# Patient Record
Sex: Male | Born: 1945 | Race: White | Hispanic: No | Marital: Single | Smoking: Former smoker
Health system: Southern US, Community
[De-identification: ages and names within clinical notes are randomized; demographics above are authoritative.]

## PROBLEM LIST (undated history)

## (undated) DIAGNOSIS — I10 Essential (primary) hypertension: Secondary | ICD-10-CM

## (undated) DIAGNOSIS — G8929 Other chronic pain: Secondary | ICD-10-CM

## (undated) DIAGNOSIS — N400 Enlarged prostate without lower urinary tract symptoms: Secondary | ICD-10-CM

## (undated) DIAGNOSIS — F1010 Alcohol abuse, uncomplicated: Secondary | ICD-10-CM

## (undated) DIAGNOSIS — D509 Iron deficiency anemia, unspecified: Secondary | ICD-10-CM

## (undated) DIAGNOSIS — Z978 Presence of other specified devices: Secondary | ICD-10-CM

## (undated) DIAGNOSIS — F419 Anxiety disorder, unspecified: Secondary | ICD-10-CM

## (undated) DIAGNOSIS — Z87898 Personal history of other specified conditions: Secondary | ICD-10-CM

## (undated) DIAGNOSIS — K219 Gastro-esophageal reflux disease without esophagitis: Secondary | ICD-10-CM

## (undated) DIAGNOSIS — K4091 Unilateral inguinal hernia, without obstruction or gangrene, recurrent: Secondary | ICD-10-CM

## (undated) DIAGNOSIS — I2510 Atherosclerotic heart disease of native coronary artery without angina pectoris: Secondary | ICD-10-CM

## (undated) DIAGNOSIS — Z960 Presence of urogenital implants: Secondary | ICD-10-CM

## (undated) DIAGNOSIS — G4700 Insomnia, unspecified: Secondary | ICD-10-CM

## (undated) DIAGNOSIS — T4145XA Adverse effect of unspecified anesthetic, initial encounter: Secondary | ICD-10-CM

## (undated) DIAGNOSIS — R339 Retention of urine, unspecified: Secondary | ICD-10-CM

## (undated) DIAGNOSIS — M549 Dorsalgia, unspecified: Secondary | ICD-10-CM

## (undated) DIAGNOSIS — I5042 Chronic combined systolic (congestive) and diastolic (congestive) heart failure: Secondary | ICD-10-CM

## (undated) DIAGNOSIS — I480 Paroxysmal atrial fibrillation: Secondary | ICD-10-CM

## (undated) DIAGNOSIS — I429 Cardiomyopathy, unspecified: Secondary | ICD-10-CM

## (undated) DIAGNOSIS — R911 Solitary pulmonary nodule: Secondary | ICD-10-CM

## (undated) DIAGNOSIS — I4510 Unspecified right bundle-branch block: Secondary | ICD-10-CM

## (undated) HISTORY — PX: CARDIOVASCULAR STRESS TEST: SHX262

## (undated) HISTORY — DX: Iron deficiency anemia, unspecified: D50.9

## (undated) HISTORY — PX: CATARACT EXTRACTION W/ INTRAOCULAR LENS  IMPLANT, BILATERAL: SHX1307

---

## 1983-12-19 HISTORY — PX: OTHER SURGICAL HISTORY: SHX169

## 1998-12-18 HISTORY — PX: CARDIAC CATHETERIZATION: SHX172

## 1999-07-29 ENCOUNTER — Ambulatory Visit (HOSPITAL_COMMUNITY): Admission: RE | Admit: 1999-07-29 | Discharge: 1999-07-29 | Payer: Self-pay | Admitting: Family Medicine

## 1999-07-29 ENCOUNTER — Encounter: Payer: Self-pay | Admitting: Family Medicine

## 1999-09-10 ENCOUNTER — Inpatient Hospital Stay (HOSPITAL_COMMUNITY): Admission: RE | Admit: 1999-09-10 | Discharge: 1999-09-11 | Payer: Self-pay | Admitting: Cardiovascular Disease

## 2003-01-30 ENCOUNTER — Encounter: Payer: Self-pay | Admitting: Family Medicine

## 2003-01-30 ENCOUNTER — Ambulatory Visit (HOSPITAL_COMMUNITY): Admission: RE | Admit: 2003-01-30 | Discharge: 2003-01-30 | Payer: Self-pay | Admitting: Family Medicine

## 2005-12-18 DIAGNOSIS — T8859XA Other complications of anesthesia, initial encounter: Secondary | ICD-10-CM

## 2005-12-18 DIAGNOSIS — Z87898 Personal history of other specified conditions: Secondary | ICD-10-CM

## 2005-12-18 HISTORY — DX: Other complications of anesthesia, initial encounter: T88.59XA

## 2005-12-18 HISTORY — DX: Personal history of other specified conditions: Z87.898

## 2006-07-19 ENCOUNTER — Ambulatory Visit (HOSPITAL_COMMUNITY): Admission: RE | Admit: 2006-07-19 | Discharge: 2006-07-19 | Payer: Self-pay | Admitting: Surgery

## 2006-07-19 HISTORY — PX: INGUINAL HERNIA REPAIR: SUR1180

## 2006-07-20 ENCOUNTER — Emergency Department (HOSPITAL_COMMUNITY): Admission: EM | Admit: 2006-07-20 | Discharge: 2006-07-21 | Payer: Self-pay | Admitting: Emergency Medicine

## 2006-11-16 ENCOUNTER — Encounter: Admission: RE | Admit: 2006-11-16 | Discharge: 2006-11-16 | Payer: Self-pay | Admitting: Surgery

## 2006-11-16 IMAGING — CT CT ABDOMEN W/ CM
2 of 5 series · 17 of 46 positions shown, 19 images · IV contrast (READICAT/WATER & [ID] OMNI 300)
Comparison: None.

CLINICAL DATA: Abdominal pain.  Evaluate for recurrent hernia.  
 ABDOMEN CT WITH CONTRAST:
TECHNIQUE: Multidetector CT imaging of the abdomen was performed following the standard protocol during bolus administration of intravenous contrast.
 Contrast:  100 cc Omnipaque 300
TECHNIQUE: Multidetector CT imaging of the pelvis was performed following the standard protocol during bolus administration of intravenous contrast.

[Series 3: routine abdomen · axial · 0.70mm/px · z∈[-294,+21]mm · 14 of 71 slices shown, 16 images]
[im 4/71  soft-tissue]
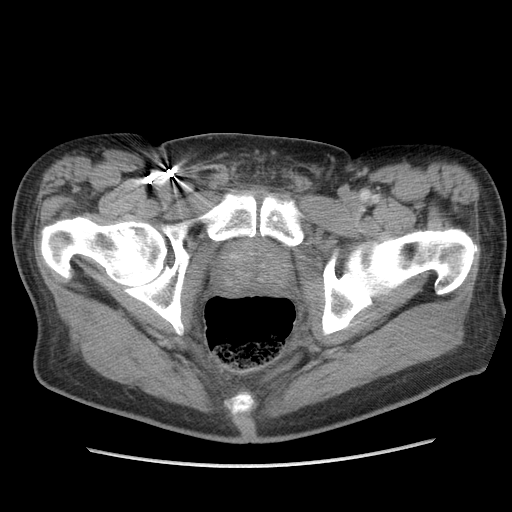
[im 4/71  bone]
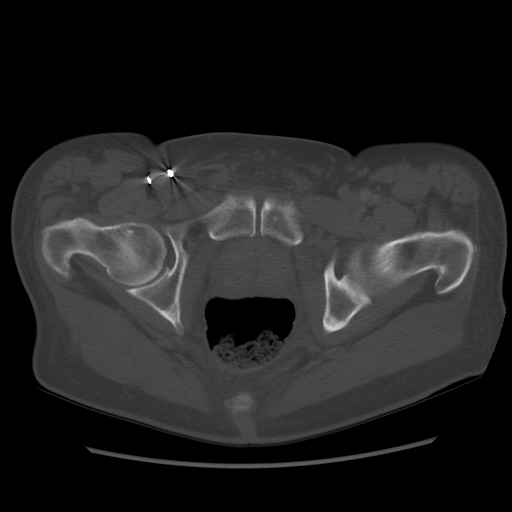
[im 8/71  soft-tissue]
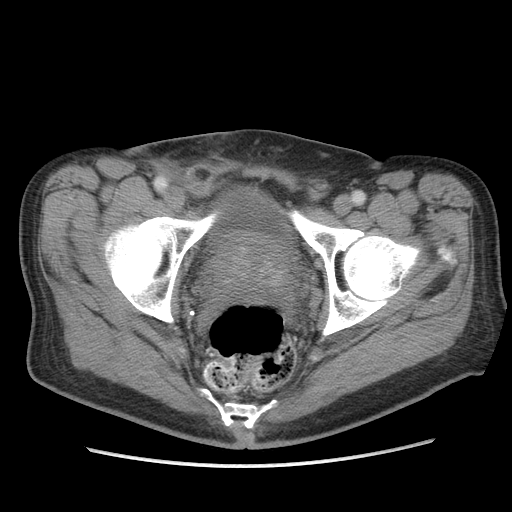
[im 15/71  soft-tissue]
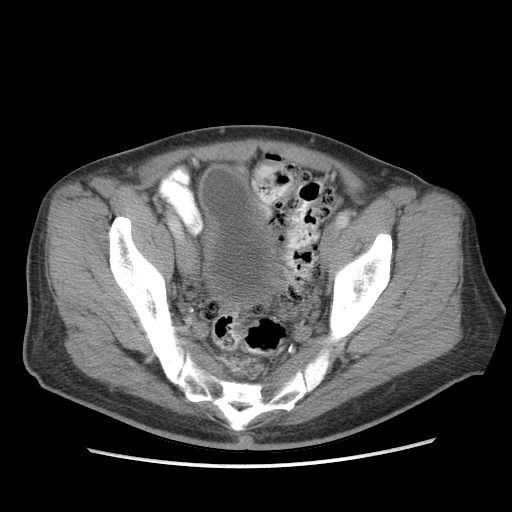
[im 19/71  soft-tissue]
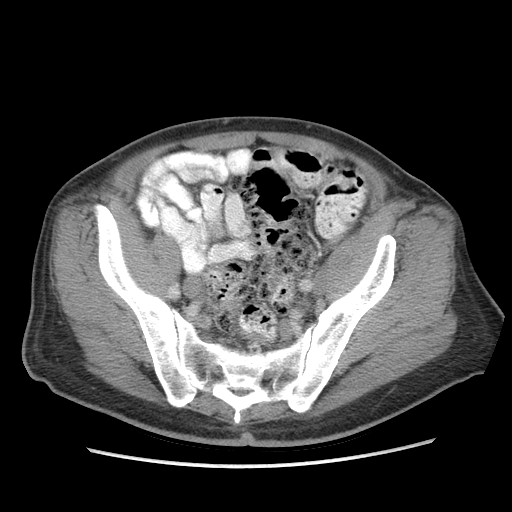
[im 23/71  soft-tissue]
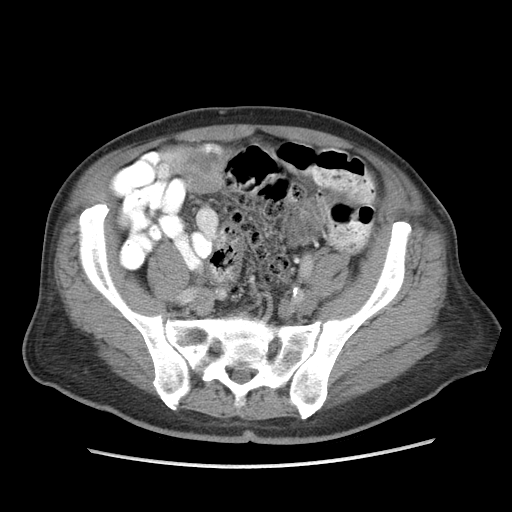
[im 30/71  soft-tissue]
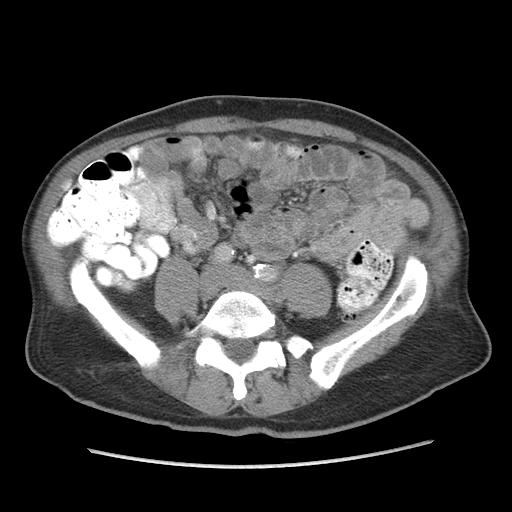
[im 34/71  soft-tissue]
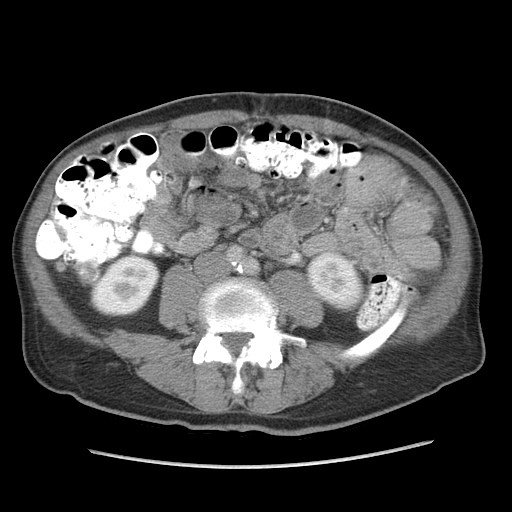
[im 37/71  soft-tissue]
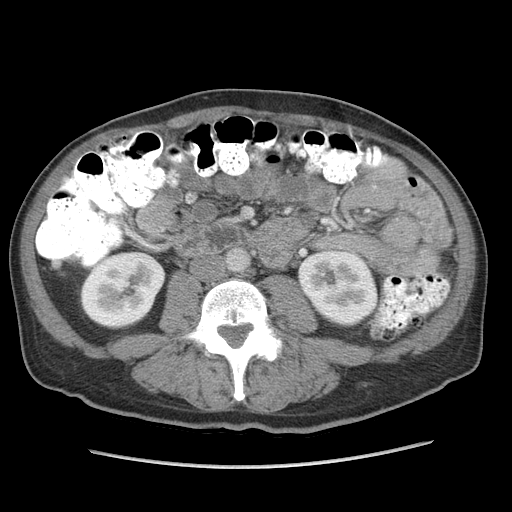
[im 41/71  soft-tissue]
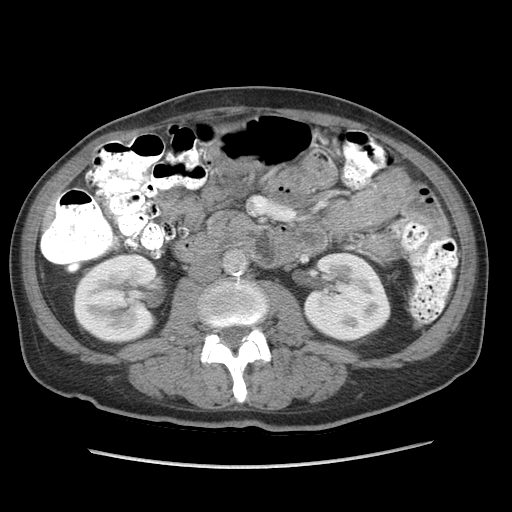
[im 41/71  bone]
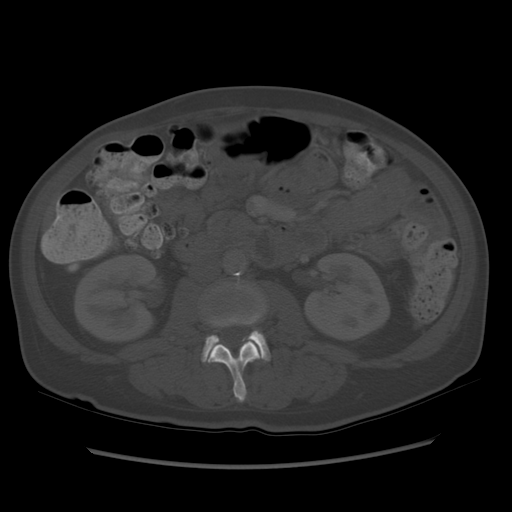
[im 48/71  soft-tissue]
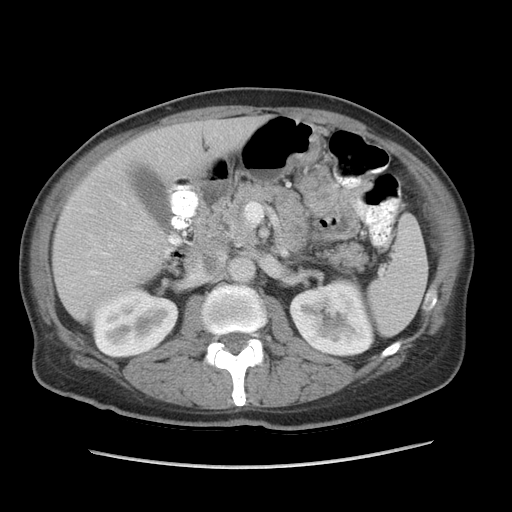
[im 52/71  soft-tissue]
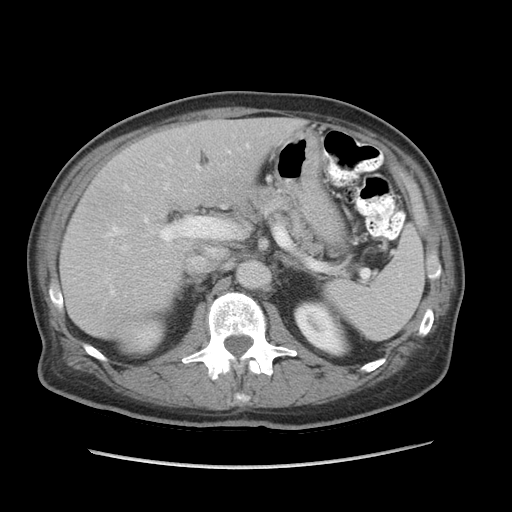
[im 56/71  soft-tissue]
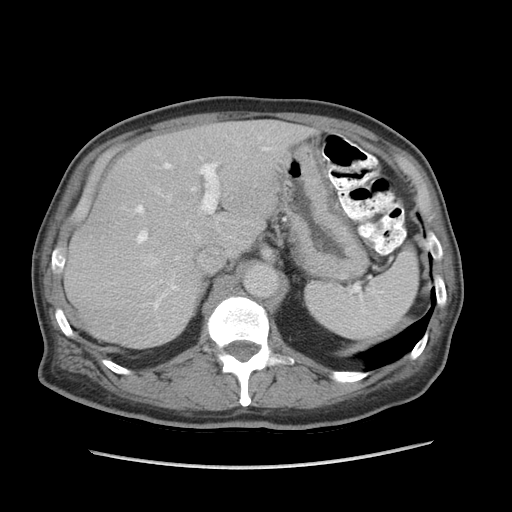
[im 63/71  soft-tissue]
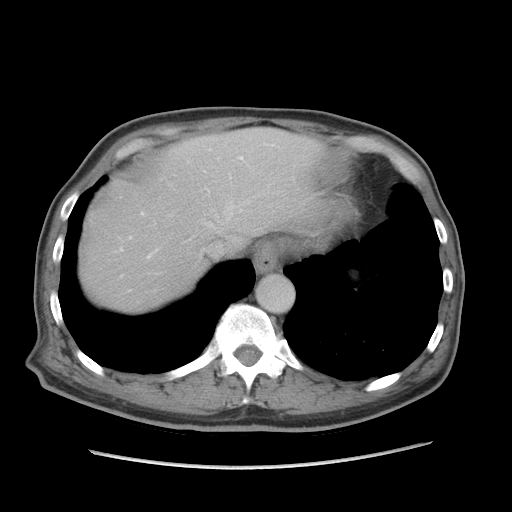
[im 67/71  soft-tissue]
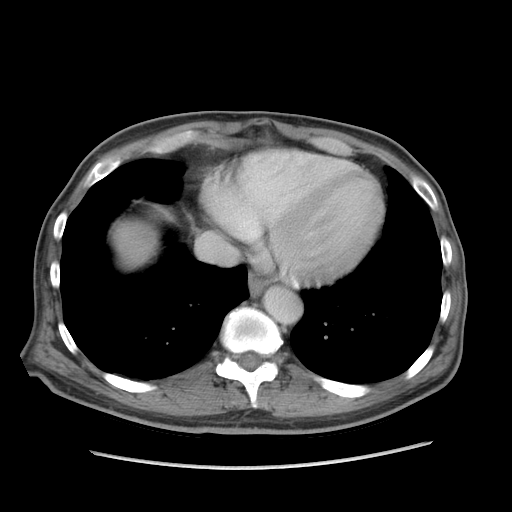

[Series 602: sagittal body · sagittal · 0.76mm/px · 3 of 143 slices shown]
[im 48/143  soft-tissue]
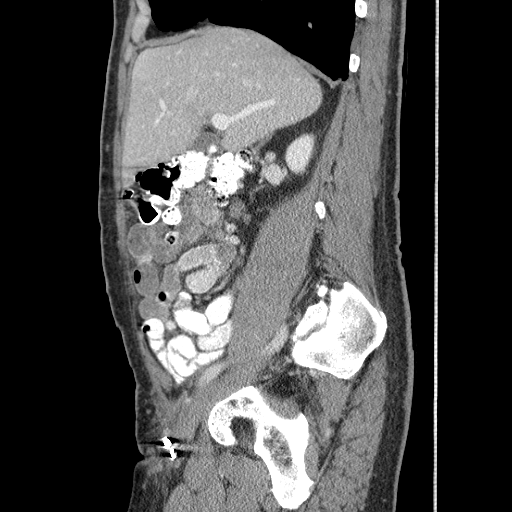
[im 64/143  soft-tissue]
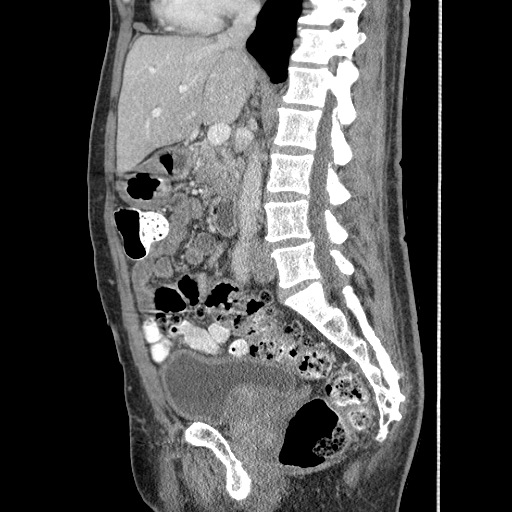
[im 79/143  soft-tissue]
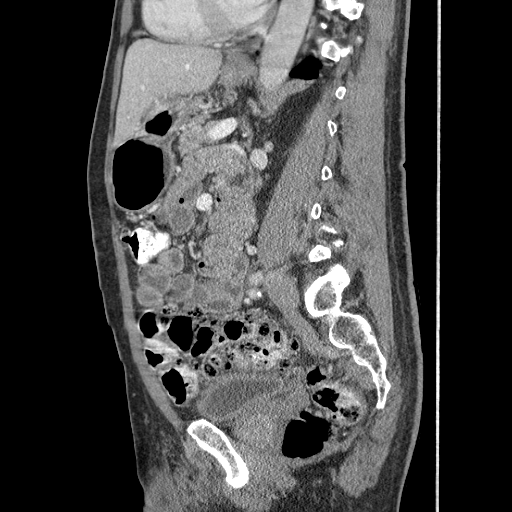

[17 of 46 positions shown; findings below may reference images not displayed]

FINDINGS: Bilateral Bochdalek hernias are noted in the diaphragm posteriorly.  The liver, spleen, pancreas, and kidneys are normal.  The patient is constipated and has diverticulosis.  No acute bowel abnormality is seen.  The appendix is normal.
IMPRESSION: 1.  Bilateral Bochdalek hernias of the diaphragm.  
 2.  Constipation. 
 3.  Diverticulosis of the colon. 
 PELVIS CT WITH CONTRAST:
FINDINGS: The prostate is markedly enlarged measuring 4.4 x 6.6 cm with a large amount of prostate extending into the urinary bladder.  The bladder wall is moderately thickened due to bladder outlet obstruction.  There is no free fluid, mass, or adenopathy.  The bowel is nondilated. 
 Surgical clips are present in the right groin around the common femoral artery.  There is some scar tissue in this area.  Medial to this area there is an inguinal hernia on the right containing abdominal fat.
IMPRESSION: 1.  Right inguinal hernia. 
 2.  Marked prostate enlargement and chronic bladder outlet obstruction.

## 2006-12-03 ENCOUNTER — Ambulatory Visit (HOSPITAL_BASED_OUTPATIENT_CLINIC_OR_DEPARTMENT_OTHER): Admission: RE | Admit: 2006-12-03 | Discharge: 2006-12-03 | Payer: Self-pay | Admitting: Surgery

## 2006-12-03 HISTORY — PX: OTHER SURGICAL HISTORY: SHX169

## 2012-02-06 ENCOUNTER — Other Ambulatory Visit: Payer: Self-pay | Admitting: Orthopedic Surgery

## 2012-03-05 ENCOUNTER — Encounter (HOSPITAL_BASED_OUTPATIENT_CLINIC_OR_DEPARTMENT_OTHER): Payer: Self-pay | Admitting: *Deleted

## 2012-03-06 ENCOUNTER — Encounter (HOSPITAL_BASED_OUTPATIENT_CLINIC_OR_DEPARTMENT_OTHER): Payer: Self-pay | Admitting: *Deleted

## 2012-03-06 NOTE — Progress Notes (Signed)
NPO AFTER MN. ARRIVES AT 1000. NEEDS HG AND EKG. WILL TAKE DIAZEPAM AM OF SURG. W/ SIP OF WATER.

## 2012-03-12 NOTE — H&P (Signed)
  CC- Adrian Ramos is a 66 y.o. male who presents with left knee pain.  HPI- . Knee Pain: Patient presents with knee pain involving the  left knee. Onset of the symptoms was several months ago. Inciting event: injured during a fall while walking. Current symptoms include giving out, pain located medially and stiffness. Pain is aggravated by going up and down stairs, kneeling, rising after sitting and squatting.  Patient has had no prior knee problems. Evaluation to date: MRI: abnormal medial meniscalmtear. Treatment to date: none.  Past Medical History  Diagnosis Date  . Acute medial meniscal injury of knee LEFT  . Anxiety disorder   . Insomnia   . BPH (benign prostatic hypertrophy)   . History of urinary retention 2007-  POST OP  (BPH W/ BLADDER OUTLET OBSTRUCTION)  . SOB (shortness of breath) on exertion   . Complication of anesthesia 2007 POST-OP URINARY RETENTION    Past Surgical History  Procedure Date  . Repair recurrent right inguinal hernia 12-03-2006  . Inguinal hernia repair 07-19-2006    RIGHT  . Benign penile growth removed 1985  . Cardiac catheterization 2000    NORMAL  (FALSE POSITIVE STRESS TEST)      Prior to Admission medications   Medication Sig Start Date End Date Taking? Authorizing Provider  diazepam (VALIUM) 10 MG tablet Take 20 mg by mouth 4 (four) times daily.   Yes Historical Provider, MD  finasteride (PROSCAR) 5 MG tablet Take 5 mg by mouth every evening.   Yes Historical Provider, MD  Tamsulosin HCl (FLOMAX) 0.4 MG CAPS Take 0.4 mg by mouth daily after supper.   Yes Historical Provider, MD  traZODone (DESYREL) 100 MG tablet Take 100 mg by mouth at bedtime.   Yes Historical Provider, MD   KNEE EXAM antalgic gait, soft tissue tenderness over medial joint line, negative drawer sign, collateral ligaments intact, normal ipsilateral hip exam  Physical Examination: General appearance - alert, well appearing, and in no distress Mental status - alert, oriented  to person, place, and time Chest - clear to auscultation, no wheezes, rales or rhonchi, symmetric air entry Heart - normal rate, regular rhythm, normal S1, S2, no murmurs, rubs, clicks or gallops Abdomen - soft, nontender, nondistended, no masses or organomegaly Neurological - alert, oriented, normal speech, no focal findings or movement disorder noted   Asessment/Plan--- Left knee medial meniscal tear- - Plan left knee arthroscopy with meniscal debridement. Procedure risks and potential comps discussed with patient who elects to proceed. Goals are decreased pain and increased function with a high likelihood of achieving both

## 2012-03-13 ENCOUNTER — Encounter (HOSPITAL_BASED_OUTPATIENT_CLINIC_OR_DEPARTMENT_OTHER): Payer: Self-pay | Admitting: Anesthesiology

## 2012-03-13 ENCOUNTER — Ambulatory Visit (HOSPITAL_BASED_OUTPATIENT_CLINIC_OR_DEPARTMENT_OTHER)
Admission: RE | Admit: 2012-03-13 | Discharge: 2012-03-13 | Disposition: A | Discharge status: Home / Self Care | Payer: Medicare Other | Source: Ambulatory Visit | Attending: Orthopedic Surgery | Admitting: Orthopedic Surgery

## 2012-03-13 ENCOUNTER — Other Ambulatory Visit: Payer: Self-pay

## 2012-03-13 ENCOUNTER — Encounter (HOSPITAL_BASED_OUTPATIENT_CLINIC_OR_DEPARTMENT_OTHER)
Admission: RE | Disposition: A | Discharge status: Home / Self Care | Payer: Self-pay | Source: Ambulatory Visit | Attending: Orthopedic Surgery

## 2012-03-13 ENCOUNTER — Encounter (HOSPITAL_BASED_OUTPATIENT_CLINIC_OR_DEPARTMENT_OTHER): Payer: Self-pay | Admitting: *Deleted

## 2012-03-13 ENCOUNTER — Ambulatory Visit (HOSPITAL_BASED_OUTPATIENT_CLINIC_OR_DEPARTMENT_OTHER): Payer: Medicare Other | Admitting: Anesthesiology

## 2012-03-13 DIAGNOSIS — IMO0002 Reserved for concepts with insufficient information to code with codable children: Secondary | ICD-10-CM | POA: Insufficient documentation

## 2012-03-13 DIAGNOSIS — W19XXXA Unspecified fall, initial encounter: Secondary | ICD-10-CM | POA: Insufficient documentation

## 2012-03-13 DIAGNOSIS — Z79899 Other long term (current) drug therapy: Secondary | ICD-10-CM | POA: Insufficient documentation

## 2012-03-13 DIAGNOSIS — Y9301 Activity, walking, marching and hiking: Secondary | ICD-10-CM | POA: Insufficient documentation

## 2012-03-13 DIAGNOSIS — S83249A Other tear of medial meniscus, current injury, unspecified knee, initial encounter: Secondary | ICD-10-CM | POA: Diagnosis present

## 2012-03-13 HISTORY — DX: Insomnia, unspecified: G47.00

## 2012-03-13 HISTORY — DX: Adverse effect of unspecified anesthetic, initial encounter: T41.45XA

## 2012-03-13 HISTORY — DX: Personal history of other specified conditions: Z87.898

## 2012-03-13 HISTORY — PX: KNEE ARTHROSCOPY: SHX127

## 2012-03-13 SURGERY — ARTHROSCOPY, KNEE
Anesthesia: General | Site: Knee | Laterality: Left | Wound class: Clean

## 2012-03-13 MED ORDER — CEFAZOLIN SODIUM-DEXTROSE 2-3 GM-% IV SOLR
2.0000 g | INTRAVENOUS | Status: AC
  Administered 2012-03-13: 2 g via INTRAVENOUS

## 2012-03-13 MED ORDER — DEXAMETHASONE SODIUM PHOSPHATE 10 MG/ML IJ SOLN: 10.0000 mg | Freq: Once | INTRAMUSCULAR | Status: DC

## 2012-03-13 MED ORDER — LIDOCAINE HCL (CARDIAC) 20 MG/ML IV SOLN
INTRAVENOUS | Status: DC | PRN
  Administered 2012-03-13: 50 mg via INTRAVENOUS

## 2012-03-13 MED ORDER — SODIUM CHLORIDE 0.9 % IR SOLN
Status: DC | PRN
  Administered 2012-03-13: 8500 mL

## 2012-03-13 MED ORDER — FENTANYL CITRATE 0.05 MG/ML IJ SOLN
INTRAMUSCULAR | Status: DC | PRN
  Administered 2012-03-13 (×2): 50 ug via INTRAVENOUS

## 2012-03-13 MED ORDER — ONDANSETRON HCL 4 MG/2ML IJ SOLN
INTRAMUSCULAR | Status: DC | PRN
  Administered 2012-03-13: 4 mg via INTRAVENOUS

## 2012-03-13 MED ORDER — KETOROLAC TROMETHAMINE 30 MG/ML IJ SOLN
INTRAMUSCULAR | Status: DC | PRN
  Administered 2012-03-13: 30 mg via INTRAVENOUS

## 2012-03-13 MED ORDER — PROPOFOL 10 MG/ML IV EMUL
INTRAVENOUS | Status: DC | PRN
  Administered 2012-03-13: 200 mg via INTRAVENOUS

## 2012-03-13 MED ORDER — OXYCODONE-ACETAMINOPHEN 5-325 MG PO TABS: 1.0000 | ORAL_TABLET | ORAL | Status: AC | PRN

## 2012-03-13 MED ORDER — MIDAZOLAM HCL 5 MG/5ML IJ SOLN
INTRAMUSCULAR | Status: DC | PRN
  Administered 2012-03-13: 2 mg via INTRAVENOUS

## 2012-03-13 MED ORDER — LACTATED RINGERS IV SOLN
INTRAVENOUS | Status: DC
  Administered 2012-03-13: 11:00:00 via INTRAVENOUS

## 2012-03-13 MED ORDER — FENTANYL CITRATE 0.05 MG/ML IJ SOLN
25.0000 ug | INTRAMUSCULAR | Status: DC | PRN
  Administered 2012-03-13: 25 ug via INTRAVENOUS

## 2012-03-13 MED ORDER — BUPIVACAINE-EPINEPHRINE 0.25% -1:200000 IJ SOLN
INTRAMUSCULAR | Status: DC | PRN
  Administered 2012-03-13: 20 mL

## 2012-03-13 MED ORDER — CHLORHEXIDINE GLUCONATE 4 % EX LIQD: 60.0000 mL | Freq: Once | CUTANEOUS | Status: DC

## 2012-03-13 MED ORDER — ACETAMINOPHEN 10 MG/ML IV SOLN
1000.0000 mg | Freq: Once | INTRAVENOUS | Status: AC
  Administered 2012-03-13: 1000 mg via INTRAVENOUS

## 2012-03-13 MED ORDER — PROMETHAZINE HCL 25 MG/ML IJ SOLN: 6.2500 mg | INTRAMUSCULAR | Status: DC | PRN

## 2012-03-13 MED ORDER — SODIUM CHLORIDE 0.9 % IV SOLN: INTRAVENOUS | Status: DC

## 2012-03-13 MED ORDER — METHOCARBAMOL 500 MG PO TABS: 500.0000 mg | ORAL_TABLET | Freq: Four times a day (QID) | ORAL | Status: AC

## 2012-03-13 MED ORDER — DEXAMETHASONE SODIUM PHOSPHATE 4 MG/ML IJ SOLN
INTRAMUSCULAR | Status: DC | PRN
  Administered 2012-03-13: 10 mg via INTRAVENOUS

## 2012-03-13 SURGICAL SUPPLY — 35 items
BANDAGE ELASTIC 6 VELCRO ST LF (GAUZE/BANDAGES/DRESSINGS) ×2 IMPLANT
BLADE 4.2CUDA (BLADE) ×2 IMPLANT
BLADE CUDA SHAVER 3.5 (BLADE) IMPLANT
BLADE CUTTER GATOR 3.5 (BLADE) IMPLANT
CANISTER SUCT LVC 12 LTR MEDI- (MISCELLANEOUS) ×2 IMPLANT
CANISTER SUCTION 2500CC (MISCELLANEOUS) IMPLANT
CLOTH BEACON ORANGE TIMEOUT ST (SAFETY) ×2 IMPLANT
DRAPE ARTHROSCOPY W/POUCH 114 (DRAPES) ×2 IMPLANT
DRSG EMULSION OIL 3X3 NADH (GAUZE/BANDAGES/DRESSINGS) ×2 IMPLANT
DURAPREP 26ML APPLICATOR (WOUND CARE) ×2 IMPLANT
ELECT MENISCUS 165MM 90D (ELECTRODE) IMPLANT
ELECT REM PT RETURN 9FT ADLT (ELECTROSURGICAL) ×2
ELECTRODE REM PT RTRN 9FT ADLT (ELECTROSURGICAL) IMPLANT
GLOVE BIO SURGEON STRL SZ8 (GLOVE) ×2 IMPLANT
GLOVE BIOGEL PI IND STRL 6.5 (GLOVE) IMPLANT
GLOVE BIOGEL PI INDICATOR 6.5 (GLOVE) ×2
GLOVE ECLIPSE 6.5 STRL STRAW (GLOVE) ×2 IMPLANT
GLOVE INDICATOR 8.0 STRL GRN (GLOVE) ×2 IMPLANT
GOWN PREVENTION PLUS LG XLONG (DISPOSABLE) ×5 IMPLANT
IV NS IRRIG 3000ML ARTHROMATIC (IV SOLUTION) ×2 IMPLANT
KNEE WRAP E Z 3 GEL PACK (MISCELLANEOUS) ×2 IMPLANT
PACK ARTHROSCOPY DSU (CUSTOM PROCEDURE TRAY) ×2 IMPLANT
PACK BASIN DAY SURGERY FS (CUSTOM PROCEDURE TRAY) ×2 IMPLANT
PADDING CAST ABS 4INX4YD NS (CAST SUPPLIES) ×1
PADDING CAST ABS COTTON 4X4 ST (CAST SUPPLIES) ×1 IMPLANT
PADDING CAST COTTON 6X4 STRL (CAST SUPPLIES) ×2 IMPLANT
PENCIL BUTTON HOLSTER BLD 10FT (ELECTRODE) IMPLANT
SET ARTHROSCOPY TUBING (MISCELLANEOUS) ×2
SET ARTHROSCOPY TUBING LN (MISCELLANEOUS) ×1 IMPLANT
SPONGE GAUZE 4X4 12PLY (GAUZE/BANDAGES/DRESSINGS) ×2 IMPLANT
SUT ETHILON 4 0 PS 2 18 (SUTURE) ×2 IMPLANT
TOWEL OR 17X24 6PK STRL BLUE (TOWEL DISPOSABLE) ×2 IMPLANT
WAND 30 DEG SABER W/CORD (SURGICAL WAND) IMPLANT
WAND 90 DEG TURBOVAC W/CORD (SURGICAL WAND) ×1 IMPLANT
WATER STERILE IRR 500ML POUR (IV SOLUTION) ×2 IMPLANT

## 2012-03-13 NOTE — Op Note (Signed)
Preoperative diagnosis-  Left knee medial meniscal tear  Postoperative diagnosis Left- knee medial meniscal tear   Procedure- Left knee arthroscopy with medial  Meniscal debridement    Surgeon- Adrian Rankin. Zanayah Shadowens, MD  Anesthesia-General  EBL-  minimal Complications- None  Condition- PACU - hemodynamically stable.  Brief clinical note- -Adrian Ramos is a 66 y.o.  male with a several month history of left knee pain and mechanical symptoms. Exam and history suggested medial meniscal tear confirmed by MRI. The patient presents now for arthroscopy and debridement   Procedure in detail -       After successful administration of General anesthetic, a tourmiquet is placed high on the Left  thigh and the Left lower extremity is prepped and draped in the usual sterile fashion. Time out is performed by the surgical team. Standard superomedial and inferolateral portal sites are marked and incisions made with an 11 blade. The inflow cannula is passed through the superomedial portal and camera through the inferolateral portal and inflow is initiated. Arthroscopic visualization proceeds.      The undersurface of the patella and trochlea are visualized and they appear normal. The medial and lateral gutters are visualized and there are  no loose bodies. Flexion and valgus force is applied to the knee and the medial compartment is entered. A spinal needle is passed into the joint through the site marked for the inferomedial portal. A small incision is made and the dilator passed into the joint. The findings for the medial compartment are tear of body and posterior horn of the medial meniscus. The chondral surfaces appear normal. The tear is debrided to a stable base with baskets and a shaver and sealed off with the Arthrocare.      The intercondylar notch is visualized and the ACL appears normal. The lateral compartment is entered and the findings are normal.     The joint is again inspected and there are no  other tears, defects or loose bodies identified. The arthroscopic equipment is then removed from the inferior portals which are closed with interrupted 4-0 nylon. 20 ml of .25% Marcaine with epinephrine are injected through the inflow cannula and the cannula is then removed and the portal closed with nylon. The incisions are cleaned and dried and a bulky sterile dressing is applied. The patient is then awakened and transported to recovery in stable condition.   03/13/2012, 12:11 PM

## 2012-03-13 NOTE — Interval H&P Note (Signed)
History and Physical Interval Note:  03/13/2012 11:33 AM  Adrian Ramos  has presented today for surgery, with the diagnosis of LEFT KNEE MEDIAL MENISCAL TEAR  The various methods of treatment have been discussed with the patient and family. After consideration of risks, benefits and other options for treatment, the patient has consented to  Procedure(s) (LRB): ARTHROSCOPY KNEE (Left) as a surgical intervention .  The patients' history has been reviewed, patient examined, no change in status, stable for surgery.  I have reviewed the patients' chart and labs.  Questions were answered to the patient's satisfaction.     Loanne Drilling

## 2012-03-13 NOTE — Anesthesia Postprocedure Evaluation (Signed)
  Anesthesia Post-op Note  Patient: Adrian Ramos  Procedure(s) Performed: Procedure(s) (LRB): ARTHROSCOPY KNEE (Left)  Patient Location: PACU  Anesthesia Type: General  Level of Consciousness: awake and alert   Airway and Oxygen Therapy: Patient Spontanous Breathing  Post-op Pain: mild  Post-op Assessment: Post-op Vital signs reviewed, Patient's Cardiovascular Status Stable, Respiratory Function Stable, Patent Airway and No signs of Nausea or vomiting  Post-op Vital Signs: stable  Complications: No apparent anesthesia complications. Sinus arrhythmia with no symptoms.  Discussed with patient.

## 2012-03-13 NOTE — Discharge Instructions (Signed)
Arthroscopic Procedure, Knee An arthroscopic procedure can find what is wrong with your knee. PROCEDURE Arthroscopy is a surgical technique that allows your orthopedic surgeon to diagnose and treat your knee injury with accuracy. They will look into your knee through a small instrument. This is almost like a small (pencil sized) telescope. Because arthroscopy affects your knee less than open knee surgery, you can anticipate a more rapid recovery. Taking an active role by following your caregiver's instructions will help with rapid and complete recovery. Use crutches, rest, elevation, ice, and knee exercises as instructed. The length of recovery depends on various factors including type of injury, age, physical condition, medical conditions, and your rehabilitation. Your knee is the joint between the large bones (femur and tibia) in your leg. Cartilage covers these bone ends which are smooth and slippery and allow your knee to bend and move smoothly. Two menisci, thick, semi-lunar shaped pads of cartilage which form a rim inside the joint, help absorb shock and stabilize your knee. Ligaments bind the bones together and support your knee joint. Muscles move the joint, help support your knee, and take stress off the joint itself. Because of this all programs and physical therapy to rehabilitate an injured or repaired knee require rebuilding and strengthening your muscles. AFTER THE PROCEDURE  After the procedure, you will be moved to a recovery area until most of the effects of the medication have worn off. Your caregiver will discuss the test results with you.   Only take over-the-counter or prescription medicines for pain, discomfort, or fever as directed by your caregiver.  SEEK MEDICAL CARE IF:   You have increased bleeding from your wounds.   You see redness, swelling, or have increasing pain in your wounds.   You have pus coming from your wound.   You have an oral temperature above 102 F (38.9  C).   You notice a bad smell coming from the wound or dressing.   You have severe pain with any motion of your knee.  SEEK IMMEDIATE MEDICAL CARE IF:   You develop a rash.   You have difficulty breathing.   You have any allergic problems.  Document Released: 12/01/2000 Document Revised: 11/23/2011 Document Reviewed: 06/24/2008 [facility] Patient Information 2012 [place], [place].Arthroscopic Procedure, Knee An arthroscopic procedure can find what is wrong with your knee. PROCEDURE Arthroscopy is a surgical technique that allows your orthopedic surgeon to diagnose and treat your knee injury with accuracy. They will look into your knee through a small instrument. This is almost like a small (pencil sized) telescope. Because arthroscopy affects your knee less than open knee surgery, you can anticipate a more rapid recovery. Taking an active role by following your caregiver's instructions will help with rapid and complete recovery. Use crutches, rest, elevation, ice, and knee exercises as instructed. The length of recovery depends on various factors including type of injury, age, physical condition, medical conditions, and your rehabilitation. Your knee is the joint between the large bones (femur and tibia) in your leg. Cartilage covers these bone ends which are smooth and slippery and allow your knee to bend and move smoothly. Two menisci, thick, semi-lunar shaped pads of cartilage which form a rim inside the joint, help absorb shock and stabilize your knee. Ligaments bind the bones together and support your knee joint. Muscles move the joint, help support your knee, and take stress off the joint itself. Because of this all programs and physical therapy to rehabilitate an injured or repaired knee require rebuilding and  strengthening your muscles. AFTER THE PROCEDURE  After the procedure, you will be moved to a recovery area until most of the effects of the medication have worn off. Your caregiver will  discuss the test results with you.   Only take over-the-counter or prescription medicines for pain, discomfort, or fever as directed by your caregiver.  SEEK MEDICAL CARE IF:   You have increased bleeding from your wounds.   You see redness, swelling, or have increasing pain in your wounds.   You have pus coming from your wound.   You have an oral temperature above 102 F (38.9 C).   You notice a bad smell coming from the wound or dressing.   You have severe pain with any motion of your knee.  SEEK IMMEDIATE MEDICAL CARE IF:   You develop a rash.   You have difficulty breathing.   You have any allergic problems.  Document Released: 12/01/2000 Document Revised: 11/23/2011 Document Reviewed: 06/24/2008 [facility] Patient Information 2012 [place], [place].

## 2012-03-13 NOTE — Transfer of Care (Signed)
Immediate Anesthesia Transfer of Care Note  Patient: Adrian Ramos  Procedure(s) Performed: Procedure(s) (LRB): ARTHROSCOPY KNEE (Left)  Patient Location: PACU  Anesthesia Type: General  Level of Consciousness: awake and alert   Airway & Oxygen Therapy: Patient Spontanous Breathing and Patient connected to nasal cannula oxygen  Post-op Assessment: Report given to PACU RN  Post vital signs: Reviewed and stable  Complications: No apparent anesthesia complications

## 2012-03-13 NOTE — Anesthesia Preprocedure Evaluation (Signed)
Anesthesia Evaluation  Patient identified by MRN, date of birth, ID band Patient awake    Reviewed: Allergy & Precautions, H&P , NPO status , Patient's Chart, lab work & pertinent test results  History of Anesthesia Complications (+) DIFFICULT AIRWAY  Airway Mallampati: II TM Distance: >3 FB Neck ROM: Full    Dental No notable dental hx.    Pulmonary shortness of breath, Current Smoker,  breath sounds clear to auscultation  Pulmonary exam normal       Cardiovascular negative cardio ROS  Rhythm:Regular Rate:Normal     Neuro/Psych PSYCHIATRIC DISORDERS Anxiety negative neurological ROS     GI/Hepatic negative GI ROS, Neg liver ROS,   Endo/Other  negative endocrine ROS  Renal/GU negative Renal ROS  negative genitourinary   Musculoskeletal negative musculoskeletal ROS (+)   Abdominal (+) + obese,   Peds negative pediatric ROS (+)  Hematology negative hematology ROS (+)   Anesthesia Other Findings   Reproductive/Obstetrics negative OB ROS                           Anesthesia Physical Anesthesia Plan  ASA: II  Anesthesia Plan: General   Post-op Pain Management:    Induction: Intravenous  Airway Management Planned: LMA  Additional Equipment:   Intra-op Plan:   Post-operative Plan: Extubation in OR  Informed Consent: I have reviewed the patients History and Physical, chart, labs and discussed the procedure including the risks, benefits and alternatives for the proposed anesthesia with the patient or authorized representative who has indicated his/her understanding and acceptance.   Dental advisory given  Plan Discussed with: CRNA  Anesthesia Plan Comments:         Anesthesia Quick Evaluation

## 2012-03-13 NOTE — Anesthesia Procedure Notes (Signed)
Procedure Name: LMA Insertion Performed by: Kaizley Aja T Pre-anesthesia Checklist: Patient identified, Emergency Drugs available, Suction available and Patient being monitored Patient Re-evaluated:Patient Re-evaluated prior to inductionOxygen Delivery Method: Circle System Utilized Preoxygenation: Pre-oxygenation with 100% oxygen Intubation Type: IV induction Ventilation: Mask ventilation without difficulty LMA: LMA inserted LMA Size: 4.0 Number of attempts: 1 Airway Equipment and Method: bite block Placement Confirmation: positive ETCO2 Dental Injury: Teeth and Oropharynx as per pre-operative assessment      

## 2012-03-14 ENCOUNTER — Encounter (HOSPITAL_BASED_OUTPATIENT_CLINIC_OR_DEPARTMENT_OTHER): Payer: Self-pay | Admitting: Orthopedic Surgery

## 2012-03-19 ENCOUNTER — Encounter (HOSPITAL_BASED_OUTPATIENT_CLINIC_OR_DEPARTMENT_OTHER): Payer: Self-pay

## 2016-08-03 ENCOUNTER — Other Ambulatory Visit: Payer: Self-pay | Admitting: Family Medicine

## 2016-08-03 ENCOUNTER — Other Ambulatory Visit: Payer: Self-pay | Admitting: General Surgery

## 2016-08-03 DIAGNOSIS — R1031 Right lower quadrant pain: Secondary | ICD-10-CM

## 2016-08-11 ENCOUNTER — Ambulatory Visit
Admission: RE | Admit: 2016-08-11 | Discharge: 2016-08-11 | Disposition: A | Discharge status: Home / Self Care | Payer: Medicare Other | Source: Ambulatory Visit | Attending: General Surgery | Admitting: General Surgery

## 2016-08-11 DIAGNOSIS — R1031 Right lower quadrant pain: Secondary | ICD-10-CM

## 2016-08-11 MED ORDER — IOPAMIDOL (ISOVUE-300) INJECTION 61%: 125.0000 mL | Freq: Once | INTRAVENOUS | Status: DC | PRN

## 2016-08-14 ENCOUNTER — Ambulatory Visit: Payer: Self-pay | Admitting: General Surgery

## 2016-08-14 NOTE — H&P (Signed)
Adrian Ramos 08/02/2016 10:26 AM Location: [facility] Patient #: [id number] DOB: 03/08/1946 Single / Language: [place] / Race: White Male   History of Present Illness Adrian Ramos(Adrian Ramos J. Maximillion Gill MD; 08/02/2016 10:53 AM) Patient words: new pt, RIH.  The patient is a 70 year old male.  Note:He was referred by Dr. Retta Dionesahlstedt for consultation regarding right groin pain and possible recurrent inguinal hernia (second recurrence). He underwent right inguinal hernia repair with mesh in 2007 and had a quick recurrent hernia and underwent a second repair with mesh in 2007. He is to develop some chronic right groin discomfort and fullness. He sees Dr. Retta Dionesahlstedt for his BPH which is well controlled medically. He was concerned about a recurrent inguinal hernia.  Other Problems (April Staton, CMA; 08/02/2016 10:26 AM) Alcohol Abuse Anxiety Disorder Enlarged Prostate Inguinal Hernia  Past Surgical History (April Staton, CMA; 08/02/2016 10:26 AM) Cataract Surgery Bilateral. Knee Surgery Left. Open Inguinal Hernia Surgery Right. multiple  Diagnostic Studies History (April Staton, CMA; 08/02/2016 10:26 AM) Colonoscopy >10 years ago  Allergies (April Staton, CMA; 08/02/2016 10:26 AM) No Known Drug Allergies08/16/2017  Medication History (April Staton, CMA; 08/02/2016 10:27 AM) Finasteride (5MG  Tablet, Oral) Active. DiazePAM (10MG  Tablet, Oral) Active. TraZODone HCl (100MG  Tablet, Oral) Active. Medications Reconciled  Social History (April Staton, CMA; 08/02/2016 10:26 AM) Alcohol use Heavy alcohol use. No caffeine use No drug use Tobacco use Former smoker.  Family History (April Staton, [place]; 08/02/2016 10:26 AM) Cancer Mother. Cerebrovascular Accident Mother. Depression Father. Heart Disease Father. Hypertension Father. Respiratory Condition Father.    Review of Systems (April Staton CMA; 08/02/2016 10:26 AM) General Present- Weight Gain. Not Present- Appetite  Loss, Chills, Fatigue, Fever, Night Sweats and Weight Loss. Skin Not Present- Change in Wart/Mole, Dryness, Hives, Jaundice, New Lesions, Non-Healing Wounds, Rash and Ulcer. HEENT Present- Visual Disturbances. Not Present- Earache, Hearing Loss, Hoarseness, Nose Bleed, Oral Ulcers, Ringing in the Ears, Seasonal Allergies, Sinus Pain, Sore Throat, Wears glasses/contact lenses and Yellow Eyes. Respiratory Not Present- Bloody sputum, Chronic Cough, Difficulty Breathing, Snoring and Wheezing. Breast Not Present- Breast Mass, Breast Pain, Nipple Discharge and Skin Changes. Cardiovascular Present- Shortness of Breath. Not Present- Chest Pain, Difficulty Breathing Lying Down, Leg Cramps, Palpitations, Rapid Heart Rate and Swelling of Extremities. Gastrointestinal Not Present- Abdominal Pain, Bloating, Bloody Stool, Change in Bowel Habits, Chronic diarrhea, Constipation, Difficulty Swallowing, Excessive gas, Gets full quickly at meals, Hemorrhoids, Indigestion, Nausea, Rectal Pain and Vomiting. Male Genitourinary Not Present- Blood in Urine, Change in Urinary Stream, Frequency, Impotence, Nocturia, Painful Urination, Urgency and Urine Leakage. Musculoskeletal Not Present- Back Pain, Joint Pain, Joint Stiffness, Muscle Pain, Muscle Weakness and Swelling of Extremities. Neurological Not Present- Decreased Memory, Fainting, Headaches, Numbness, Seizures, Tingling, Tremor, Trouble walking and Weakness. Psychiatric Present- Anxiety. Not Present- Bipolar, Change in Sleep Pattern, Depression, Fearful and Frequent crying. Endocrine Not Present- Cold Intolerance, Excessive Hunger, Hair Changes, Heat Intolerance, Hot flashes and New Diabetes. Hematology Not Present- Blood Thinners, Easy Bruising, Excessive bleeding, Gland problems, HIV and Persistent Infections.  Vitals (April Staton CMA; 08/02/2016 10:28 AM) 08/02/2016 10:27 AM Weight: 205.5 lb Height: 64in Height was reported by patient. Body Surface Area: 1.98  m Body Mass Index: 35.27 kg/m  Temp.: 63F(Oral)  Pulse: 54 (Regular)  P.OX: 97% (Room air) BP: 118/70 (Sitting, Left Arm, Standard)       Physical Exam Adrian Ramos(Adrian Ramos J. Talli Kimmer MD; 08/02/2016 10:55 AM) The physical exam findings are as follows: Note:General: Obese male in NAD. Pleasant and cooperative.  CV: RRR,  no murmur, no edema  CHEST: Breath sounds equal and clear. Respirations nonlabored.  ABDOMEN: Soft, obese, nontender, no umbilical hernia  GU: Right inguinal fullness with scar. No left inguinal fullness or bulge. Exam difficult due to body habitus.  SKIN: No jaundice.  NEUROLOGIC: Alert and oriented, answers questions appropriately, normal gait and station.  PSYCHIATRIC: Normal mood, affect , and behavior.    Assessment & Plan Adrian Pollack MD; 08/02/2016 10:52 AM) RIGHT GROIN PAIN (R10.31) Impression: There was a fullness on exam and CT demonstrated a recurrent right inguinal hernia.  Plan: Laparoscopic repair with mesh. I have explained the procedure, risks, and aftercare of inguinal hernia repair. Risks include but are not limited to bleeding, infection, wound problems, anesthesia, recurrence, bladder or intestine injury, urinary retention, testicular dysfunction, chronic pain, mesh problems. He seems to understand the plan and agrees with the plan.

## 2016-10-03 ENCOUNTER — Encounter (INDEPENDENT_AMBULATORY_CARE_PROVIDER_SITE_OTHER): Payer: Self-pay

## 2016-10-03 ENCOUNTER — Encounter (HOSPITAL_COMMUNITY)
Admission: RE | Admit: 2016-10-03 | Discharge: 2016-10-03 | Disposition: A | Discharge status: Home / Self Care | Payer: Medicare Other | Source: Ambulatory Visit | Attending: General Surgery | Admitting: General Surgery

## 2016-10-03 ENCOUNTER — Encounter (HOSPITAL_COMMUNITY): Payer: Self-pay

## 2016-10-03 DIAGNOSIS — Z01818 Encounter for other preprocedural examination: Secondary | ICD-10-CM | POA: Insufficient documentation

## 2016-10-03 HISTORY — DX: Anxiety disorder, unspecified: F41.9

## 2016-10-03 LAB — CBC
HEMATOCRIT: 45.8 % (ref 39.0–52.0)
HEMOGLOBIN: 15.1 g/dL (ref 13.0–17.0)
MCH: 30.9 pg (ref 26.0–34.0)
MCHC: 33 g/dL (ref 30.0–36.0)
MCV: 93.7 fL (ref 78.0–100.0)
Platelets: 152 10*3/uL (ref 150–400)
RBC: 4.89 MIL/uL (ref 4.22–5.81)
RDW: 13.2 % (ref 11.5–15.5)
WBC: 7 10*3/uL (ref 4.0–10.5)

## 2016-10-03 LAB — COMPREHENSIVE METABOLIC PANEL
ALBUMIN: 4.5 g/dL (ref 3.5–5.0)
ALK PHOS: 53 U/L (ref 38–126)
ALT: 16 U/L — AB (ref 17–63)
ANION GAP: 9 (ref 5–15)
AST: 20 U/L (ref 15–41)
BILIRUBIN TOTAL: 0.6 mg/dL (ref 0.3–1.2)
BUN: 8 mg/dL (ref 6–20)
CALCIUM: 9.2 mg/dL (ref 8.9–10.3)
CO2: 27 mmol/L (ref 22–32)
CREATININE: 0.73 mg/dL (ref 0.61–1.24)
Chloride: 104 mmol/L (ref 101–111)
GFR calc Af Amer: >60 mL/min (ref >60)
GFR calc non Af Amer: >60 mL/min (ref >60)
GLUCOSE: 113 mg/dL — AB (ref 65–99)
Potassium: 4.5 mmol/L (ref 3.5–5.1)
Sodium: 140 mmol/L (ref 135–145)
TOTAL PROTEIN: 7.2 g/dL (ref 6.5–8.1)

## 2016-10-03 NOTE — Patient Instructions (Signed)
Drucilla ChaletMark I Hollings  10/03/2016   Your procedure is scheduled on: Friday October 13, 2016  [facility]  Entrance take [place]  elevators to 3rd floor to  Short Stay Center at 5:30 AM.  Call this number if you have problems the morning of surgery [phone]   Remember: ONLY 1 PERSON MAY GO WITH YOU TO SHORT STAY TO GET  READY MORNING OF YOUR SURGERY.  Do not eat food or drink liquids :After Midnight.     Take these medicines the morning of surgery with A SIP OF WATER: Diazepam (Valium) if needed                                 You may not have any metal on your body including hair pins and              piercings  Do not wear jewelr, lotions, powders or colognes, deodorant                       Men may shave face and neck.   Do not bring valuables to the hospital. [facility] IS NOT             RESPONSIBLE   FOR VALUABLES.  Contacts, dentures or bridgework may not be worn into surgery.      Patients discharged the day of surgery will not be allowed to drive home.  Name and phone number of your driver:Eric Gerarda GuntherSchaefer (Friend)  _____________________________________________________________________             [facility] - Preparing [facility] Before surgery, you can play an important role.  Because skin is not sterile, your skin needs to be as free of germs as possible.  You can reduce the number of germs on your skin by washing with CHG (chlorahexidine gluconate) soap before surgery.  CHG is an antiseptic cleaner which kills germs and bonds with the skin to continue killing germs even after washing. Please DO NOT use if you have an allergy to CHG or antibacterial soaps.  If your skin becomes reddened/irritated stop using the CHG and inform your nurse when you arrive at Short Stay. Do not shave (including legs and underarms) for at least 48 hours prior to the first CHG shower.  You may shave your face/neck. Please follow these instructions  carefully:  1.  Shower with CHG Soap the night before surgery and the  morning of Surgery.  2.  If you choose to wash your hair, wash your hair first as usual with your  normal  shampoo.  3.  After you shampoo, rinse your hair and body thoroughly to remove the  shampoo.                           4.  Use CHG as you would any other liquid soap.  You can apply chg directly  to the skin and wash                       Gently with a scrungie or clean washcloth.  5.  Apply the CHG Soap to your body ONLY FROM THE NECK DOWN.   Do not use on face/ open  Wound or open sores. Avoid contact with eyes, ears mouth and genitals (private parts).                       Wash face,  Genitals (private parts) with your normal soap.             6.  Wash thoroughly, paying special attention to the area where your surgery  will be performed.  7.  Thoroughly rinse your body with warm water from the neck down.  8.  DO NOT shower/wash with your normal soap after using and rinsing off  the CHG Soap.                9.  Pat yourself dry with a clean towel.            10.  Wear clean pajamas.            11.  Place clean sheets on your bed the night of your first shower and do not  sleep with pets. Day of Surgery : Do not apply any lotions/deodorants the morning of surgery.  Please wear clean clothes to the hospital/surgery center.  FAILURE TO FOLLOW THESE INSTRUCTIONS MAY RESULT IN THE CANCELLATION OF YOUR SURGERY PATIENT SIGNATURE_________________________________  NURSE SIGNATURE__________________________________  ________________________________________________________________________

## 2016-10-03 NOTE — Progress Notes (Signed)
Dr Jenell MillinerSinger/anesthesia reviewed pts EKG / epic from today 10/03/2016. Requesting cardiac clearance. This nurse spoke with Toniann FailWendy / [facility] triage in regards to this. To notify Dr Abbey Chattersosenbower.

## 2016-10-04 ENCOUNTER — Encounter: Payer: Self-pay | Admitting: Cardiology

## 2016-10-10 ENCOUNTER — Ambulatory Visit (INDEPENDENT_AMBULATORY_CARE_PROVIDER_SITE_OTHER): Payer: Medicare Other | Admitting: Cardiology

## 2016-10-10 ENCOUNTER — Encounter: Payer: Self-pay | Admitting: Cardiology

## 2016-10-10 ENCOUNTER — Inpatient Hospital Stay (HOSPITAL_COMMUNITY)
Admission: AD | Admit: 2016-10-10 | Discharge: 2016-10-16 | DRG: 308 | Disposition: A | Discharge status: Home / Self Care | Payer: Medicare Other | Source: Ambulatory Visit | Attending: Cardiology | Admitting: Cardiology

## 2016-10-10 ENCOUNTER — Encounter (HOSPITAL_COMMUNITY): Payer: Self-pay | Admitting: General Practice

## 2016-10-10 VITALS — BP 134/88 | HR 57 | Ht 63.0 in | Wt 208.4 lb

## 2016-10-10 DIAGNOSIS — Z91041 Radiographic dye allergy status: Secondary | ICD-10-CM

## 2016-10-10 DIAGNOSIS — I481 Persistent atrial fibrillation: Secondary | ICD-10-CM

## 2016-10-10 DIAGNOSIS — Z818 Family history of other mental and behavioral disorders: Secondary | ICD-10-CM

## 2016-10-10 DIAGNOSIS — R911 Solitary pulmonary nodule: Secondary | ICD-10-CM | POA: Diagnosis present

## 2016-10-10 DIAGNOSIS — Z9849 Cataract extraction status, unspecified eye: Secondary | ICD-10-CM

## 2016-10-10 DIAGNOSIS — K4090 Unilateral inguinal hernia, without obstruction or gangrene, not specified as recurrent: Secondary | ICD-10-CM | POA: Insufficient documentation

## 2016-10-10 DIAGNOSIS — R001 Bradycardia, unspecified: Secondary | ICD-10-CM | POA: Diagnosis not present

## 2016-10-10 DIAGNOSIS — Z6836 Body mass index (BMI) 36.0-36.9, adult: Secondary | ICD-10-CM

## 2016-10-10 DIAGNOSIS — K4091 Unilateral inguinal hernia, without obstruction or gangrene, recurrent: Secondary | ICD-10-CM

## 2016-10-10 DIAGNOSIS — Z79899 Other long term (current) drug therapy: Secondary | ICD-10-CM

## 2016-10-10 DIAGNOSIS — I5041 Acute combined systolic (congestive) and diastolic (congestive) heart failure: Secondary | ICD-10-CM | POA: Diagnosis present

## 2016-10-10 DIAGNOSIS — R0602 Shortness of breath: Secondary | ICD-10-CM | POA: Diagnosis present

## 2016-10-10 DIAGNOSIS — I4891 Unspecified atrial fibrillation: Secondary | ICD-10-CM

## 2016-10-10 DIAGNOSIS — F1010 Alcohol abuse, uncomplicated: Secondary | ICD-10-CM | POA: Diagnosis present

## 2016-10-10 DIAGNOSIS — Z01818 Encounter for other preprocedural examination: Secondary | ICD-10-CM | POA: Insufficient documentation

## 2016-10-10 DIAGNOSIS — I700 Atherosclerosis of aorta: Secondary | ICD-10-CM | POA: Diagnosis present

## 2016-10-10 DIAGNOSIS — M5489 Other dorsalgia: Secondary | ICD-10-CM | POA: Diagnosis present

## 2016-10-10 DIAGNOSIS — I4819 Other persistent atrial fibrillation: Secondary | ICD-10-CM

## 2016-10-10 DIAGNOSIS — Z8249 Family history of ischemic heart disease and other diseases of the circulatory system: Secondary | ICD-10-CM

## 2016-10-10 DIAGNOSIS — F419 Anxiety disorder, unspecified: Secondary | ICD-10-CM | POA: Diagnosis present

## 2016-10-10 DIAGNOSIS — I480 Paroxysmal atrial fibrillation: Secondary | ICD-10-CM | POA: Diagnosis present

## 2016-10-10 DIAGNOSIS — I441 Atrioventricular block, second degree: Secondary | ICD-10-CM | POA: Diagnosis not present

## 2016-10-10 DIAGNOSIS — I2510 Atherosclerotic heart disease of native coronary artery without angina pectoris: Secondary | ICD-10-CM | POA: Diagnosis present

## 2016-10-10 DIAGNOSIS — I420 Dilated cardiomyopathy: Secondary | ICD-10-CM | POA: Diagnosis present

## 2016-10-10 DIAGNOSIS — Z87891 Personal history of nicotine dependence: Secondary | ICD-10-CM

## 2016-10-10 DIAGNOSIS — I110 Hypertensive heart disease with heart failure: Secondary | ICD-10-CM | POA: Diagnosis present

## 2016-10-10 DIAGNOSIS — Z7901 Long term (current) use of anticoagulants: Secondary | ICD-10-CM

## 2016-10-10 DIAGNOSIS — R9439 Abnormal result of other cardiovascular function study: Secondary | ICD-10-CM

## 2016-10-10 HISTORY — DX: Solitary pulmonary nodule: R91.1

## 2016-10-10 HISTORY — DX: Essential (primary) hypertension: I10

## 2016-10-10 LAB — COMPREHENSIVE METABOLIC PANEL
ALK PHOS: 52 U/L (ref 38–126)
ALT: 26 U/L (ref 17–63)
ANION GAP: 10 (ref 5–15)
AST: 25 U/L (ref 15–41)
Albumin: 3.6 g/dL (ref 3.5–5.0)
BILIRUBIN TOTAL: 0.7 mg/dL (ref 0.3–1.2)
BUN: 5 mg/dL — ABNORMAL LOW (ref 6–20)
CALCIUM: 8.8 mg/dL — AB (ref 8.9–10.3)
CO2: 24 mmol/L (ref 22–32)
Chloride: 105 mmol/L (ref 101–111)
Creatinine, Ser: 0.63 mg/dL (ref 0.61–1.24)
Glucose, Bld: 123 mg/dL — ABNORMAL HIGH (ref 65–99)
Potassium: 4 mmol/L (ref 3.5–5.1)
SODIUM: 139 mmol/L (ref 135–145)
TOTAL PROTEIN: 6.3 g/dL — AB (ref 6.5–8.1)

## 2016-10-10 LAB — CBC
HCT: 42.3 % (ref 39.0–52.0)
HEMOGLOBIN: 14.5 g/dL (ref 13.0–17.0)
MCH: 31.1 pg (ref 26.0–34.0)
MCHC: 34.3 g/dL (ref 30.0–36.0)
MCV: 90.8 fL (ref 78.0–100.0)
PLATELETS: 156 10*3/uL (ref 150–400)
RBC: 4.66 MIL/uL (ref 4.22–5.81)
RDW: 13.1 % (ref 11.5–15.5)
WBC: 9.7 10*3/uL (ref 4.0–10.5)

## 2016-10-10 LAB — TSH: TSH: 1.17 u[IU]/mL (ref 0.350–4.500)

## 2016-10-10 MED ORDER — HEPARIN (PORCINE) IN NACL 100-0.45 UNIT/ML-% IJ SOLN
1100.0000 [IU]/h | INTRAMUSCULAR | Status: DC
  Administered 2016-10-10 – 2016-10-11 (×2): 1100 [IU]/h via INTRAVENOUS
  Filled 2016-10-10 (×2): qty 250

## 2016-10-10 MED ORDER — DILTIAZEM LOAD VIA INFUSION
15.0000 mg | Freq: Once | INTRAVENOUS | Status: AC
  Administered 2016-10-10: 15 mg via INTRAVENOUS
  Filled 2016-10-10: qty 15

## 2016-10-10 MED ORDER — FINASTERIDE 5 MG PO TABS
5.0000 mg | ORAL_TABLET | Freq: Every evening | ORAL | Status: DC
  Administered 2016-10-10 – 2016-10-16 (×7): 5 mg via ORAL
  Filled 2016-10-10 (×7): qty 1

## 2016-10-10 MED ORDER — RIVAROXABAN 20 MG PO TABS: 20.0000 mg | ORAL_TABLET | Freq: Every day | ORAL | 11 refills | Status: DC

## 2016-10-10 MED ORDER — VITAMIN B-12 100 MCG PO TABS
100.0000 ug | ORAL_TABLET | Freq: Every day | ORAL | Status: DC
  Administered 2016-10-10 – 2016-10-16 (×5): 100 ug via ORAL
  Filled 2016-10-10 (×8): qty 1

## 2016-10-10 MED ORDER — DILTIAZEM HCL-DEXTROSE 100-5 MG/100ML-% IV SOLN (PREMIX)
5.0000 mg/h | INTRAVENOUS | Status: DC
  Administered 2016-10-10 – 2016-10-11 (×2): 5 mg/h via INTRAVENOUS
  Filled 2016-10-10: qty 500
  Filled 2016-10-10 (×2): qty 100

## 2016-10-10 MED ORDER — OMEGA 3 1000 MG PO CAPS: 1000.0000 mg | ORAL_CAPSULE | Freq: Every day | ORAL | Status: DC

## 2016-10-10 MED ORDER — HEPARIN BOLUS VIA INFUSION
3500.0000 [IU] | Freq: Once | INTRAVENOUS | Status: AC
  Administered 2016-10-10: 3500 [IU] via INTRAVENOUS
  Filled 2016-10-10: qty 3500

## 2016-10-10 MED ORDER — VITAMIN D3 25 MCG (1000 UNIT) PO TABS
1000.0000 [IU] | ORAL_TABLET | Freq: Every day | ORAL | Status: DC
  Administered 2016-10-10 – 2016-10-16 (×7): 1000 [IU] via ORAL
  Filled 2016-10-10 (×14): qty 1

## 2016-10-10 MED ORDER — ONDANSETRON HCL 4 MG/2ML IJ SOLN: 4.0000 mg | Freq: Four times a day (QID) | INTRAMUSCULAR | Status: DC | PRN

## 2016-10-10 MED ORDER — TRAZODONE HCL 100 MG PO TABS
200.0000 mg | ORAL_TABLET | Freq: Every day | ORAL | Status: DC
  Administered 2016-10-10 – 2016-10-15 (×6): 200 mg via ORAL
  Filled 2016-10-10 (×6): qty 2

## 2016-10-10 MED ORDER — OMEGA-3-ACID ETHYL ESTERS 1 G PO CAPS
1.0000 g | ORAL_CAPSULE | Freq: Every day | ORAL | Status: DC
  Administered 2016-10-10 – 2016-10-16 (×7): 1 g via ORAL
  Filled 2016-10-10 (×7): qty 1

## 2016-10-10 MED ORDER — ACETAMINOPHEN 325 MG PO TABS: 650.0000 mg | ORAL_TABLET | ORAL | Status: DC | PRN

## 2016-10-10 NOTE — Patient Instructions (Addendum)
Medication Instructions:  1) START XARELTO 20 mg daily  Labwork: None  Testing/Procedures: Your physician has requested that you have a lexiscan myoview. For further information please visit [email or web address]. [email or web address] follow instruction sheet, as given.  Follow-Up: Your physician recommends that you schedule a follow-up appointment in 4 weeks with Dr. Norris Crossurner's assistant.  Your physician wants you to follow-up in: 6 months with Dr. Mayford Knifeurner. You will receive a reminder letter in the mail two months in advance. If you don't receive a letter, please call our office to schedule the follow-up appointment.   Any Other Special Instructions Will Be Listed Below (If Applicable). YOU ARE BEING ADMITTED TO [facility]. PLEASE REPORT TO THE [facility], MAIN ENTRANCE A AND THEY WILL SHOW YOU TO YOUR ROOM.    If you need a refill on your cardiac medications before your next appointment, please call your pharmacy.

## 2016-10-10 NOTE — Progress Notes (Signed)
ANTICOAGULATION CONSULT NOTE - Initial Consult  Pharmacy Consult:  Heparin Indication: atrial fibrillation  Allergies  Allergen Reactions  . Iodinated Diagnostic Agents Hives    Developed hives 1 hour after scanning needs 13 hour pre med prior to scan    Patient Measurements: Height = 63 inches Weight = 94.5 kg Heparin Dosing Weight: 78 kg  Vital Signs: Temp: 98.2 F (36.8 C) (10/24 1558) Temp Source: Oral (10/24 1558) BP: 128/91 (10/24 1558) Pulse Rate: 96 (10/24 1558)  Labs: No results for input(s): HGB, HCT, PLT, APTT, LABPROT, INR, HEPARINUNFRC, HEPRLOWMOCWT, CREATININE, CKTOTAL, CKMB, TROPONINI in the last 72 hours.  Estimated Creatinine Clearance: 86.1 mL/min (by C-G formula based on SCr of 0.73 mg/dL).   Medical History: Past Medical History:  Diagnosis Date  . Acute medial meniscal injury of knee LEFT  . Anxiety   . Anxiety disorder   . Back pain    right side; occurs more with movement  . BPH (benign prostatic hypertrophy)   . Chronic midline back pain    "right side; last 6 wks" (10/10/2016)  . Complication of anesthesia 2007   POST-OP URINARY RETENTION  . Dyspnea    on exertion;   . History of urinary retention 2007   POST OP  (BPH W/ BLADDER OUTLET OBSTRUCTION)  . Hypertension   . Insomnia   . Iritis   . SOB (shortness of breath) on exertion       Assessment: [age] YOM admitted for cardiac clearance prior to right inguinal hernia repair.  Patient was noted to be in Afib with RVR on 01/04/16.  Pharmacy consulted to initiate IV heparin.  Today's labs are pending; labs from 10/03/16 reviewed.  Patient reports not being on any blood thinners PTA.   Goal of Therapy:  Heparin level 0.3-0.7 units/ml Monitor platelets by anticoagulation protocol: Yes    Plan:  - Heparin 3500 units IV bolus x 1, then - Heparin gtt at 1100 units/hr - Check 8 hr heparin level - Daily heparin level and CBC   Sheritha Louis D. Laney Potashang, PharmD, BCPS Pager:  [phone] 10/10/2016,  5:46 PM

## 2016-10-10 NOTE — H&P (Signed)
Cardiology Office Note    Date:  10/10/2016   ID:  Adrian Ramos, DOB [birth year], MRN [id number]  PCP:  No PCP Per Patient      Cardiologist:  Armanda Magic, MD      Chief Complaint  Patient presents with  . Shortness of Breath    History of Present Illness:  Adrian Ramos is a 69 y.o. male with a history of SOB who is referred for cardiac surgical clearance prior to undergoing right inguinal hernia repair.  He has no history of cardiac disease but has a family history.  He has a remote history of tobacco use and alcohol use.  He denies any chest pain, dizziness, LE edema, claudication, dizziness or syncope. He had surgery on his knee about 5 years ago and since then has not been able to exercise and has gotten deconditioned and has recently noticed DOE. He thinks that once in a while he has some mild LE edema when sitting for long periods of time. He was noted by his PCP to be in atrial fibrillation with RVR on 10/17 and is now here for evaulation.          Past Medical History:  Diagnosis Date  . Acute medial meniscal injury of knee LEFT  . Anxiety   . Anxiety disorder   . Back pain    right side; occurs more with movement  . BPH (benign prostatic hypertrophy)   . Complication of anesthesia 2007 POST-OP URINARY RETENTION  . Dyspnea    on exertion;   . History of urinary retention 2007-  POST OP  (BPH W/ BLADDER OUTLET OBSTRUCTION)  . Insomnia   . Iritis   . SOB (shortness of breath) on exertion          Past Surgical History:  Procedure Laterality Date  . BENIGN PENILE GROWTH REMOVED  1985  . CARDIAC CATHETERIZATION  2000   NORMAL  (FALSE POSITIVE STRESS TEST)    . EYE SURGERY     cataract surgery bilateral  . INGUINAL HERNIA REPAIR  07-19-2006   RIGHT  . KNEE ARTHROSCOPY  03/13/2012   Procedure: ARTHROSCOPY KNEE;  Surgeon: Loanne Drilling, MD;  Location: [facility];  Service: Orthopedics;  Laterality: Left;  WITH  DEBRIDEMENT medial meniscus  . REPAIR RECURRENT RIGHT INGUINAL HERNIA  12-03-2006    Current Medications:       Outpatient Medications Prior to Visit  Medication Sig Dispense Refill  . Ascorbic Acid (VITAMIN C) 1000 MG tablet Take 1,000 mg by mouth daily.    . chlorhexidine (PERIDEX) 0.12 % solution 10 mLs by Mouth Rinse route 2 (two) times daily as needed (mouth pain).   3  . Cholecalciferol (VITAMIN D3 PO) Take 1 tablet by mouth daily with supper.    . Coenzyme Q10 (COQ-10 PO) Take 1 capsule by mouth daily with supper.    . diazepam (VALIUM) 10 MG tablet Take 10-20 mg by mouth 4 (four) times daily as needed.     . finasteride (PROSCAR) 5 MG tablet Take 5 mg by mouth every evening.    Marland Kitchen MILK THISTLE PO Take 1 tablet by mouth daily with supper.    . Omega-3 Fatty Acids (OMEGA 3 PO) Take 1 capsule by mouth daily with supper.    . terbinafine (LAMISIL) 1 % cream Apply 1 application topically 2 (two) times daily as needed.    . traZODone (DESYREL) 100 MG tablet Take 200 mg by mouth at  bedtime.     . Vitamin A-Beta Carotene 82956-213010000-1000 units TABS Take 1 tablet by mouth daily.    . vitamin B-12 (CYANOCOBALAMIN) 100 MCG tablet Take 100 mcg by mouth daily.    . vitamin E 400 UNIT capsule Take 400 Units by mouth 2 (two) times daily.    . Probiotic Product (PROBIOTIC DAILY PO) Take 1 capsule by mouth daily with supper.     No facility-administered medications prior to visit.      Allergies:   Iodinated diagnostic agents   Social History        Social History  . Marital status: Single    Spouse name: N/A  . Number of children: N/A  . Years of education: N/A         Social History Main Topics  . Smoking status: Former Smoker    Years: 20.00    Types: Cigars  . Smokeless tobacco: Former NeurosurgeonUser    Types: Chew    Quit date: 12/18/1996     Comment: 2 CIGARS DAILY  . Alcohol use Yes     Comment: pt states heavy drinker till aprox 3 wks ago  has totally quit currently   . Drug use: No  . Sexual activity: Not Asked       Other Topics Concern  . None      Social History Narrative  . None     Family History:  The patient's family history includes Cancer in his mother; Cerebrovascular Accident in his mother; Depression in his father; Heart disease in his father; Hypertension in his father.   ROS:   Please see the history of present illness.    ROS All other systems reviewed and are negative.  No flowsheet data found.     PHYSICAL EXAM:   VS:  BP 134/88   Pulse (!) 57   Ht 5\' 3"  (1.6 m)   Wt 208 lb 6.4 oz (94.5 kg)   SpO2 97%   BMI 36.92 kg/m    GEN: Well nourished, well developed, in no acute distress  HEENT: normal  Neck: no JVD, carotid bruits, or masses Cardiac: RRR; no murmurs, rubs, or gallops,no edema.  Intact distal pulses bilaterally.  Respiratory:  clear to auscultation bilaterally, normal work of breathing GI: soft, nontender, nondistended, + BS MS: no deformity or atrophy  Skin: warm and dry, no rash Neuro:  Alert and Oriented x 3, Strength and sensation are intact Psych: euthymic mood, full affect     Wt Readings from Last 3 Encounters:  10/10/16 208 lb 6.4 oz (94.5 kg)  10/03/16 203 lb 2 oz (92.1 kg)  03/06/12 215 lb (97.5 kg)      Studies/Labs Reviewed:   EKG:  EKG is ordered today.  The ekg ordered today demonstrates atrial fibrillation with RVR at 146bpm  Recent Labs: 10/03/2016: ALT 16; BUN 8; Creatinine, Ser 0.73; Hemoglobin 15.1; Platelets 152; Potassium 4.5; Sodium 140   Lipid Panel Labs (Brief)  No results found for: CHOL, TRIG, HDL, CHOLHDL, VLDL, LDLCALC, LDLDIRECT    Additional studies/ records that were reviewed today include:  Surgery office notes    ASSESSMENT:    1. Persistent atrial fibrillation (HCC)   2. SOB (shortness of breath)   3. Preoperative clearance   4. Unilateral recurrent inguinal hernia without obstruction or gangrene       PLAN:  In order of problems listed above:  1. Persistent atrial fibrillation - HR still rapid.  His CHADS2VASC score is 1 but  due to persistence of atrial fibrillation will start Xarelto 20mg  daily.  Given how fast his afib is and that he is symptomatic, will admit to tele bed and start IV Cardizem gtt.  Will get 2D echo in hospital.  Check TSH.   2. SOB - ? Etiology. Most likely related to afib with RVR.   He does have a remote history of smoking and family history of CAD so need to consider underlying CAD.  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  Will check a 2D echo to assess LVF and get a stress myoview to rule out ischemia. 3. Preoperative cardiac clearance 4. Inguinal hernia needing repair. Given new onset afib, he will need to wait at least 4 weeks after DCCV to come off of NOAC.    Medication Adjustments/Labs and Tests Ordered: Current medicines are reviewed at length with the patient today.  Concerns regarding medicines are outlined above.  Medication changes, Labs and Tests ordered today are listed in the Patient Instructions below.  There are no Patient Instructions on file for this visit.   Signed, Armanda Magic, MD  10/10/2016 2:18 PM    [facility] [address] [place], [place], [place]  [address] Phone: [phone]; Fax: [phone]      Electronically signed by Quintella Reichert, MD at 10/10/2016 3:01 PM

## 2016-10-10 NOTE — Progress Notes (Signed)
Will start heparin per pharmacy until echo and Myoview done. Then switch to Xarelto 20mg  qd.

## 2016-10-10 NOTE — Progress Notes (Signed)
Cardiology Office Note    Date:  10/10/2016   ID:  Adrian Ramos, DOB Jul 28, 1946, MRN [id number]  PCP:  No PCP Per Patient  Cardiologist:  Armanda Magic, MD   Chief Complaint  Patient presents with  . Shortness of Breath    History of Present Illness:  Adrian Ramos is a 70 y.o. male with a history of SOB who is referred for cardiac surgical clearance prior to undergoing right inguinal hernia repair.  He has no history of cardiac disease but has a family history.  He has a remote history of tobacco use and alcohol use.  He denies any chest pain, dizziness, LE edema, claudication, dizziness or syncope. He had surgery on his knee about 5 years ago and since then has not been able to exercise and has gotten deconditioned and has recently noticed DOE. He thinks that once in a while he has some mild LE edema when sitting for long periods of time. He was noted by his PCP to be in atrial fibrillation with RVR on 10/17 and is now here for evaulation.      Past Medical History:  Diagnosis Date  . Acute medial meniscal injury of knee LEFT  . Anxiety   . Anxiety disorder   . Back pain    right side; occurs more with movement  . BPH (benign prostatic hypertrophy)   . Complication of anesthesia 2007 POST-OP URINARY RETENTION  . Dyspnea    on exertion;   . History of urinary retention 2007-  POST OP  (BPH W/ BLADDER OUTLET OBSTRUCTION)  . Insomnia   . Iritis   . SOB (shortness of breath) on exertion     Past Surgical History:  Procedure Laterality Date  . BENIGN PENILE GROWTH REMOVED  1985  . CARDIAC CATHETERIZATION  2000   NORMAL  (FALSE POSITIVE STRESS TEST)    . EYE SURGERY     cataract surgery bilateral  . INGUINAL HERNIA REPAIR  07-19-2006   RIGHT  . KNEE ARTHROSCOPY  03/13/2012   Procedure: ARTHROSCOPY KNEE;  Surgeon: Loanne Drilling, MD;  Location: [facility];  Service: Orthopedics;  Laterality: Left;  WITH DEBRIDEMENT medial meniscus  . REPAIR RECURRENT  RIGHT INGUINAL HERNIA  12-03-2006    Current Medications: Outpatient Medications Prior to Visit  Medication Sig Dispense Refill  . Ascorbic Acid (VITAMIN C) 1000 MG tablet Take 1,000 mg by mouth daily.    . chlorhexidine (PERIDEX) 0.12 % solution 10 mLs by Mouth Rinse route 2 (two) times daily as needed (mouth pain).   3  . Cholecalciferol (VITAMIN D3 PO) Take 1 tablet by mouth daily with supper.    . Coenzyme Q10 (COQ-10 PO) Take 1 capsule by mouth daily with supper.    . diazepam (VALIUM) 10 MG tablet Take 10-20 mg by mouth 4 (four) times daily as needed.     . finasteride (PROSCAR) 5 MG tablet Take 5 mg by mouth every evening.    Marland Kitchen MILK THISTLE PO Take 1 tablet by mouth daily with supper.    . Omega-3 Fatty Acids (OMEGA 3 PO) Take 1 capsule by mouth daily with supper.    . terbinafine (LAMISIL) 1 % cream Apply 1 application topically 2 (two) times daily as needed.    . traZODone (DESYREL) 100 MG tablet Take 200 mg by mouth at bedtime.     . Vitamin A-Beta Carotene [address] units TABS Take 1 tablet by mouth daily.    Marland Kitchen  vitamin B-12 (CYANOCOBALAMIN) 100 MCG tablet Take 100 mcg by mouth daily.    . vitamin E 400 UNIT capsule Take 400 Units by mouth 2 (two) times daily.    . Probiotic Product (PROBIOTIC DAILY PO) Take 1 capsule by mouth daily with supper.     No facility-administered medications prior to visit.      Allergies:   Iodinated diagnostic agents   Social History   Social History  . Marital status: Single    Spouse name: N/A  . Number of children: N/A  . Years of education: N/A   Social History Main Topics  . Smoking status: Former Smoker    Years: 20.00    Types: Cigars  . Smokeless tobacco: Former NeurosurgeonUser    Types: Chew    Quit date: 12/18/1996     Comment: 2 CIGARS DAILY  . Alcohol use Yes     Comment: pt states heavy drinker till aprox 3 wks ago has totally quit currently   . Drug use: No  . Sexual activity: Not Asked   Other Topics Concern  . None   Social  History Narrative  . None     Family History:  The patient's family history includes Cancer in his mother; Cerebrovascular Accident in his mother; Depression in his father; Heart disease in his father; Hypertension in his father.   ROS:   Please see the history of present illness.    ROS All other systems reviewed and are negative.  No flowsheet data found.     PHYSICAL EXAM:   VS:  BP 134/88   Pulse (!) 57   Ht 5\' 3"  (1.6 m)   Wt 208 lb 6.4 oz (94.5 kg)   SpO2 97%   BMI 36.92 kg/m    GEN: Well nourished, well developed, in no acute distress  HEENT: normal  Neck: no JVD, carotid bruits, or masses Cardiac: RRR; no murmurs, rubs, or gallops,no edema.  Intact distal pulses bilaterally.  Respiratory:  clear to auscultation bilaterally, normal work of breathing GI: soft, nontender, nondistended, + BS MS: no deformity or atrophy  Skin: warm and dry, no rash Neuro:  Alert and Oriented x 3, Strength and sensation are intact Psych: euthymic mood, full affect  Wt Readings from Last 3 Encounters:  10/10/16 208 lb 6.4 oz (94.5 kg)  10/03/16 203 lb 2 oz (92.1 kg)  03/06/12 215 lb (97.5 kg)      Studies/Labs Reviewed:   EKG:  EKG is ordered today.  The ekg ordered today demonstrates atrial fibrillation with RVR at 146bpm  Recent Labs: 10/03/2016: ALT 16; BUN 8; Creatinine, Ser 0.73; Hemoglobin 15.1; Platelets 152; Potassium 4.5; Sodium 140   Lipid Panel No results found for: CHOL, TRIG, HDL, CHOLHDL, VLDL, LDLCALC, LDLDIRECT  Additional studies/ records that were reviewed today include:  Surgery office notes    ASSESSMENT:    1. Persistent atrial fibrillation (HCC)   2. SOB (shortness of breath)   3. Preoperative clearance   4. Unilateral recurrent inguinal hernia without obstruction or gangrene      PLAN:  In order of problems listed above:  1. Persistent atrial fibrillation - HR still rapid.  His CHADS2VASC score is 1 but due to persistence of atrial  fibrillation will start Xarelto 20mg  daily.  Given how fast his afib is and that he is symptomatic, will admit to tele bed and start IV Cardizem gtt.  Will get 2D echo in hospital.  Check TSH.   2. SOB - ?  Etiology. Most likely related to afib with RVR.   He does have a remote history of smoking and family history of CAD so need to consider underlying CAD.  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  Will check a 2D echo to assess LVF and get a stress myoview to rule out ischemia. 3. Preoperative cardiac clearance 4. Inguinal hernia needing repair. Given new onset afib, he will need to wait at least 4 weeks after DCCV to come off of NOAC.    Medication Adjustments/Labs and Tests Ordered: Current medicines are reviewed at length with the patient today.  Concerns regarding medicines are outlined above.  Medication changes, Labs and Tests ordered today are listed in the Patient Instructions below.  There are no Patient Instructions on file for this visit.   Signed, Armanda Magic, MD  10/10/2016 2:18 PM    [facility] [address] [place], [place], [place]  [address] Phone: [phone]; Fax: [phone]

## 2016-10-11 ENCOUNTER — Observation Stay (HOSPITAL_COMMUNITY): Payer: Medicare Other

## 2016-10-11 ENCOUNTER — Telehealth: Payer: Self-pay

## 2016-10-11 ENCOUNTER — Other Ambulatory Visit: Payer: Self-pay

## 2016-10-11 DIAGNOSIS — F419 Anxiety disorder, unspecified: Secondary | ICD-10-CM | POA: Diagnosis present

## 2016-10-11 DIAGNOSIS — I5041 Acute combined systolic (congestive) and diastolic (congestive) heart failure: Secondary | ICD-10-CM | POA: Diagnosis present

## 2016-10-11 DIAGNOSIS — R9439 Abnormal result of other cardiovascular function study: Secondary | ICD-10-CM | POA: Diagnosis not present

## 2016-10-11 DIAGNOSIS — Z79899 Other long term (current) drug therapy: Secondary | ICD-10-CM | POA: Diagnosis not present

## 2016-10-11 DIAGNOSIS — I110 Hypertensive heart disease with heart failure: Secondary | ICD-10-CM | POA: Diagnosis present

## 2016-10-11 DIAGNOSIS — F1010 Alcohol abuse, uncomplicated: Secondary | ICD-10-CM | POA: Diagnosis present

## 2016-10-11 DIAGNOSIS — I4891 Unspecified atrial fibrillation: Secondary | ICD-10-CM

## 2016-10-11 DIAGNOSIS — Z6836 Body mass index (BMI) 36.0-36.9, adult: Secondary | ICD-10-CM | POA: Diagnosis not present

## 2016-10-11 DIAGNOSIS — Z818 Family history of other mental and behavioral disorders: Secondary | ICD-10-CM | POA: Diagnosis not present

## 2016-10-11 DIAGNOSIS — I2510 Atherosclerotic heart disease of native coronary artery without angina pectoris: Secondary | ICD-10-CM | POA: Diagnosis present

## 2016-10-11 DIAGNOSIS — Z91041 Radiographic dye allergy status: Secondary | ICD-10-CM | POA: Diagnosis not present

## 2016-10-11 DIAGNOSIS — I441 Atrioventricular block, second degree: Secondary | ICD-10-CM | POA: Diagnosis not present

## 2016-10-11 DIAGNOSIS — R0602 Shortness of breath: Secondary | ICD-10-CM

## 2016-10-11 DIAGNOSIS — Z87891 Personal history of nicotine dependence: Secondary | ICD-10-CM | POA: Diagnosis not present

## 2016-10-11 DIAGNOSIS — Z8249 Family history of ischemic heart disease and other diseases of the circulatory system: Secondary | ICD-10-CM | POA: Diagnosis not present

## 2016-10-11 DIAGNOSIS — I420 Dilated cardiomyopathy: Secondary | ICD-10-CM | POA: Diagnosis present

## 2016-10-11 DIAGNOSIS — I481 Persistent atrial fibrillation: Secondary | ICD-10-CM | POA: Diagnosis present

## 2016-10-11 DIAGNOSIS — R911 Solitary pulmonary nodule: Secondary | ICD-10-CM | POA: Diagnosis present

## 2016-10-11 DIAGNOSIS — K4090 Unilateral inguinal hernia, without obstruction or gangrene, not specified as recurrent: Secondary | ICD-10-CM | POA: Diagnosis present

## 2016-10-11 DIAGNOSIS — M5489 Other dorsalgia: Secondary | ICD-10-CM | POA: Diagnosis present

## 2016-10-11 DIAGNOSIS — R001 Bradycardia, unspecified: Secondary | ICD-10-CM | POA: Diagnosis not present

## 2016-10-11 DIAGNOSIS — I700 Atherosclerosis of aorta: Secondary | ICD-10-CM | POA: Diagnosis present

## 2016-10-11 DIAGNOSIS — Z01818 Encounter for other preprocedural examination: Secondary | ICD-10-CM | POA: Diagnosis not present

## 2016-10-11 DIAGNOSIS — Z9849 Cataract extraction status, unspecified eye: Secondary | ICD-10-CM | POA: Diagnosis not present

## 2016-10-11 DIAGNOSIS — Z7901 Long term (current) use of anticoagulants: Secondary | ICD-10-CM | POA: Diagnosis not present

## 2016-10-11 LAB — BRAIN NATRIURETIC PEPTIDE: B Natriuretic Peptide: 373.8 pg/mL — ABNORMAL HIGH (ref 0.0–100.0)

## 2016-10-11 LAB — HEPARIN LEVEL (UNFRACTIONATED)
HEPARIN UNFRACTIONATED: 0.35 [IU]/mL (ref 0.30–0.70)
Heparin Unfractionated: 0.4 IU/mL (ref 0.30–0.70)

## 2016-10-11 LAB — ECHOCARDIOGRAM COMPLETE
HEIGHTINCHES: 64 in
Weight: 3251.2 oz

## 2016-10-11 LAB — CBC
HCT: 40.5 % (ref 39.0–52.0)
Hemoglobin: 13.7 g/dL (ref 13.0–17.0)
MCH: 30.8 pg (ref 26.0–34.0)
MCHC: 33.8 g/dL (ref 30.0–36.0)
MCV: 91 fL (ref 78.0–100.0)
PLATELETS: 141 10*3/uL — AB (ref 150–400)
RBC: 4.45 MIL/uL (ref 4.22–5.81)
RDW: 13.2 % (ref 11.5–15.5)
WBC: 7 10*3/uL (ref 4.0–10.5)

## 2016-10-11 LAB — LIPID PANEL
CHOLESTEROL: 163 mg/dL (ref 0–200)
HDL: 49 mg/dL (ref >40)
LDL Cholesterol: 99 mg/dL (ref 0–99)
Total CHOL/HDL Ratio: 3.3 RATIO
Triglycerides: 76 mg/dL (ref <150)
VLDL: 15 mg/dL (ref 0–40)

## 2016-10-11 LAB — TROPONIN I

## 2016-10-11 MED ORDER — REGADENOSON 0.4 MG/5ML IV SOLN
INTRAVENOUS | Status: AC
  Filled 2016-10-11: qty 5

## 2016-10-11 MED ORDER — REGADENOSON 0.4 MG/5ML IV SOLN
0.4000 mg | Freq: Once | INTRAVENOUS | Status: DC
  Filled 2016-10-11: qty 5

## 2016-10-11 MED ORDER — SODIUM CHLORIDE 0.9 % IV SOLN: INTRAVENOUS | Status: DC

## 2016-10-11 MED ORDER — METOPROLOL TARTRATE 25 MG PO TABS
25.0000 mg | ORAL_TABLET | Freq: Two times a day (BID) | ORAL | Status: DC
  Administered 2016-10-11: 25 mg via ORAL
  Filled 2016-10-11: qty 1

## 2016-10-11 MED ORDER — SODIUM CHLORIDE 0.9% FLUSH: 3.0000 mL | Freq: Two times a day (BID) | INTRAVENOUS | Status: DC

## 2016-10-11 MED ORDER — SODIUM CHLORIDE 0.9 % IV SOLN: 250.0000 mL | INTRAVENOUS | Status: DC

## 2016-10-11 MED ORDER — DILTIAZEM HCL 100 MG IV SOLR
5.0000 mg/h | INTRAVENOUS | Status: DC
  Administered 2016-10-11: 5 mg/h via INTRAVENOUS
  Administered 2016-10-11 – 2016-10-12 (×3): 10 mg/h via INTRAVENOUS
  Filled 2016-10-11 (×3): qty 100

## 2016-10-11 MED ORDER — METOPROLOL TARTRATE 25 MG PO TABS
25.0000 mg | ORAL_TABLET | Freq: Three times a day (TID) | ORAL | Status: DC
  Administered 2016-10-11 – 2016-10-13 (×5): 25 mg via ORAL
  Filled 2016-10-11 (×5): qty 1

## 2016-10-11 MED ORDER — SODIUM CHLORIDE 0.9% FLUSH: 3.0000 mL | INTRAVENOUS | Status: DC | PRN

## 2016-10-11 MED ORDER — TECHNETIUM TC 99M TETROFOSMIN IV KIT
10.0000 | PACK | Freq: Once | INTRAVENOUS | Status: AC | PRN
  Administered 2016-10-11: 10 via INTRAVENOUS

## 2016-10-11 NOTE — Addendum Note (Signed)
Addended by: Madalyn RobOX, Mete Purdum A on: 10/11/2016 11:05 AM   Modules accepted: Orders

## 2016-10-11 NOTE — Progress Notes (Addendum)
The patient has been seen in conjunction with Adrian Ramos, PAC. All aspects of care have been considered and discussed. The patient has been personally interviewed, examined, and all clinical data has been reviewed.   Atrial fibrillation of unknown duration. Chest x-ray demonstrates cardiomegaly. Would not be surprised if tachycardia induced LV dysfunction is present.  Despite IV diltiazem, rate is under poor control this morning. I have canceled the stress portion of the patient's nuclear study. Unfortunately, he has already had the rest images. We will have these developed and interpreted, because with his current elevated blood pressures and heart rates greater than 120 bpm, if significant coronary disease is present the scans will be abnormal.  We will continue anticoagulation, slow ventricular response, check LV function on echo which I suspect will be decreased, and consider performing TEE cardioversion prior to discharge.  We will cycle cardiac markers. Also check BNP.    Patient Name: Adrian Ramos Date of Encounter: 10/11/2016  Primary Cardiologist: Dr. Lenise Ramos Problem List     Principal Problem:   Atrial fibrillation with RVR [facility]) Active Problems:   SOB (shortness of breath)   Preoperative clearance    Subjective   Feeling well. No chest pain, sob or palpitations.   Inpatient Medications    Scheduled Meds: . cholecalciferol  1,000 Units Oral Q supper  . finasteride  5 mg Oral QPM  . omega-3 acid ethyl esters  1 g Oral Q supper  . regadenoson  0.4 mg Intravenous Once  . traZODone  200 mg Oral QHS  . vitamin B-12  100 mcg Oral Daily   Continuous Infusions: . diltiazem (CARDIZEM) infusion 5 mg/hr (10/11/16 0945)  . heparin 1,100 Units/hr (10/11/16 0952)   PRN Meds: acetaminophen, ondansetron (ZOFRAN) IV   Vital Signs    Vitals:   10/10/16 1558 10/10/16 2009 10/11/16 0400  BP: (!) 128/91 (!) 125/95 (!) 146/86  Pulse: 96 (!) 114 98  Resp:  18  18  Temp: 98.2 F (36.8 C) 98.7 F (37.1 C) 98.2 F (36.8 C)  TempSrc: Oral Oral Oral  SpO2: 98% 96% 95%  Weight: 203 lb 3.2 oz (92.2 kg)    Height: 5\' 4"  (1.626 m)      Intake/Output Summary (Last 24 hours) at 10/11/16 1014 Last data filed at 10/11/16 0900  Gross per 24 hour  Intake            253.7 ml  Output              400 ml  Net           -146.3 ml   Filed Weights   10/10/16 1558  Weight: 203 lb 3.2 oz (92.2 kg)    Physical Exam   GEN: Well nourished, well developed, in no acute distress.  HEENT: Grossly normal.  Neck: Supple, no JVD, carotid bruits, or masses. Cardiac: Ir Ir, no murmurs, rubs, or gallops. No clubbing, cyanosis, edema.  Radials/DP/PT 2+ and equal bilaterally.  Respiratory:  Respirations regular and unlabored, clear to auscultation bilaterally. GI: Soft, nontender, nondistended, BS + x 4. MS: no deformity or atrophy. Skin: warm and dry, no rash. Neuro:  Strength and sensation are intact. Psych: AAOx3.  Normal affect.  Labs    CBC  Recent Labs  10/10/16 1818 10/11/16 0228  WBC 9.7 7.0  HGB 14.5 13.7  HCT 42.3 40.5  MCV 90.8 91.0  PLT 156 141*   Basic Metabolic Panel  Recent Labs  10/10/16  1818  NA 139  K 4.0  CL 105  CO2 24  GLUCOSE 123*  BUN 5*  CREATININE 0.63  CALCIUM 8.8*   Liver Function Tests  Recent Labs  10/10/16 1818  AST 25  ALT 26  ALKPHOS 52  BILITOT 0.7  PROT 6.3*  ALBUMIN 3.6   No results for input(s): LIPASE, AMYLASE in the last 72 hours. Cardiac Enzymes No results for input(s): CKTOTAL, CKMB, CKMBINDEX, TROPONINI in the last 72 hours. BNP Invalid input(s): POCBNP D-Dimer No results for input(s): DDIMER in the last 72 hours. Hemoglobin A1C No results for input(s): HGBA1C in the last 72 hours. Fasting Lipid Panel  Recent Labs  10/11/16 0228  CHOL 163  HDL 49  LDLCALC 99  TRIG 76  CHOLHDL 3.3   Thyroid Function Tests  Recent Labs  10/10/16 1818  TSH 1.170    Telemetry    afib  at rate of 90-100s.  - Personally Reviewed  ECG 10/11/16    Afib at rate of 101 bpm with RBBB - Personally Reviewed  Radiology    No results found.  Cardiac Studies   Pending echo and stress test  Patient Profile     Adrian ChaletMark I Ramos is a 70 y.o. male with a history of SOB and atrial fibrillation (First noted by PCP 10/03/16)who is referred for cardiac surgical clearance prior to undergoing right inguinal hernia repair. Admitted directly from clinic with afib RVR.   Assessment & Plan    1. Persistent atrial fibrillation - admitted with elevated rate of 130. Rate improved to 90-100s on IV cardizem. However it went to 150s this morning with ambulation.  CHADS2VASC score is 1 but due to persistence of atrial fibrillation plan to start Xarelto 20mg  daily. Currently on IV heparin. TSH normal. LDL 99.  Pending echo and Myoview. Unknown etiology. Continue IV Cardizem for now.   2. SOB - likely due to afib RVR. He does have a remote history of smoking and family history of CAD so need to consider underlying CAD.  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  3. Preoperative cardiac clearance for Inguinal hernia repair - Pending disposition  Signed, Ramos,Bhavinkumar, PA  10/11/2016, 10:14 AM

## 2016-10-11 NOTE — Progress Notes (Signed)
Spoke with Wendy/triage with [facility] with Dr Abbey Chattersosenbower in regards to pt is now inpt. Routed cardiology note to Dr Abbey Chattersosenbower.

## 2016-10-11 NOTE — Progress Notes (Signed)
BP of 150/107 with HR in 110s-120s. Will start him on metoprolol 25mg  BID. TEE/DCCV tomorrow at 10am.

## 2016-10-11 NOTE — Telephone Encounter (Signed)
Prior auth for Xarelto 20mg  obtained from [facility]. [id number], and is good through 12/17/2017.

## 2016-10-11 NOTE — Progress Notes (Signed)
   Discussed TEE cardioversion and risk of stroke, mechanical esophageal or airway injury, skin burn, and cardiac rhythm disturbance.  LVEF is down ~ 35%  Would start Amiodarone to help with maintenance of NSR but allergic to Iodinated contrast agents.  Add low dose beta blocker.

## 2016-10-11 NOTE — Progress Notes (Signed)
  Echocardiogram 2D Echocardiogram has been performed.  Lanyia Jewel 10/11/2016, 1:19 PM

## 2016-10-11 NOTE — Progress Notes (Signed)
PA lets pt know that Dr. Katrinka BlazingSmith wants to cancel stress test today, BP increased and HR AFIB with 130's. Erin, PA increased Cardizem drip too 10mg /hr. Will call report to RN. Pt transferred back to floor

## 2016-10-11 NOTE — Progress Notes (Signed)
ANTICOAGULATION CONSULT NOTE  Pharmacy Consult for Heparin  Indication: atrial fibrillation  Allergies  Allergen Reactions  . Iodinated Diagnostic Agents Hives and Other (See Comments)    Unknown if MRI or CT was used for pelvis scan? different contrasts between them Developed hives 1 hour after scanning needs 13 hour pre med prior to scan   Patient Measurements: Height: 5\' 4"  (162.6 cm) Weight: 203 lb 3.2 oz (92.2 kg) IBW/kg (Calculated) : 59.2  Vital Signs: Temp: 98.2 F (36.8 C) (10/25 0400) Temp Source: Oral (10/25 0400) BP: 146/86 (10/25 0400) Pulse Rate: 98 (10/25 0400)  Labs:  Recent Labs  10/10/16 1818 10/11/16 0228  HGB 14.5 13.7  HCT 42.3 40.5  PLT 156 141*  HEPARINUNFRC  --  0.40  CREATININE 0.63  --     Estimated Creatinine Clearance: 86.7 mL/min (by C-G formula based on SCr of 0.63 mg/dL).  Assessment: [age] YOM admitted for cardiac clearance prior to right inguinal hernia repair (scheduled 10/27). Pt continuing on heparin for afib (no anticoagulation PTA). Chads2vasc only 1 but to transition to Xarelto eventually per Cards due to persistent afib. Hg wnl, plt 141, no bleed documented.  Heparin level therapeutic x2 (0.35) on 1100 units/h.  Goal of Therapy:  Heparin level 0.3-0.7 units/ml Monitor platelets by anticoagulation protocol: Yes   Plan:  - Heparin gtt at 1100 units/hr - Daily heparin level and CBC - Surgery scheduled 10/27 - F/u Cards plans to switch to Xarelto at some point   Babs BertinHaley Ulonda Klosowski, PharmD, BCPS Clinical Pharmacist 10/11/2016 10:10 AM

## 2016-10-11 NOTE — Progress Notes (Signed)
ANTICOAGULATION CONSULT NOTE - Follow Up Consult  Pharmacy Consult for Heparin  Indication: atrial fibrillation  Allergies  Allergen Reactions  . Iodinated Diagnostic Agents Hives and Other (See Comments)    Unknown if MRI or CT was used for pelvis scan? different contrasts between them Developed hives 1 hour after scanning needs 13 hour pre med prior to scan   Patient Measurements: Height: 5\' 4"  (162.6 cm) Weight: 203 lb 3.2 oz (92.2 kg) IBW/kg (Calculated) : 59.2  Vital Signs: Temp: 98.7 F (37.1 C) (10/24 2009) Temp Source: Oral (10/24 2009) BP: 125/95 (10/24 2009) Pulse Rate: 114 (10/24 2009)  Labs:  Recent Labs  10/10/16 1818 10/11/16 0228  HGB 14.5 13.7  HCT 42.3 40.5  PLT 156 141*  HEPARINUNFRC  --  0.40  CREATININE 0.63  --     Estimated Creatinine Clearance: 86.7 mL/min (by C-G formula based on SCr of 0.63 mg/dL).  Assessment: Heparin for afib, initial heparin level is good, plans to transition to Xarelto  Goal of Therapy:  Heparin level 0.3-0.7 units/ml Monitor platelets by anticoagulation protocol: Yes   Plan:  -Cont heparin 1100 units/hr -1200 HL  Kejon Feild 10/11/2016,3:52 AM

## 2016-10-12 ENCOUNTER — Encounter (HOSPITAL_COMMUNITY): Payer: Self-pay | Admitting: Certified Registered Nurse Anesthetist

## 2016-10-12 ENCOUNTER — Inpatient Hospital Stay (HOSPITAL_COMMUNITY): Payer: Medicare Other | Admitting: Certified Registered Nurse Anesthetist

## 2016-10-12 ENCOUNTER — Inpatient Hospital Stay (HOSPITAL_COMMUNITY): Payer: Medicare Other

## 2016-10-12 ENCOUNTER — Encounter (HOSPITAL_COMMUNITY)
Admission: AD | Disposition: A | Discharge status: Home / Self Care | Payer: Self-pay | Source: Ambulatory Visit | Attending: Cardiology

## 2016-10-12 DIAGNOSIS — I4891 Unspecified atrial fibrillation: Secondary | ICD-10-CM

## 2016-10-12 DIAGNOSIS — I5041 Acute combined systolic (congestive) and diastolic (congestive) heart failure: Secondary | ICD-10-CM

## 2016-10-12 HISTORY — PX: TEE WITHOUT CARDIOVERSION: SHX5443

## 2016-10-12 HISTORY — PX: CARDIOVERSION: SHX1299

## 2016-10-12 LAB — CBC
HCT: 39.7 % (ref 39.0–52.0)
HEMOGLOBIN: 13.4 g/dL (ref 13.0–17.0)
MCH: 30.9 pg (ref 26.0–34.0)
MCHC: 33.8 g/dL (ref 30.0–36.0)
MCV: 91.7 fL (ref 78.0–100.0)
PLATELETS: 147 10*3/uL — AB (ref 150–400)
RBC: 4.33 MIL/uL (ref 4.22–5.81)
RDW: 13.2 % (ref 11.5–15.5)
WBC: 7.1 10*3/uL (ref 4.0–10.5)

## 2016-10-12 LAB — HEPARIN LEVEL (UNFRACTIONATED): Heparin Unfractionated: 0.36 IU/mL (ref 0.30–0.70)

## 2016-10-12 LAB — TROPONIN I

## 2016-10-12 SURGERY — CARDIOVERSION
Anesthesia: General

## 2016-10-12 MED ORDER — PROPOFOL 500 MG/50ML IV EMUL
INTRAVENOUS | Status: DC | PRN
  Administered 2016-10-12: 75 ug/kg/min via INTRAVENOUS

## 2016-10-12 MED ORDER — RIVAROXABAN 20 MG PO TABS
20.0000 mg | ORAL_TABLET | Freq: Every day | ORAL | Status: DC
  Administered 2016-10-13 – 2016-10-16 (×4): 20 mg via ORAL
  Filled 2016-10-12 (×4): qty 1

## 2016-10-12 MED ORDER — SODIUM CHLORIDE 0.9 % IV SOLN
INTRAVENOUS | Status: DC | PRN
  Administered 2016-10-12: 10:00:00 via INTRAVENOUS

## 2016-10-12 MED ORDER — RIVAROXABAN 20 MG PO TABS
20.0000 mg | ORAL_TABLET | Freq: Every day | ORAL | Status: DC
  Administered 2016-10-12: 20 mg via ORAL
  Filled 2016-10-12: qty 1

## 2016-10-12 MED ORDER — AMIODARONE HCL 200 MG PO TABS
200.0000 mg | ORAL_TABLET | Freq: Two times a day (BID) | ORAL | Status: DC
  Administered 2016-10-12 – 2016-10-14 (×5): 200 mg via ORAL
  Filled 2016-10-12 (×5): qty 1

## 2016-10-12 MED ORDER — ATORVASTATIN CALCIUM 40 MG PO TABS
40.0000 mg | ORAL_TABLET | Freq: Every day | ORAL | Status: DC
  Administered 2016-10-12 – 2016-10-16 (×5): 40 mg via ORAL
  Filled 2016-10-12 (×5): qty 1

## 2016-10-12 NOTE — CV Procedure (Signed)
    PROCEDURE NOTE:  Procedure:  Transesophageal echocardiogram Operator:  Armanda Magicraci Turner, MD Indications:  Atrial fibrillation with RVR Complications: None  During this procedure the patient is administered a total of 180 of propofol to achieve and maintain moderate  sedation.  The patient's heart rate, blood pressure, and oxygen saturation are monitored continuously during the procedure by anesthesia.   Results: Normal  LV size and mild to moderate reduced LV function with EF 40% Normal RV size and function Mildly dilated  RA Mildly dilated  LA with normal LA appendage and no evidence of thrombus.  The emptying velocity is mildly reduced at 30 Normal TV with trivial TR Normal PV with trivial PR Normal MV with trivial MR Normal trileaflet AV with trivial AR Normal interatrial septum with no evidence of shunt by colorflow dopper  Mild atherosclerotic plaque in the thoracic and ascending aorta.  The patient tolerated the procedure well and went on to DCCV   Electrical Cardioversion Procedure Note Drucilla ChaletMark I Mcclellan [id number] 12/28/1945  Procedure: Electrical Cardioversion Indications:  Atrial Fibrillation  Time Out: Verified patient identification, verified procedure,medications/allergies/relevent history reviewed, required imaging and test results available.  Performed  Procedure Details  The patient was NPO after midnight. Anesthesia was administered at the beside  by Dr.Fitzgerald. Cardioversion was done with synchronized biphasic defibrillation with AP pads with 150watts.  The patient converted to normal sinus rhythm but quickly went back into atrial fibrillation.  Cardioversion was done with synchronized biphasic defibrillation with AP pads with 200watts which converted the patient to NSR but immediately went back to atrial fibrillation.  The patient tolerated the procedure well   IMPRESSION:  Unuccessful cardioversion of atrial fibrillation    Adrian Ramos 10/12/2016, 9:21  AM

## 2016-10-12 NOTE — Anesthesia Postprocedure Evaluation (Signed)
Anesthesia Post Note  Patient: Adrian Ramos  Procedure(s) Performed: Procedure(s) (LRB): CARDIOVERSION (N/A) TRANSESOPHAGEAL ECHOCARDIOGRAM (TEE) (N/A)  Patient location during evaluation: PACU Anesthesia Type: MAC Level of consciousness: awake and alert Pain management: pain level controlled Vital Signs Assessment: post-procedure vital signs reviewed and stable Respiratory status: spontaneous breathing, nonlabored ventilation, respiratory function stable and patient connected to nasal cannula oxygen Cardiovascular status: stable and blood pressure returned to baseline Anesthetic complications: no    Last Vitals:  Vitals:   10/12/16 1055 10/12/16 1100  BP: (!) 96/58 (!) 101/59  Pulse: 76 71  Resp: (!) 27 18  Temp:      Last Pain:  Vitals:   10/12/16 1045  TempSrc: Oral  PainSc:                  [name],W. EDMOND

## 2016-10-12 NOTE — Progress Notes (Signed)
   Failed cardioversion.  Plan now is amiodarone loading and repeat cardioversion in 4 weeks. Discussed concept of Iodine/contrast allergy and amiodarone use, who felt is safe / not contraindicated.  Convert to Xarelto.

## 2016-10-12 NOTE — Progress Notes (Signed)
The patient has been seen in conjunction with Vin Bhagat, PAC. All aspects of care have been considered and discussed. The patient has been personally interviewed, examined, and all clinical data has been reviewed.   Complicated clinical situation. Likely tachycardia induced systolic failure. Concerned about using amiodarone to maintain normal sinus rhythm because of iodine allergy.  Resting myocardial perfusion study yesterday did demonstrate an apical perfusion abnormality.  Plan to proceed with TEE cardioversion today. If atrial fibrillation recurs rapidly, we'll consult EP for further management considerations including AAD.  Risk factors for recurrent atrial fibrillation are obesity, heavy alcohol use, and CHF.  CHADS VASC is 413 (age, CHF, vascular disease).  Patient Name: Adrian Ramos Date of Encounter: 10/12/2016  Primary Cardiologist: Dr. Mayford Knifeurner (seen in clinic and sent to hospital) The patient is followed by Dr. Katrinka BlazingSmith for the past 3 days.  He wishes to f/u with Dr. Katrinka BlazingSmith as outpatient.   Hospital Problem List     Principal Problem:   Atrial fibrillation with RVR (HCC) Active Problems:   SOB (shortness of breath)   Preoperative clearance    Subjective   Feeling well. No chest pain, sob or palpitations. He is nervous about procedure.   Inpatient Medications    Scheduled Meds: . cholecalciferol  1,000 Units Oral Q supper  . finasteride  5 mg Oral QPM  . metoprolol tartrate  25 mg Oral Q8H  . omega-3 acid ethyl esters  1 g Oral Q supper  . regadenoson  0.4 mg Intravenous Once  . regadenoson  0.4 mg Intravenous Once  . sodium chloride flush  3 mL Intravenous Q12H  . traZODone  200 mg Oral QHS  . vitamin B-12  100 mcg Oral Daily   Continuous Infusions: . sodium chloride    . sodium chloride    . diltiazem (CARDIZEM) infusion 10 mg/hr (10/12/16 0629)  . heparin 1,100 Units/hr (10/11/16 0952)   PRN Meds: acetaminophen, ondansetron (ZOFRAN) IV, sodium  chloride flush   Vital Signs    Vitals:   10/11/16 1335 10/11/16 2024 10/11/16 2249 10/12/16 0540  BP: (!) 150/107 121/89 (!) 148/82 122/80  Pulse: (!) 128 77  74  Resp: 18 18  18   Temp: 98.1 F (36.7 C) 97.7 F (36.5 C)  97.4 F (36.3 C)  TempSrc: Oral Oral  Oral  SpO2: 94% 93%  91%  Weight:      Height:        Intake/Output Summary (Last 24 hours) at 10/12/16 0910 Last data filed at 10/12/16 0837  Gross per 24 hour  Intake              360 ml  Output             2500 ml  Net            -2140 ml   Filed Weights   10/10/16 1558  Weight: 203 lb 3.2 oz (92.2 kg)    Physical Exam   GEN: Well nourished, well developed, in no acute distress.  HEENT: Grossly normal.  Neck: Supple, no JVD, carotid bruits, or masses. Cardiac: Ir Ir, no murmurs, rubs, or gallops. No clubbing, cyanosis, edema.  Radials/DP/PT 2+ and equal bilaterally.  Respiratory:  Respirations regular and unlabored, clear to auscultation bilaterally. GI: Soft, nontender, nondistended, BS + x 4. MS: no deformity or atrophy. Skin: warm and dry, no rash. Bruise at IV site.  Neuro:  Strength and sensation are intact. Psych: AAOx3.  Normal affect.  Labs    CBC  Recent Labs  10/11/16 0228 10/12/16 0209  WBC 7.0 7.1  HGB 13.7 13.4  HCT 40.5 39.7  MCV 91.0 91.7  PLT 141* 147*   Basic Metabolic Panel  Recent Labs  10/10/16 1818  NA 139  K 4.0  CL 105  CO2 24  GLUCOSE 123*  BUN 5*  CREATININE 0.63  CALCIUM 8.8*   Liver Function Tests  Recent Labs  10/10/16 1818  AST 25  ALT 26  ALKPHOS 52  BILITOT 0.7  PROT 6.3*  ALBUMIN 3.6   No results for input(s): LIPASE, AMYLASE in the last 72 hours. Cardiac Enzymes  Recent Labs  10/11/16 1504 10/12/16 0209  TROPONINI <0.03 <0.03   BNP Invalid input(s): POCBNP D-Dimer No results for input(s): DDIMER in the last 72 hours. Hemoglobin A1C No results for input(s): HGBA1C in the last 72 hours. Fasting Lipid Panel  Recent Labs   10/11/16 0228  CHOL 163  HDL 49  LDLCALC 99  TRIG 76  CHOLHDL 3.3   Thyroid Function Tests  Recent Labs  10/10/16 1818  TSH 1.170    Telemetry    afib at rate of 80-100s with intermittent pause, longest 2.4sec - Personally Reviewed  ECG 10/11/16    Afib at rate of 101 bpm with RBBB - Personally Reviewed  Radiology    Nm Myocar Multi W/spect W/wall Motion / Ef  Result Date: 10/11/2016 CLINICAL DATA:  Atrial fibrillation and dyspnea on exertion. Shortness of breath. Stress acquisition could not be performed due to rapid atrial fibrillation. EXAM: MYOCARDIAL IMAGING WITH SPECT (REST) TECHNIQUE: Standard myocardial SPECT imaging was performed after resting intravenous injection of 10 Tc-30m tetrofosmin. Quantitative gated imaging was also performed to evaluate left ventricular wall motion, and estimate left ventricular ejection fraction. COMPARISON:  None. FINDINGS: Resting SPECT acquisition demonstrates an apical perfusion defect extending into the distal septum and distal inferior wall. IMPRESSION: Rest only acquisition demonstrates apical perfusion defect extending into inferior septal walls. Electronically Signed   By: Irish Lack M.D.   On: 10/11/2016 15:32    Cardiac Studies  Transthoracic echocardiogram 10/11/16: Study Conclusions  - Left ventricle: The cavity size was normal. There was mild focal   basal hypertrophy of the septum. Systolic function was moderately   reduced. The estimated ejection fraction was in the range of 35%   to 40%. Diffuse hypokinesis. - Aortic valve: Trileaflet; mildly thickened, mildly calcified   leaflets. - Left atrium: The atrium was moderately dilated.  Patient Profile     Adrian Ramos is a 70 y.o. male with a history of SOB and atrial fibrillation (First noted by PCP 10/03/16)who is referred for cardiac surgical clearance prior to undergoing right inguinal hernia repair. Admitted directly from clinic with afib RVR.    Assessment & Plan    1. Persistent atrial fibrillation - Admitted with elevated rate of 130s. Rate improved to 90-100s on IV cardizem. However it went to 130- 150s again AM of 10/12/15. Added BB to regimen with improvement of rate. For TEE/DCCV todat.  - CHADS2VASC score is 3 but due to persistence of atrial fibrillation plan to start Xarelto 20mg  daily. Currently on IV heparin. TSH normal. LDL 99.  - Echo showed LV Ef of 35-40% with diffuse hypokinesis, LA moderately dilated. Resting imagine of stress test showed apical perfusion defect extending into inferior septal walls (unable to start stress part due to afib RVR).  - Will start him on lipitor 40mg . Will need repeat  lipid panel and LFT in 6 weeks.   2. Acute combined systolic and diastolic heart failure - In setting of afib RVR. He does have a remote history of smoking and family history of CAD so need to consider underlying CAD. Resting stress test as above.  Will need further evaluation after restoration of sinus rhythm. Decreased LV function is likely related to tachycardia.  3. Preoperative cardiac clearance for Inguinal hernia repair - Pending disposition  Signed, Bhagat,Bhavinkumar, PA  10/12/2016, 9:10 AM

## 2016-10-12 NOTE — Anesthesia Preprocedure Evaluation (Addendum)
Anesthesia Evaluation  Patient identified by MRN, date of birth, ID band Patient awake    Reviewed: Allergy & Precautions, H&P , NPO status , Patient's Chart, lab work & pertinent test results  Airway Mallampati: III  TM Distance: >3 FB Neck ROM: Full    Dental no notable dental hx. (+) Teeth Intact, Poor Dentition, Dental Advisory Given   Pulmonary neg pulmonary ROS, shortness of breath and with exertion, former smoker,    Pulmonary exam normal breath sounds clear to auscultation       Cardiovascular hypertension, Pt. on medications + dysrhythmias Atrial Fibrillation  Rhythm:Irregular Rate:Normal     Neuro/Psych Anxiety negative neurological ROS     GI/Hepatic negative GI ROS, Neg liver ROS,   Endo/Other  negative endocrine ROS  Renal/GU negative Renal ROS  negative genitourinary   Musculoskeletal   Abdominal   Peds  Hematology negative hematology ROS (+)   Anesthesia Other Findings   Reproductive/Obstetrics negative OB ROS                           Anesthesia Physical Anesthesia Plan  ASA: III  Anesthesia Plan: MAC   Post-op Pain Management:    Induction: Intravenous  Airway Management Planned: Nasal Cannula  Additional Equipment:   Intra-op Plan:   Post-operative Plan:   Informed Consent: I have reviewed the patients History and Physical, chart, labs and discussed the procedure including the risks, benefits and alternatives for the proposed anesthesia with the patient or authorized representative who has indicated his/her understanding and acceptance.   Dental advisory given  Plan Discussed with: CRNA, Anesthesiologist and Surgeon  Anesthesia Plan Comments:       Anesthesia Quick Evaluation

## 2016-10-12 NOTE — Transfer of Care (Signed)
Immediate Anesthesia Transfer of Care Note  Patient: Adrian Ramos  Procedure(s) Performed: Procedure(s): CARDIOVERSION (N/A) TRANSESOPHAGEAL ECHOCARDIOGRAM (TEE) (N/A)  Patient Location: Endoscopy Unit  Anesthesia Type:MAC  Level of Consciousness: awake, alert , oriented and patient cooperative  Airway & Oxygen Therapy: Patient Spontanous Breathing and Patient connected to nasal cannula oxygen  Post-op Assessment: Report given to RN, Post -op Vital signs reviewed and stable and Patient moving all extremities X 4  Post vital signs: Reviewed and stable  Last Vitals:  Vitals:   10/12/16 0924 10/12/16 1045  BP: 131/85 107/86  Pulse: 76 64  Resp: 19 17  Temp: 36.4 C 36.4 C    Last Pain:  Vitals:   10/12/16 1045  TempSrc: Oral  PainSc:          Complications: No apparent anesthesia complications

## 2016-10-12 NOTE — Progress Notes (Signed)
Contacted IV team due to pt having redness and pain at IV site. Cardizem stopped, IV team assessed and new IV started. Paged Dr. Rennis GoldenHilty about similar occurrence that happened on 10/24. Advised to continue with Cardizem as ordered until cardioversion. Add NS to make vein less irritated. Made Day shift RN aware to assess site during her shift. Gregor HamsAlisha Logen Heintzelman, RN

## 2016-10-13 DIAGNOSIS — F1010 Alcohol abuse, uncomplicated: Secondary | ICD-10-CM

## 2016-10-13 MED ORDER — METOPROLOL TARTRATE 50 MG PO TABS
50.0000 mg | ORAL_TABLET | Freq: Two times a day (BID) | ORAL | Status: DC
  Administered 2016-10-13 – 2016-10-14 (×3): 50 mg via ORAL
  Filled 2016-10-13 (×3): qty 1

## 2016-10-13 NOTE — Progress Notes (Signed)
Insurance check completed for Xarelto and Amiodorone Pt copay will be $45, Amiodorone copay will be $2- prior auth not required

## 2016-10-13 NOTE — Care Management Note (Signed)
Case Management Note Donn PieriniKristi Tiyonna Sardinha RN, BSN Unit 2W-Case Manager [phone]  Patient Details  Name: Adrian ChaletMark I Biello MRN: [id number] Date of Birth: 09/27/1946  Subjective/Objective:  Pt admitted with afib                  Action/Plan: PTA pt lived at home- started on Xarelto and amio.- insurance check completed- Pt copay will be $45, Amiodorone copay will be $2- prior auth not required-- spoke with pt at bedside- pt given 30 day free card for Xarelto to use on discharge- pt given info on copay cost for both drugs.   Expected Discharge Date:   10/14/16               Expected Discharge Plan:  Home/Self Care  In-House Referral:     Discharge planning Services  CM Consult, Medication Assistance  Post Acute Care Choice:    Choice offered to:     DME Arranged:    DME Agency:     HH Arranged:    HH Agency:     Status of Service:  Completed, signed off  If discussed at [employer] Length of Stay Meetings, dates discussed:    Additional Comments:  Darrold SpanWebster, Elzie Sheets Hall, RN 10/13/2016, 4:55 PM

## 2016-10-13 NOTE — Progress Notes (Addendum)
Patient Name: Adrian Ramos Date of Encounter: 10/13/2016  Primary Cardiologist: Armanda Magicraci Turner, M.D.  Hospital Problem List     Principal Problem:   Atrial fibrillation with RVR (HCC) Active Problems:   SOB (shortness of breath)   Preoperative clearance   Acute combined systolic and diastolic heart failure (HCC)     Subjective   He is very anxious.  Asymptomatic otherwise.  Inpatient Medications    Scheduled Meds: . amiodarone  200 mg Oral BID  . atorvastatin  40 mg Oral q1800  . cholecalciferol  1,000 Units Oral Q supper  . finasteride  5 mg Oral QPM  . metoprolol tartrate  50 mg Oral BID  . omega-3 acid ethyl esters  1 g Oral Q supper  . regadenoson  0.4 mg Intravenous Once  . regadenoson  0.4 mg Intravenous Once  . rivaroxaban  20 mg Oral Q supper  . traZODone  200 mg Oral QHS  . vitamin B-12  100 mcg Oral Daily   Continuous Infusions: . diltiazem (CARDIZEM) infusion 10 mg/hr (10/12/16 0629)   PRN Meds: acetaminophen, ondansetron (ZOFRAN) IV   Vital Signs    Vitals:   10/12/16 1115 10/12/16 1357 10/12/16 2048 10/13/16 0430  BP: 105/77 137/72 121/89 129/87  Pulse: 74 94 (!) 104 (!) 124  Resp: 20 18 20 18   Temp: 97.6 F (36.4 C) 98.6 F (37 C) 97.7 F (36.5 C) 98.6 F (37 C)  TempSrc: Oral Oral Oral Oral  SpO2: 98% 95% 97% 94%  Weight:      Height:        Intake/Output Summary (Last 24 hours) at 10/13/16 1111 Last data filed at 10/13/16 1016  Gross per 24 hour  Intake              480 ml  Output              400 ml  Net               80 ml   Filed Weights   10/10/16 1558  Weight: 203 lb 3.2 oz (92.2 kg)    Physical Exam    GEN: Morbidly obese, well developed, in no acute distress.  HEENT: Grossly normal.  Neck: Supple, no JVD, carotid bruits, or masses. Cardiac: rapid IIRR, no murmurs, rubs, or gallops. No clubbing, cyanosis, edema.  Radials/DP/PT 2+ and equal bilaterally.  Respiratory:  Respirations regular and unlabored, clear to  auscultation bilaterally. GI: Soft, nontender, nondistended, BS + x 4. MS: no deformity or atrophy. Skin: warm and dry, no rash. Neuro:  Strength and sensation are intact. Psych: AAOx3.  Normal affect.  Labs    CBC  Recent Labs  10/11/16 0228 10/12/16 0209  WBC 7.0 7.1  HGB 13.7 13.4  HCT 40.5 39.7  MCV 91.0 91.7  PLT 141* 147*   Basic Metabolic Panel  Recent Labs  10/10/16 1818  NA 139  K 4.0  CL 105  CO2 24  GLUCOSE 123*  BUN 5*  CREATININE 0.63  CALCIUM 8.8*   Liver Function Tests  Recent Labs  10/10/16 1818  AST 25  ALT 26  ALKPHOS 52  BILITOT 0.7  PROT 6.3*  ALBUMIN 3.6   No results for input(s): LIPASE, AMYLASE in the last 72 hours. Cardiac Enzymes  Recent Labs  10/11/16 1504 10/12/16 0209  TROPONINI <0.03 <0.03   BNP Invalid input(s): POCBNP D-Dimer No results for input(s): DDIMER in the last 72 hours. Hemoglobin A1C No results for  input(s): HGBA1C in the last 72 hours. Fasting Lipid Panel  Recent Labs  10/11/16 0228  CHOL 163  HDL 49  LDLCALC 99  TRIG 76  CHOLHDL 3.3   Thyroid Function Tests  Recent Labs  10/10/16 1818  TSH 1.170    Telemetry    Atrial fib with rapid ventricular rate up to 150 bpm. - Personally Reviewed  ECG    No new data - Personally Reviewed  Radiology    Nm Myocar Multi W/spect W/wall Motion / Ef  Result Date: 10/11/2016 CLINICAL DATA:  Atrial fibrillation and dyspnea on exertion. Shortness of breath. Stress acquisition could not be performed due to rapid atrial fibrillation. EXAM: MYOCARDIAL IMAGING WITH SPECT (REST) TECHNIQUE: Standard myocardial SPECT imaging was performed after resting intravenous injection of 10 Tc-32m tetrofosmin. Quantitative gated imaging was also performed to evaluate left ventricular wall motion, and estimate left ventricular ejection fraction. COMPARISON:  None. FINDINGS: Resting SPECT acquisition demonstrates an apical perfusion defect extending into the distal  septum and distal inferior wall. IMPRESSION: Rest only acquisition demonstrates apical perfusion defect extending into inferior septal walls. Electronically Signed   By: Irish Lack M.D.   On: 10/11/2016 15:32    Cardiac Studies   No new data  Patient Profile     Adrian Ramos a 70 y.o.malewith a history of SOB and atrial fibrillation (First noted by PCP 10/03/16)who referred for cardiac surgical clearance prior to undergoing right inguinal hernia repair. Admitted directly from clinic with afib RVR. Duration of atrial fibrillation is unknown. He has a history of heavy alcohol intake (1.5 L daily for greater than 8 years). TEE cardioversion performed 10/12/16 , unsuccessful (brief sinus rhythm 2 with reversion to A. fib). Strategy is still for rhythm control given decreased LV function. Will load with amiodarone as an outpatient and repeat cardioversion in one month.  Assessment & Plan    1. Persistent atrial fibrillation. Failed TEE cardioversion 10/12/16. Now loading with oral amiodarone and attempting rate control with beta blocker therapy. Plan is for repeat cardioversion in one month as an outpatient. Once heart rate a little better controlled over the next 24 hours, plan is for discharge. Increasing metoprolol to 50 mg twice daily. Increasing amiodarone to 400 mg twice daily. May need to add low-dose diltiazem if blood pressure tolerates. 2. Because of an elevated Chads Vasc and plan for cardioversion, Xarelto his been started. 3. 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  Signed, Lesleigh Noe, MD  10/13/2016, 11:11 AM

## 2016-10-14 DIAGNOSIS — Z01818 Encounter for other preprocedural examination: Secondary | ICD-10-CM

## 2016-10-14 DIAGNOSIS — I420 Dilated cardiomyopathy: Secondary | ICD-10-CM

## 2016-10-14 MED ORDER — METOPROLOL TARTRATE 50 MG PO TABS
75.0000 mg | ORAL_TABLET | Freq: Two times a day (BID) | ORAL | Status: DC
  Administered 2016-10-14: 75 mg via ORAL
  Filled 2016-10-14: qty 1

## 2016-10-14 MED ORDER — AMIODARONE HCL 200 MG PO TABS
400.0000 mg | ORAL_TABLET | Freq: Two times a day (BID) | ORAL | Status: DC
  Administered 2016-10-14: 400 mg via ORAL
  Filled 2016-10-14 (×2): qty 2

## 2016-10-14 MED ORDER — DIAZEPAM 5 MG PO TABS: 5.0000 mg | ORAL_TABLET | Freq: Three times a day (TID) | ORAL | Status: DC | PRN

## 2016-10-14 NOTE — Progress Notes (Addendum)
Patient Name: Adrian Ramos I Camero Date of Encounter: 10/14/2016  Primary Cardiologist: Armanda Magicraci Caydyn Sprung, M.D.  Hospital Problem List     Principal Problem:   Atrial fibrillation with RVR (HCC) Active Problems:   Preoperative clearance   Acute combined systolic and diastolic heart failure (HCC)   Alcohol abuse     Subjective   No complaints this am except for anxious to go home  Inpatient Medications    Scheduled Meds: . amiodarone  200 mg Oral BID  . atorvastatin  40 mg Oral q1800  . cholecalciferol  1,000 Units Oral Q supper  . finasteride  5 mg Oral QPM  . metoprolol tartrate  50 mg Oral BID  . omega-3 acid ethyl esters  1 g Oral Q supper  . regadenoson  0.4 mg Intravenous Once  . regadenoson  0.4 mg Intravenous Once  . rivaroxaban  20 mg Oral Q supper  . traZODone  200 mg Oral QHS  . vitamin B-12  100 mcg Oral Daily   PRN Meds: acetaminophen, ondansetron (ZOFRAN) IV   Vital Signs    Vitals:   10/13/16 0430 10/13/16 1122 10/13/16 2031 10/14/16 0504  BP: 129/87 (!) 125/94 140/89 (!) 132/101  Pulse: (!) 124 (!) 124 (!) 124 (!) 104  Resp: 18  18 18   Temp: 98.6 F (37 C)  98.2 F (36.8 C) 97.8 F (36.6 C)  TempSrc: Oral  Oral Oral  SpO2: 94%  97% 95%  Weight:      Height:        Intake/Output Summary (Last 24 hours) at 10/14/16 0959 Last data filed at 10/14/16 0846  Gross per 24 hour  Intake              350 ml  Output              545 ml  Net             -195 ml   Filed Weights   10/10/16 1558  Weight: 203 lb 3.2 oz (92.2 kg)    Physical Exam    GEN: Morbidly obese, well developed, in no acute distress.  HEENT: Grossly normal.  Neck: Supple, no JVD, carotid bruits, or masses. Cardiac: rapid IIRR, no murmurs, rubs, or gallops. No clubbing, cyanosis, edema.  Radials/DP/PT 2+ and equal bilaterally.  Respiratory:  Respirations regular and unlabored, clear to auscultation bilaterally. GI: Soft, nontender, nondistended, BS + x 4. MS: no deformity or  atrophy. Skin: warm and dry, no rash. Neuro:  Strength and sensation are intact. Psych: AAOx3.  Normal affect.  Labs    CBC  Recent Labs  10/12/16 0209  WBC 7.1  HGB 13.4  HCT 39.7  MCV 91.7  PLT 147*   Basic Metabolic Panel No results for input(s): NA, K, CL, CO2, GLUCOSE, BUN, CREATININE, CALCIUM, MG, PHOS in the last 72 hours. Liver Function Tests No results for input(s): AST, ALT, ALKPHOS, BILITOT, PROT, ALBUMIN in the last 72 hours. No results for input(s): LIPASE, AMYLASE in the last 72 hours. Cardiac Enzymes  Recent Labs  10/11/16 1504 10/12/16 0209  TROPONINI <0.03 <0.03   BNP Invalid input(s): POCBNP D-Dimer No results for input(s): DDIMER in the last 72 hours. Hemoglobin A1C No results for input(s): HGBA1C in the last 72 hours. Fasting Lipid Panel No results for input(s): CHOL, HDL, LDLCALC, TRIG, CHOLHDL, LDLDIRECT in the last 72 hours. Thyroid Function Tests No results for input(s): TSH, T4TOTAL, T3FREE, THYROIDAB in the last 72 hours.  Invalid  input(s): FREET3  Telemetry    Atrial fib with rapid ventricular rate up to 150 bpm. - Personally Reviewed  ECG    No new data - Personally Reviewed  Radiology    No results found.  Cardiac Studies   No new data  Patient Profile     Amanda CockayneMark I Salomonis a 70 y.o.malewith a history of SOB and atrial fibrillation (First noted by PCP 10/03/16)who referred for cardiac surgical clearance prior to undergoing right inguinal hernia repair. Admitted directly from clinic with afib RVR. Duration of atrial fibrillation is unknown. He has a history of heavy alcohol intake (1.5 L daily for greater than 8 years). TEE cardioversion performed 10/12/16 , unsuccessful (brief sinus rhythm 2 with reversion to A. fib). Strategy is still for rhythm control given decreased LV function. Will load with amiodarone as an outpatient and repeat cardioversion in one month.  Assessment & Plan    1. Persistent atrial fibrillation.  Failed TEE cardioversion 10/12/16. Now loading with oral amiodarone and attempting rate control with beta blocker therapy. Plan is for repeat cardioversion in one month as an outpatient. Heart rate still not adequately controlled and BP elevated as well.  Will increase metoprolol to 75 mg twice daily.  Increase amiodarone to 400 mg twice daily. May need to add low-dose diltiazem but would like to avoid if possible given reduced LVF.  Continue Xarelto.    2. 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  3.  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  Will make NPO after MN for the stress images - he only got resting images because HR was elevated. This showed an apical perfusion abnormality.    4.  HTN - BP poorly controlled.  Increase BB.    Signed, Armanda Magicraci Akeela Busk, MD  10/14/2016, 9:59 AM

## 2016-10-15 ENCOUNTER — Inpatient Hospital Stay (HOSPITAL_COMMUNITY): Payer: Medicare Other

## 2016-10-15 DIAGNOSIS — I420 Dilated cardiomyopathy: Secondary | ICD-10-CM

## 2016-10-15 LAB — NM MYOCAR MULTI W/SPECT W/WALL MOTION / EF
CHL CUP MPHR: 149 {beats}/min
CHL CUP RESTING HR STRESS: 68 {beats}/min
CSEPED: 0 min
CSEPHR: 59 %
CSEPPHR: 88 {beats}/min
Estimated workload: 1 METS
Exercise duration (sec): 0 s

## 2016-10-15 MED ORDER — AMIODARONE HCL 200 MG PO TABS: 200.0000 mg | ORAL_TABLET | Freq: Every day | ORAL | Status: DC

## 2016-10-15 MED ORDER — DIPHENHYDRAMINE HCL 25 MG PO CAPS
50.0000 mg | ORAL_CAPSULE | Freq: Once | ORAL | Status: AC
  Administered 2016-10-16: 50 mg via ORAL
  Filled 2016-10-15: qty 2

## 2016-10-15 MED ORDER — DIPHENHYDRAMINE HCL 25 MG PO CAPS: 50.0000 mg | ORAL_CAPSULE | Freq: Once | ORAL | Status: DC

## 2016-10-15 MED ORDER — LOSARTAN POTASSIUM 25 MG PO TABS
25.0000 mg | ORAL_TABLET | Freq: Every day | ORAL | Status: DC
  Administered 2016-10-15 – 2016-10-16 (×2): 25 mg via ORAL
  Filled 2016-10-15 (×2): qty 1

## 2016-10-15 MED ORDER — REGADENOSON 0.4 MG/5ML IV SOLN
INTRAVENOUS | Status: AC
  Filled 2016-10-15: qty 5

## 2016-10-15 MED ORDER — TECHNETIUM TC 99M TETROFOSMIN IV KIT
30.0000 | PACK | Freq: Once | INTRAVENOUS | Status: AC | PRN
  Administered 2016-10-15: 30 via INTRAVENOUS

## 2016-10-15 MED ORDER — AMLODIPINE BESYLATE 5 MG PO TABS: 5.0000 mg | ORAL_TABLET | Freq: Every day | ORAL | Status: DC

## 2016-10-15 MED ORDER — CARVEDILOL 6.25 MG PO TABS: 6.2500 mg | ORAL_TABLET | Freq: Two times a day (BID) | ORAL | Status: DC

## 2016-10-15 MED ORDER — AMIODARONE HCL 200 MG PO TABS
200.0000 mg | ORAL_TABLET | Freq: Two times a day (BID) | ORAL | Status: DC
  Administered 2016-10-15 – 2016-10-16 (×3): 200 mg via ORAL
  Filled 2016-10-15 (×3): qty 1

## 2016-10-15 MED ORDER — CARVEDILOL 6.25 MG PO TABS
6.2500 mg | ORAL_TABLET | Freq: Two times a day (BID) | ORAL | Status: DC
Start: 2016-10-15 — End: 2016-10-16
  Administered 2016-10-15 – 2016-10-16 (×3): 6.25 mg via ORAL
  Filled 2016-10-15 (×3): qty 1

## 2016-10-15 MED ORDER — PREDNISONE 20 MG PO TABS
50.0000 mg | ORAL_TABLET | Freq: Four times a day (QID) | ORAL | Status: AC
  Administered 2016-10-15 – 2016-10-16 (×3): 50 mg via ORAL
  Filled 2016-10-15 (×3): qty 2

## 2016-10-15 NOTE — Progress Notes (Signed)
I personally reviewed nuclear images.  No ischemia noted.  There is a fixed inferior defect that is likely diaphragmatic attenuation.  EF reduced but likely tachycardia induced.  Patient does have transient ischemic dilatation with TID 1.27 that could indicate multivessel CAD.  He is a former smoker and has a family history of CAD.  Since he just converted to NSR and is on NOAC, would prefer to avoid cath if possible which would require holding NOAC.  Will get a coronary CTA in am to define coronary anatomy.   He does have a contrast allergy so will need prophylaxis prior to the scan.

## 2016-10-15 NOTE — Progress Notes (Addendum)
Patient Name: Adrian Ramos Date of Encounter: 10/15/2016  Primary Cardiologist: Armanda Magicraci Herron Fero, M.D.  Hospital Problem List     Principal Problem:   Atrial fibrillation with RVR (HCC) Active Problems:   Preoperative clearance   Acute combined systolic and diastolic heart failure (HCC)   Alcohol abuse     Subjective   No complaints this am except for anxious to go home  Inpatient Medications    Scheduled Meds: . amiodarone  400 mg Oral BID  . atorvastatin  40 mg Oral q1800  . cholecalciferol  1,000 Units Oral Q supper  . finasteride  5 mg Oral QPM  . metoprolol tartrate  75 mg Oral BID  . omega-3 acid ethyl esters  1 g Oral Q supper  . regadenoson  0.4 mg Intravenous Once  . regadenoson  0.4 mg Intravenous Once  . rivaroxaban  20 mg Oral Q supper  . traZODone  200 mg Oral QHS  . vitamin B-12  100 mcg Oral Daily   PRN Meds: acetaminophen, diazepam, ondansetron (ZOFRAN) IV   Vital Signs    Vitals:   10/14/16 0504 10/14/16 1534 10/14/16 2123 10/15/16 0527  BP: (!) 132/101 (!) 125/97 (!) 139/95 121/86  Pulse: (!) 104 (!) 105 76 64  Resp: 18 14 15    Temp: 97.8 F (36.6 C) 97.3 F (36.3 C) 97.5 F (36.4 C) 98.3 F (36.8 C)  TempSrc: Oral Oral Oral Oral  SpO2: 95% 100% 95% 95%  Weight:      Height:        Intake/Output Summary (Last 24 hours) at 10/15/16 0823 Last data filed at 10/14/16 2132  Gross per 24 hour  Intake              590 ml  Output               75 ml  Net              515 ml   Filed Weights   10/10/16 1558  Weight: 203 lb 3.2 oz (92.2 kg)    Physical Exam    GEN: Morbidly obese, well developed, in no acute distress.  HEENT: Grossly normal.  Neck: Supple, no JVD, carotid bruits, or masses. Cardiac: RRR no murmurs, rubs, or gallops. No clubbing, cyanosis, edema.  Radials/DP/PT 2+ and equal bilaterally.  Respiratory:  Respirations regular and unlabored, clear to auscultation bilaterally. GI: Soft, nontender, nondistended, BS + x  4. MS: no deformity or atrophy. Skin: warm and dry, no rash. Neuro:  Strength and sensation are intact. Psych: AAOx3.  Normal affect.  Labs    CBC No results for input(s): WBC, NEUTROABS, HGB, HCT, MCV, PLT in the last 72 hours. Basic Metabolic Panel No results for input(s): NA, K, CL, CO2, GLUCOSE, BUN, CREATININE, CALCIUM, MG, PHOS in the last 72 hours. Liver Function Tests No results for input(s): AST, ALT, ALKPHOS, BILITOT, PROT, ALBUMIN in the last 72 hours. No results for input(s): LIPASE, AMYLASE in the last 72 hours. Cardiac Enzymes No results for input(s): CKTOTAL, CKMB, CKMBINDEX, TROPONINI in the last 72 hours. BNP Invalid input(s): POCBNP D-Dimer No results for input(s): DDIMER in the last 72 hours. Hemoglobin A1C No results for input(s): HGBA1C in the last 72 hours. Fasting Lipid Panel No results for input(s): CHOL, HDL, LDLCALC, TRIG, CHOLHDL, LDLDIRECT in the last 72 hours. Thyroid Function Tests No results for input(s): TSH, T4TOTAL, T3FREE, THYROIDAB in the last 72 hours.  Invalid input(s): [employer]REET3  Telemetry  Atrial fib with rapid ventricular rate up to 150 bpm. - Personally Reviewed  ECG    No new data - Personally Reviewed  Radiology    No results found.  Cardiac Studies   No new data  Patient Profile     Amanda CockayneMark I Salomonis a 70 y.o.malewith a history of SOB and atrial fibrillation (First noted by PCP 10/03/16)who referred for cardiac surgical clearance prior to undergoing right inguinal hernia repair. Admitted directly from clinic with afib RVR. Duration of atrial fibrillation is unknown. He has a history of heavy alcohol intake (1.5 L daily for greater than 8 years). TEE cardioversion performed 10/12/16 , unsuccessful (brief sinus rhythm 2 with reversion to A. fib). Strategy is still for rhythm control given decreased LV function. Will load with amiodarone as an outpatient and repeat cardioversion in one month.  Assessment & Plan    1.  Persistent atrial fibrillation. Failed TEE cardioversion 10/12/16. He converted last night to sinus bradycardia last night after increasing BB and Amio doses.  Now bradycardic overnight.  HR currently 67bpm.  Will change lopressor to Coreg 6.25mg  BID which will have better BP control and less HR suppression and also for DCM.  Decrease Amio to 200mg  BID and continue this for 2 weeks to load and then 200mg  daily.  Continue Xarelto.  Will get baseline PFTs with DLCO.  2. 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  Starting on Coreg 6.25mg  BID and losartan 25mg  daily.   3.  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  Will make NPO after MN for the stress images - he only got resting images because HR was elevated. This showed an apical perfusion abnormality.  Continue BB and start ARB.  4.  HTN - BP controlled today but due to bradycardia need to back off BB some.  Given DCM will change to Coreg 6.25mg  BID and add Losartan 25mg  daily.   Signed, Armanda Magicraci Nashya Garlington, MD  10/15/2016, 8:23 AM

## 2016-10-16 ENCOUNTER — Encounter (HOSPITAL_COMMUNITY): Payer: Self-pay | Admitting: *Deleted

## 2016-10-16 ENCOUNTER — Inpatient Hospital Stay (HOSPITAL_COMMUNITY): Payer: Medicare Other

## 2016-10-16 DIAGNOSIS — I2510 Atherosclerotic heart disease of native coronary artery without angina pectoris: Secondary | ICD-10-CM

## 2016-10-16 DIAGNOSIS — R9439 Abnormal result of other cardiovascular function study: Secondary | ICD-10-CM

## 2016-10-16 DIAGNOSIS — R911 Solitary pulmonary nodule: Secondary | ICD-10-CM | POA: Diagnosis present

## 2016-10-16 DIAGNOSIS — F1010 Alcohol abuse, uncomplicated: Secondary | ICD-10-CM

## 2016-10-16 HISTORY — DX: Solitary pulmonary nodule: R91.1

## 2016-10-16 LAB — CBC
HCT: 44.4 % (ref 39.0–52.0)
Hemoglobin: 14.6 g/dL (ref 13.0–17.0)
MCH: 30.9 pg (ref 26.0–34.0)
MCHC: 32.9 g/dL (ref 30.0–36.0)
MCV: 94.1 fL (ref 78.0–100.0)
PLATELETS: 154 10*3/uL (ref 150–400)
RBC: 4.72 MIL/uL (ref 4.22–5.81)
RDW: 13.5 % (ref 11.5–15.5)
WBC: 7.4 10*3/uL (ref 4.0–10.5)

## 2016-10-16 LAB — CREATININE, SERUM
CREATININE: 0.7 mg/dL (ref 0.61–1.24)
GFR calc non Af Amer: >60 mL/min (ref >60)

## 2016-10-16 MED ORDER — NITROGLYCERIN 0.4 MG SL SUBL
SUBLINGUAL_TABLET | SUBLINGUAL | Status: AC
  Administered 2016-10-16: 0.8 mg via SUBLINGUAL
  Filled 2016-10-16: qty 2

## 2016-10-16 MED ORDER — CARVEDILOL 6.25 MG PO TABS: 6.2500 mg | ORAL_TABLET | Freq: Two times a day (BID) | ORAL | 11 refills | Status: DC

## 2016-10-16 MED ORDER — LOSARTAN POTASSIUM 25 MG PO TABS: 25.0000 mg | ORAL_TABLET | Freq: Every day | ORAL | 11 refills | Status: DC

## 2016-10-16 MED ORDER — RIVAROXABAN 20 MG PO TABS: 20.0000 mg | ORAL_TABLET | Freq: Every day | ORAL | 0 refills | Status: DC

## 2016-10-16 MED ORDER — METOPROLOL TARTRATE 5 MG/5ML IV SOLN
INTRAVENOUS | Status: AC
  Administered 2016-10-16: 5 mg via INTRAVENOUS
  Filled 2016-10-16: qty 15

## 2016-10-16 MED ORDER — METOPROLOL TARTRATE 5 MG/5ML IV SOLN
5.0000 mg | INTRAVENOUS | Status: DC | PRN
Start: 2016-10-16 — End: 2016-10-16
  Administered 2016-10-16 (×2): 5 mg via INTRAVENOUS

## 2016-10-16 MED ORDER — NITROGLYCERIN 0.4 MG SL SUBL
0.8000 mg | SUBLINGUAL_TABLET | Freq: Once | SUBLINGUAL | Status: AC
Start: 2016-10-16 — End: 2016-10-16
  Administered 2016-10-16: 0.8 mg via SUBLINGUAL

## 2016-10-16 MED ORDER — IOPAMIDOL (ISOVUE-370) INJECTION 76%
INTRAVENOUS | Status: AC
  Administered 2016-10-16: 80 mL
  Filled 2016-10-16: qty 100

## 2016-10-16 MED ORDER — RIVAROXABAN 20 MG PO TABS: 20.0000 mg | ORAL_TABLET | Freq: Every day | ORAL | 11 refills | Status: DC

## 2016-10-16 MED ORDER — AMIODARONE HCL 200 MG PO TABS: 200.0000 mg | ORAL_TABLET | Freq: Two times a day (BID) | ORAL | 11 refills | Status: DC

## 2016-10-16 MED ORDER — ATORVASTATIN CALCIUM 40 MG PO TABS: 40.0000 mg | ORAL_TABLET | Freq: Every day | ORAL | 11 refills | Status: DC

## 2016-10-16 NOTE — Discharge Summary (Addendum)
Discharge Summary    Patient ID: Adrian Ramos,  MRN: [id number], DOB/AGE: 71-Jun-1947 71 y.o.  Admit date: 10/10/2016 Discharge date: 10/16/2016  Primary Care Provider: No PCP Per Patient Primary Cardiologist: Dr Mayford Knife  Discharge Diagnoses    Principal Problem:   Atrial fibrillation with RVR [facility]) Active Problems:   Preoperative clearance   Acute combined systolic and diastolic heart failure (HCC)   Alcohol abuse   Pulmonary nodule, left   Allergies Allergies  Allergen Reactions  . Iodinated Diagnostic Agents Hives and Other (See Comments)    Unknown if MRI or CT was used for pelvis scan? different contrasts between them Developed hives 1 hour after scanning needs 13 hour pre med prior to scan    Diagnostic Studies/Procedures     TEE/DCCV: 15-Oct-2023 - Left ventricle: Systolic function was mildly to moderately   reduced. The estimated ejection fraction was in the range of 40%   to 45%. Wall motion was normal; there were no regional wall   motion abnormalities. - Aortic valve: There was trivial regurgitation. - Left atrium: The atrium was mildly to moderately dilated. No   evidence of thrombus in the atrial cavity or appendage. - Right atrium: The atrium was mildly dilated. No evidence of   thrombus in the atrial cavity or appendage. - Tricuspid valve: No evidence of vegetation. - Pulmonic valve: No evidence of vegetation.  Electrical Cardioversion Procedure Note DAMEER SPEISER [id number] 1946-09-26 Procedure: Electrical Cardioversion Indications:  Atrial Fibrillation Time Out: Verified patient identification, verified procedure,medications/allergies/relevent history reviewed, required imaging and test results available.  Performed Procedure Details The patient was NPO after midnight. Anesthesia was administered at the beside  by Dr.Fitzgerald. Cardioversion was done with synchronized biphasic defibrillation with AP pads with 150watts.  The patient converted to  normal sinus rhythm but quickly went back into atrial fibrillation.  Cardioversion was done with synchronized biphasic defibrillation with AP pads with 200watts which converted the patient to NSR but immediately went back to atrial fibrillation.  The patient tolerated the procedure well  IMPRESSION: Unuccessful cardioversion of atrial fibrillation  ECHO: 10/25 - Left ventricle: The cavity size was normal. There was mild focal   basal hypertrophy of the septum. Systolic function was moderately   reduced. The estimated ejection fraction was in the range of 35%   to 40%. Diffuse hypokinesis. - Aortic valve: Trileaflet; mildly thickened, mildly calcified   leaflets. - Left atrium: The atrium was moderately dilated.  Radiology studies listed below. _____________   History of Present Illness     70 yo male w/ hx ETOH and tob use, anxiety, DOE, was evaluated by Dr Mayford Knife 10/24 preop for hernia surgery and was in rapid atrial fib>>admitted  Hospital Course     Consultants: None   Mr Dill was admitted and placed on IV Cardizem for rate control. That did not control his heart rate and so metoprolol was added as well. He felt better with improved rate control, but was still in atrial fib.   An echo was performed, results above. His EF was decreased and it was possibly from rapid afib (vs ETOH). He had the TEE/DCCV on October 15, 2023, but it was unsuccessful. He immediately reverted back to atrial fibrillation.   The decision was made to start him on amiodarone. As he was being loaded on amiodarone, he spontaneous converted to SR. He had some bradycardia, so the metoprolol was changed to Coreg. Losartan was added for BP control. He is not on Diltiazem.  To assess him for CAD, a Myoview was performed, results below. It was abnormal, with possible CAD. He had a cardiac CT performed for further evaluation, results below. He has moderate CAD, medical therapy recommended.   On 10/30, he was seen by Dr  Duke Salvia and all data were reviewed. He had a pulmonary nodule on CT, this can be followed by a CT in 1 year.   He was ambulating and stated he did well with this. He was maintaining SR. 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 No further inpatient workup was indicated and he is considered stable for discharge, to follow up as and outpatient.   He will need PFTs scheduled as an outpatient.  _____________  Discharge Vitals Blood pressure 122/76, pulse 71, temperature 98 F (36.7 C), temperature source Oral, resp. rate 18, height 5\' 4"  (1.626 m), weight 200 lb 1.6 oz (90.8 kg), SpO2 98 %.  Filed Weights   10/10/16 1558 10/16/16 1225  Weight: 203 lb 3.2 oz (92.2 kg) 200 lb 1.6 oz (90.8 kg)    Labs & Radiologic Studies    CBC  Recent Labs  10/16/16 0159  WBC 7.4  HGB 14.6  HCT 44.4  MCV 94.1  PLT 154   Basic Metabolic Panel BMET    Component Value Date/Time   NA 139 10/10/2016 1818   K 4.0 10/10/2016 1818   CL 105 10/10/2016 1818   CO2 24 10/10/2016 1818   GLUCOSE 123 (H) 10/10/2016 1818   BUN 5 (L) 10/10/2016 1818   CREATININE 0.70 10/16/2016 0159   CALCIUM 8.8 (L) 10/10/2016 1818   GFRNONAA >60 10/16/2016 0159   GFRAA >60 10/16/2016 0159   Liver Function Tests Lab Results  Component Value Date   ALT 26 10/10/2016   AST 25 10/10/2016   ALKPHOS 52 10/10/2016   BILITOT 0.7 10/10/2016   Cardiac Enzymes Troponin I  Date Value Ref Range Status  10/12/2016 <0.03 <0.03 ng/mL Final  10/11/2016 <0.03 <0.03 ng/mL Final   Fasting Lipid Panel Lab Results  Component Value Date   CHOL 163 10/11/2016   HDL 49 10/11/2016   LDLCALC 99 10/11/2016   TRIG 76 10/11/2016   CHOLHDL 3.3 10/11/2016   Thyroid Function Tests Lab Results  Component Value Date   TSH 1.170 10/10/2016   _____________  Nm Myocar Multi W/spect W/wall Motion / Ef Result Date: 10/15/2016 CLINICAL DATA:  Dyspnea on  exertion.  Cardiomyopathy. EXAM: MYOCARDIAL IMAGING WITH SPECT (REST AND PHARMACOLOGIC-STRESS) GATED LEFT VENTRICULAR WALL MOTION STUDY LEFT VENTRICULAR EJECTION FRACTION TECHNIQUE: Standard myocardial SPECT imaging was performed after resting intravenous injection of 10 mCi Tc-35m tetrofosmin. Subsequently, intravenous infusion of Lexiscan was performed under the supervision of the Cardiology staff. At peak effect of the drug, 30 mCi Tc-24m tetrofosmin was injected intravenously and standard myocardial SPECT imaging was performed. Quantitative gated imaging was also performed to evaluate left ventricular wall motion, and estimate left ventricular ejection fraction. COMPARISON:  None. FINDINGS: Perfusion: Large fixed defect resides in the inferoseptal region. No stress-induced ischemia. Wall Motion: Global hypokinesis Left Ventricular Ejection Fraction: 36 % End diastolic volume 159 ml End systolic volume 102 ml IMPRESSION: 1. Large fixed defect in the infero septal region. No stress-induced ischemia. 2. Global hypokinesis. 3. Left ventricular ejection fraction 36% 4. Non invasive risk stratification*: High risk, based on the large inferoseptal fixed defect and LV dilatation. *2012 Appropriate Use Criteria for Coronary Revascularization  Focused Update: J Am Coll Cardiol. 2012;59(9):857-881. http://content.dementiazones.com.aspx?articleid=1201161 Electronically Signed   By: Jolaine Click M.D.   On: 10/15/2016 14:10   Nm Myocar Multi W/spect W/wall Motion / Ef Result Date: 10/11/2016 CLINICAL DATA:  Atrial fibrillation and dyspnea on exertion. Shortness of breath. Stress acquisition could not be performed due to rapid atrial fibrillation. EXAM: MYOCARDIAL IMAGING WITH SPECT (REST) TECHNIQUE: Standard myocardial SPECT imaging was performed after resting intravenous injection of 10 Tc-25m tetrofosmin. Quantitative gated imaging was also performed to evaluate left ventricular wall motion, and estimate left  ventricular ejection fraction. COMPARISON:  None. FINDINGS: Resting SPECT acquisition demonstrates an apical perfusion defect extending into the distal septum and distal inferior wall. IMPRESSION: Rest only acquisition demonstrates apical perfusion defect extending into inferior septal walls. Electronically Signed   By: Irish Lack M.D.   On: 10/11/2016 15:32   Ct Coronary Morph W/cta Cor W/score W/ca W/cm &/or Wo/cm Addendum Date: 10/16/2016   ADDENDUM REPORT: 10/16/2016 13:09 CLINICAL DATA:  70 year old male with chest pain and abnormal stress test. EXAM: Cardiac/Coronary  CT TECHNIQUE: The patient was scanned on a Philips 256 scanner. FINDINGS: A 120 kV prospective scan was triggered in the descending thoracic aorta at 111 HU's. Axial non-contrast 3 mm slices were carried out through the heart. The data set was analyzed on a dedicated work station and scored using the Agatson method. Gantry rotation speed was 270 msecs and collimation was .9 mm. 10 mg of iv Metoprolol and 0.8 mg of sl NTG was given. The 3D data set was reconstructed in 5% intervals of the 67-82 % of the R-R cycle. Diastolic phases were analyzed on a dedicated work station using MPR, MIP and VRT modes. The patient received 80 cc of contrast. Aorta:  Normal size.  No calcifications.  No dissection. Aortic Valve:  Trileaflet.  No calcifications. Coronary Arteries:  Normal coronary origin.  Right dominance. RCA is a large dominant artery that gives rise to PDA and a small PLVB. There minimal calcified plaque in the proximal RCA associated with 0-25% stenosis and mild non-calcified plaque in the mid RCA associated with 25-50% stenosis. There are two PDAs with no significant plaque. PLVB is small and poorly visualized. Left main is a large artery that gives rise to LAD and LCX arteries. There is no plaque in the let main artery. LAD is a large vessel that has moderate non-calcified ostial plaque with associated stenosis 50-75%. Proximal LAD has  mild eccentric calcified plaque with associated stenosis 25-50%. Mid LAD has moderate mixed plaque at the bifurcation to the D1 with associated stenosis 50-75%. Distal LAD has moderate diffuse non-calcified plaque. LCX is a non-dominant artery that gives rise to one small OM1 branch. There is no plaque. Other findings: Normal pulmonary vein drainage into the left atrium. Normal let atrial appendage without a thrombus. Normal size pulmonary artery. IMPRESSION: 1. Coronary calcium score of 97. This was 22 percentile for age and sex matched control. 2. Normal coronary origin with right dominance. 3. Moderate CAD in the LAD and mild CAD in RCA. An aggressive risk factor modification is recommended. 4. Normal size pulmonary artery. Tobias Alexander Electronically Signed   By: Tobias Alexander   On: 10/16/2016 13:09   Result Date: 10/16/2016 EXAM: OVER-READ INTERPRETATION  CT CHEST The following report is an over-read performed by radiologist Dr. Arliss Journey [facility] on 10/16/2016. This over-read does not include interpretation of cardiac or coronary anatomy or pathology. The coronary CTA interpretation by the cardiologist is  attached. COMPARISON:  None. FINDINGS: Mediastinum/Nodes: Normal image thoracic aorta, without dissection or aneurysm. No central pulmonary embolism, on this non-dedicated study. No imaged thoracic adenopathy. Lungs/Pleura: No pleural fluid. Mild bilateral pleural thickening. Left base scarring or subsegmental atelectasis. Calcified left lower lobe granuloma. 2 mm left lower lobe pulmonary nodule on image 19/ series 204. Upper Abdomen: Normal imaged portions of the spleen, adrenal glands, gallbladder. Probable focal steatosis adjacent the falciform ligament. Proximal gastric underdistention. Musculoskeletal: No acute osseous abnormality. IMPRESSION: 1. No acute extracardiac findings in the imaged chest. 2. 2 mm left lower lobe pulmonary nodule. No follow-up needed if patient is  low-risk. Non-contrast chest CT can be considered in 12 months if patient is high-risk. This recommendation follows the consensus statement: Guidelines for Management of Incidental Pulmonary Nodules Detected on CT Images: From the Fleischner Society 2017; Radiology 2017; 284:228-243. Electronically Signed: By: Jeronimo GreavesKyle  Talbot M.D. On: 10/16/2016 12:15   Disposition   Pt is being discharged home today in good condition.  Follow-up Plans & Appointments    Follow-up Information    Rollene RotundaJames Hochrein, MD .   Specialty:  Cardiology Why:  MD or PA/NP, the office will call. Contact information: [address] [place] [address] [phone]          Discharge Instructions    (HEART FAILURE PATIENTS) Call MD:  Anytime you have any of the following symptoms: 1) 3 pound weight gain in 24 hours or 5 pounds in 1 week 2) shortness of breath, with or without a dry hacking cough 3) swelling in the hands, feet or stomach 4) if you have to sleep on extra pillows at night in order to breathe.    Complete by:  As directed    Diet - low sodium heart healthy    Complete by:  As directed    Increase activity slowly    Complete by:  As directed       Discharge Medications   Current Discharge Medication List    START taking these medications   Details  amiodarone (PACERONE) 200 MG tablet Take 1 tablet (200 mg total) by mouth 2 (two) times daily. 1 tab 2 x day for 2 weeks, then 1 tab daily Qty: 50 tablet, Refills: 11    atorvastatin (LIPITOR) 40 MG tablet Take 1 tablet (40 mg total) by mouth daily at 6 PM. Qty: 30 tablet, Refills: 11    carvedilol (COREG) 6.25 MG tablet Take 1 tablet (6.25 mg total) by mouth 2 (two) times daily. Qty: 60 tablet, Refills: 11    losartan (COZAAR) 25 MG tablet Take 1 tablet (25 mg total) by mouth daily. Qty: 30 tablet, Refills: 11    rivaroxaban (XARELTO) 20 MG TABS tablet Take 1 tablet (20 mg total) by mouth daily with supper. Qty: 30 tablet, Refills:  11      CONTINUE these medications which have NOT CHANGED   Details  Ascorbic Acid (VITAMIN C) 1000 MG tablet Take 1,000 mg by mouth daily.    chlorhexidine (PERIDEX) 0.12 % solution 10 mLs by Mouth Rinse route 2 (two) times daily.  Refills: 3    Cholecalciferol (VITAMIN D3 PO) Take 1 tablet by mouth daily with supper.    Coenzyme Q10 (COQ-10 PO) Take 1 capsule by mouth daily with supper.    diazepam (VALIUM) 10 MG tablet Take 10-20 mg by mouth 4 (four) times daily as needed for anxiety.     finasteride (PROSCAR) 5 MG tablet Take 5 mg by mouth  every evening.    MILK THISTLE PO Take 1 tablet by mouth daily with supper.    Omega-3 Fatty Acids (OMEGA 3 PO) Take 1 capsule by mouth daily with supper.    !! OVER THE COUNTER MEDICATION HMF Forte (Probiotic Supplement) daily    !! OVER THE COUNTER MEDICATION Take 1 tablet by mouth every evening. Beta-Sitosterol 60MG  daily     terbinafine (LAMISIL) 1 % cream Apply 1 application topically 2 (two) times daily as needed (irritation).     traZODone (DESYREL) 100 MG tablet Take 200 mg by mouth at bedtime.     VITAMIN A PO Take 1 tablet by mouth daily.    vitamin B-12 (CYANOCOBALAMIN) 100 MCG tablet Take 100 mcg by mouth daily.    vitamin E 400 UNIT capsule Take 400 Units by mouth daily.      !! - Potential duplicate medications found. Please discuss with provider.        Outstanding Labs/Studies   None  Duration of Discharge Encounter   Greater than 30 minutes including physician time.  Melida QuitterSigned, Barrett, Rhonda NP 10/16/2016, 4:40 PM   Patient seen and examined.  Plan as discussed in my rounding note for today and outlined above.   Kniyah Khun C. Duke Salviaandolph, MD, [facility] Center[facility]  10/18/2016   3:48 PM

## 2016-10-16 NOTE — Progress Notes (Signed)
Patient Name: Adrian Ramos Date of Encounter: 10/16/2016  Primary Cardiologist: Dr Lenise Arenaurner  Hospital Problem List     Principal Problem:   Atrial fibrillation with RVR [facility](HCC) Active Problems:   Preoperative clearance   Acute combined systolic and diastolic heart failure (HCC)   Alcohol abuse     Subjective   SOB varies, sometimes w/ minimal exertion. No chest pain or palpitations  Inpatient Medications    Scheduled Meds: . amiodarone  200 mg Oral BID  . atorvastatin  40 mg Oral q1800  . carvedilol  6.25 mg Oral BID  . cholecalciferol  1,000 Units Oral Q supper  . finasteride  5 mg Oral QPM  . losartan  25 mg Oral Daily  . omega-3 acid ethyl esters  1 g Oral Q supper  . regadenoson  0.4 mg Intravenous Once  . regadenoson  0.4 mg Intravenous Once  . rivaroxaban  20 mg Oral Q supper  . traZODone  200 mg Oral QHS  . vitamin B-12  100 mcg Oral Daily   Continuous Infusions:   PRN Meds: acetaminophen, diazepam, metoprolol, ondansetron (ZOFRAN) IV   Vital Signs    Vitals:   10/16/16 0424 10/16/16 1000 10/16/16 1101 10/16/16 1111  BP: 117/80 132/78 140/89 133/81  Pulse: 77 76    Resp: 18 18    Temp: 98 F (36.7 C) 98 F (36.7 C)    TempSrc: Oral Oral    SpO2: 98% 96%    Weight:      Height:        Intake/Output Summary (Last 24 hours) at 10/16/16 1146 Last data filed at 10/16/16 0800  Gross per 24 hour  Intake              720 ml  Output              175 ml  Net              545 ml   Filed Weights   10/10/16 1558  Weight: 203 lb 3.2 oz (92.2 kg)    Physical Exam   GEN: Well nourished, well developed, in no acute distress.  HEENT: Grossly normal.  Neck: Supple, no JVD, carotid bruits, or masses. Cardiac: Irreg R&R, no murmurs, rubs, or gallops. No clubbing, cyanosis, edema.  Radials/DP/PT 2+ and equal bilaterally.  Respiratory:  Respirations regular and unlabored, decreased BS bases bilaterally. GI: Soft, nontender, nondistended, BS + x 4. MS: no  deformity or atrophy.  Skin: warm and dry, no rash. Neuro:  Strength and sensation are intact. Psych: AAOx3.  Normal affect.  Labs    CBC  Recent Labs  10/16/16 0159  WBC 7.4  HGB 14.6  HCT 44.4  MCV 94.1  PLT 154   Basic Metabolic Panel  Recent Labs  10/16/16 0159  CREATININE 0.70   Liver Function Tests Lab Results  Component Value Date   ALT 26 10/10/2016   AST 25 10/10/2016   ALKPHOS 52 10/10/2016   BILITOT 0.7 10/10/2016   Thyroid Function Tests Lab Results  Component Value Date   TSH 1.170 10/10/2016    Telemetry    SR w/ sinus brady high 30s at times, PVCs are frequent at times - Personally Reviewed  ECG    n/a - Personally Reviewed  Radiology    Nm Myocar Multi W/spect W/wall Motion / Ef Result Date: 10/15/2016 CLINICAL DATA:  Dyspnea on exertion.  Cardiomyopathy. EXAM: MYOCARDIAL IMAGING WITH SPECT (REST AND PHARMACOLOGIC-STRESS) GATED LEFT  VENTRICULAR WALL MOTION STUDY LEFT VENTRICULAR EJECTION FRACTION TECHNIQUE: Standard myocardial SPECT imaging was performed after resting intravenous injection of 10 mCi Tc-5266m tetrofosmin. Subsequently, intravenous infusion of Lexiscan was performed under the supervision of the Cardiology staff. At peak effect of the drug, 30 mCi Tc-2566m tetrofosmin was injected intravenously and standard myocardial SPECT imaging was performed. Quantitative gated imaging was also performed to evaluate left ventricular wall motion, and estimate left ventricular ejection fraction. COMPARISON:  None. FINDINGS: Perfusion: Large fixed defect resides in the inferoseptal region. No stress-induced ischemia. Wall Motion: Global hypokinesis Left Ventricular Ejection Fraction: 36 % End diastolic volume 159 ml End systolic volume 102 ml IMPRESSION: 1. Large fixed defect in the infero septal region. No stress-induced ischemia. 2. Global hypokinesis. 3. Left ventricular ejection fraction 36% 4. Non invasive risk stratification*: High risk, based on the  large inferoseptal fixed defect and LV dilatation. *2012 Appropriate Use Criteria for Coronary Revascularization Focused Update: J Am Coll Cardiol. 2012;59(9):857-881. http://content.dementiazones.comonlinejacc.org/article.aspx?articleid=1201161 Electronically Signed   By: Jolaine ClickArthur  Hoss M.D.   On: 10/15/2016 14:10    Cardiac Studies   CARDIAC CT: performed, results pending  Patient Profile     70 yo male w/ hx remote hx ETOH and tob use, anxiety, DOE, was evaluated by Dr Mayford Knifeurner 10/24 preop for hernia surgery and was in atrial fib>>admitted  Assessment & Plan   1.  Persistent atrial fibrillation w/ RVR -His CHADS2VASC score is 1 but due to persistence of atrial fibrillation is on Xarelto 20mg  daily.  - IV Cardizem gtt>>TEE/DCCV 10/26 >> unsuccessful>>amio load and BB increased>> spontaneously converted to SR 10/28 pm but had bradycardia>>amio decreased & BB changed (metoprolol to Coreg) - TSH and LFTs ok - still w/ bradycardia at times but asymptomatic, follow  2. SOB - ? Etiology. Most likely related to afib with RVR. Echo w/ EF 40%, no RWMA.He does have a remote history of smoking and family history of CAD so MV ordered to eval for underlying CAD. 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  - If CT ok, increase activity.  3. Preoperative cardiac clearance - MV was abnl w/ probable diaphragmatic attenuation but had LV dilatation so further eval needed>>Cardiac CT performed after rx for dye allergy, results pending  4. Inguinal hernia needing repair. Given new onset afib, he will need to wait at least 4 weeks after DCCV to come off of NOAC.  Melida QuitterSigned, Gayathri Futrell, PA-C  10/16/2016, 11:46 AM

## 2016-10-16 NOTE — Care Management Important Message (Signed)
Important Message  Patient Details  Name: Adrian Ramos MRN: [id number] Date of Birth: 03/03/1946   Medicare Important Message Given:  Yes    Dorena BodoIris Chavez Rosol 10/16/2016, 5:07 PM

## 2016-10-16 NOTE — Progress Notes (Addendum)
Patient in a a stable condition, this RN went over discharge instructions with patient, they verbalized understanding, iv removed tele dc, ccmd notified, patient belongings at bedside, patient taken off the unit on a wheelchair by this RN

## 2016-10-19 ENCOUNTER — Encounter (HOSPITAL_COMMUNITY): Payer: Medicare Other

## 2016-10-19 NOTE — Addendum Note (Signed)
Addended by: Gunnar FusiKEMP, Phylis Javed A on: 10/19/2016 01:09 PM   Modules accepted: Orders

## 2016-10-20 ENCOUNTER — Telehealth: Payer: Self-pay | Admitting: Cardiology

## 2016-10-20 NOTE — Telephone Encounter (Addendum)
New message      Pt is due to have a pharmacy visit along with his hosp follow up visit on 10-26-16.  He states that his local pharmacist at the pharmacy went over his medications with him and he has the paperwork to bring with him.  Will he still have to see our pharmacist?  Please call

## 2016-10-20 NOTE — Telephone Encounter (Signed)
Confirmed with patient he will need to keep appointment with pharmacist on 11/9.  He was grateful for call back.

## 2016-10-25 NOTE — Progress Notes (Signed)
Patient ID: Adrian ChaletMark I Cowdrey                 DOB: 12/29/1945                      MRN: [id number]    Pharmacy Transitions of Care Visit  HPI: Adrian Ramos is a 70 y.o. male discharged on 10/30 with primary diagnosis of atrial fibrillation. PMH is significant for new onset afib, heart failure with LVEF of 35-40%, and alcohol abuse. Pt underwent unsuccessful cardioversion, was started on diltiazem and metoprolol inpatient for rate control. He was still in afib and was started on amiodarone. He spontaneously converted to sinus rhythm. Pt had some bradycardia, so metoprolol was changed to carvedilol. Losartan was added for BP control. He also underwent a Myoview that showed moderate CAD - he was started on atorvastatin. Patient presents to clinic for pharmacy transitions of care medication reconciliation after hospital discharge.  All medications have been reviewed with patient. Discussed indications for patient's new medications. Pt was started on appropriate dose of Xarelto 20mg  for afib. Baseline LDL 99 - pt was started on atorvastatin 40mg  for moderate CAD. Carvedilol 6.25mg  BID was added for rate control and amiodarone 200mg  BID for 2 weeks then 200mg  daily for rhythm control. He was also started on losartan for BP and reduced EF.  BP today in clinic is low. Patient denies dizziness, falls, or feelings of hypotension. He does not check his BP at home.  Issues/concerns noted are as follows: - Low BP - may need to cut back on carvedilol or losartan dose - Patient has been complaining of black tarry stool which started about a week after Xarelto initiation - He has noticed more swelling in his legs since discharge   Past Medical History:  Diagnosis Date  . Acute medial meniscal injury of knee LEFT  . Anxiety   . Anxiety disorder   . Back pain    right side; occurs more with movement  . BPH (benign prostatic hypertrophy)   . Chronic midline back pain    "right side; last 6 wks" (10/10/2016)  .  Complication of anesthesia 2007   POST-OP URINARY RETENTION  . Dyspnea    on exertion;   . History of urinary retention 2007   POST OP  (BPH W/ BLADDER OUTLET OBSTRUCTION)  . Hypertension   . Insomnia   . Iritis   . Pulmonary nodule, left 10/16/2016  . SOB (shortness of breath) on exertion     Current Outpatient Prescriptions on File Prior to Visit  Medication Sig Dispense Refill  . amiodarone (PACERONE) 200 MG tablet Take 1 tablet (200 mg total) by mouth 2 (two) times daily. 1 tab 2 x day for 2 weeks, then 1 tab daily 50 tablet 11  . Ascorbic Acid (VITAMIN C) 1000 MG tablet Take 1,000 mg by mouth daily.    Marland Kitchen. atorvastatin (LIPITOR) 40 MG tablet Take 1 tablet (40 mg total) by mouth daily at 6 PM. 30 tablet 11  . carvedilol (COREG) 6.25 MG tablet Take 1 tablet (6.25 mg total) by mouth 2 (two) times daily. 60 tablet 11  . chlorhexidine (PERIDEX) 0.12 % solution 10 mLs by Mouth Rinse route 2 (two) times daily.   3  . Cholecalciferol (VITAMIN D3 PO) Take 1 tablet by mouth daily with supper.    . Coenzyme Q10 (COQ-10 PO) Take 1 capsule by mouth daily with supper.    . diazepam (VALIUM) 10 MG  tablet Take 10-20 mg by mouth 4 (four) times daily as needed for anxiety.     . finasteride (PROSCAR) 5 MG tablet Take 5 mg by mouth every evening.    Marland Kitchen. losartan (COZAAR) 25 MG tablet Take 1 tablet (25 mg total) by mouth daily. 30 tablet 11  . MILK THISTLE PO Take 1 tablet by mouth daily with supper.    . Omega-3 Fatty Acids (OMEGA 3 PO) Take 1 capsule by mouth daily with supper.    Marland Kitchen. OVER THE COUNTER MEDICATION HMF Forte (Probiotic Supplement) daily    . OVER THE COUNTER MEDICATION Take 1 tablet by mouth every evening. Beta-Sitosterol 60MG  daily     . rivaroxaban (XARELTO) 20 MG TABS tablet Take 1 tablet (20 mg total) by mouth daily with supper. 30 tablet 11  . terbinafine (LAMISIL) 1 % cream Apply 1 application topically 2 (two) times daily as needed (irritation).     . traZODone (DESYREL) 100 MG  tablet Take 200 mg by mouth at bedtime.     Marland Kitchen. VITAMIN A PO Take 1 tablet by mouth daily.    . vitamin B-12 (CYANOCOBALAMIN) 100 MCG tablet Take 100 mcg by mouth daily.    . vitamin E 400 UNIT capsule Take 400 Units by mouth daily.      No current facility-administered medications on file prior to visit.     Allergies  Allergen Reactions  . Iodinated Diagnostic Agents Hives and Other (See Comments)    Unknown if MRI or CT was used for pelvis scan? different contrasts between them Developed hives 1 hour after scanning needs 13 hour pre med prior to scan    Assessment/Plan:  1. Post-hospital med rec - Discussed pt concerns with Robbie LisBrittainy Simmons, PA. She will adjust BP medications given hypotension today. Will check a CBC and FOBT d/t black tarry stool post-Xarelto initiation. If pt is positive for a GI bleed, Eliquis may be safer DOAC option in the long run given lower rates of GI bleeding. Will address LE swelling with likely addition of Lasix. All patient concerns and medication-related questions were addressed.   Shannie Kontos E. Tristian Bouska, PharmD, CPP [facility] [address], [place], [place] [address] Phone: [phone]; Fax: [phone] 10/26/2016 9:58 AM

## 2016-10-26 ENCOUNTER — Telehealth: Payer: Self-pay | Admitting: Cardiology

## 2016-10-26 ENCOUNTER — Encounter: Payer: Self-pay | Admitting: Cardiology

## 2016-10-26 ENCOUNTER — Ambulatory Visit (INDEPENDENT_AMBULATORY_CARE_PROVIDER_SITE_OTHER): Payer: Medicare Other | Admitting: Pharmacist

## 2016-10-26 ENCOUNTER — Ambulatory Visit (INDEPENDENT_AMBULATORY_CARE_PROVIDER_SITE_OTHER): Payer: Medicare Other | Admitting: Cardiology

## 2016-10-26 VITALS — BP 98/68 | HR 108 | Ht 63.0 in | Wt 200.0 lb

## 2016-10-26 DIAGNOSIS — I5042 Chronic combined systolic (congestive) and diastolic (congestive) heart failure: Secondary | ICD-10-CM

## 2016-10-26 DIAGNOSIS — Z9229 Personal history of other drug therapy: Secondary | ICD-10-CM | POA: Diagnosis not present

## 2016-10-26 DIAGNOSIS — I4891 Unspecified atrial fibrillation: Secondary | ICD-10-CM | POA: Diagnosis not present

## 2016-10-26 DIAGNOSIS — Z7901 Long term (current) use of anticoagulants: Secondary | ICD-10-CM

## 2016-10-26 DIAGNOSIS — Z79899 Other long term (current) drug therapy: Secondary | ICD-10-CM

## 2016-10-26 DIAGNOSIS — Z5181 Encounter for therapeutic drug level monitoring: Secondary | ICD-10-CM

## 2016-10-26 LAB — CBC WITH DIFFERENTIAL/PLATELET
BASOS ABS: 0 {cells}/uL (ref 0–200)
BASOS PCT: 0 %
EOS ABS: 73 {cells}/uL (ref 15–500)
Eosinophils Relative: 1 %
HCT: 35.6 % — ABNORMAL LOW (ref 38.5–50.0)
HEMOGLOBIN: 11.8 g/dL — AB (ref 13.2–17.1)
LYMPHS ABS: 1241 {cells}/uL (ref 850–3900)
Lymphocytes Relative: 17 %
MCH: 30.8 pg (ref 27.0–33.0)
MCHC: 33.1 g/dL (ref 32.0–36.0)
MCV: 93 fL (ref 80.0–100.0)
MONO ABS: 803 {cells}/uL (ref 200–950)
MPV: 11.8 fL (ref 7.5–12.5)
Monocytes Relative: 11 %
NEUTROS ABS: 5183 {cells}/uL (ref 1500–7800)
Neutrophils Relative %: 71 %
Platelets: 171 10*3/uL (ref 140–400)
RBC: 3.83 MIL/uL — ABNORMAL LOW (ref 4.20–5.80)
RDW: 14.3 % (ref 11.0–15.0)
WBC: 7.3 10*3/uL (ref 3.8–10.8)

## 2016-10-26 MED ORDER — PANTOPRAZOLE SODIUM 40 MG PO TBEC: 40.0000 mg | DELAYED_RELEASE_TABLET | Freq: Every day | ORAL | 11 refills | Status: DC

## 2016-10-26 MED ORDER — AMIODARONE HCL 400 MG PO TABS: 400.0000 mg | ORAL_TABLET | Freq: Two times a day (BID) | ORAL | 11 refills | Status: DC

## 2016-10-26 MED ORDER — AMIODARONE HCL 200 MG PO TABS: 400.0000 mg | ORAL_TABLET | Freq: Two times a day (BID) | ORAL | 11 refills | Status: DC

## 2016-10-26 NOTE — Telephone Encounter (Signed)
Per pharmacy pt's insurance will not pay for Amiodarone 400 mg tablets.  I changed to Amiodarone 200 mg,  2 tabs (400 mg) BID.  Insurance covered the 200 mg tablets. FYI.  I spoke to patient and advised of medication strength change.  He voiced understanding and gave verbal read back of new strength and instruction.

## 2016-10-26 NOTE — Progress Notes (Signed)
10/26/2016 Adrian Ramos I Tucholski   08/16/1946  [id number]  Primary Physician Martha ClanShaw, William, MD Primary Cardiologist: Dr. Mayford Knifeurner    Reason for Visit/CC: [facility] f/u for Atrial Fibrillation   HPI:  Mr. Adrian FerrierSolomon is a 70 y/o male, with h/o ETOH and new diagnosis of atrial fibrillation who presents to clinic for post hospital f/u. He was initially referred to Dr. Mayford Knifeurner on 10/10/16 for cardiac surgical clearance prior to undergoing right inguinal hernia repair. At that office visit, he was noted to be in atrial fibrillation w/ RVR and was symptomatic with dyspnea. [facility] and placed on IV Cardizem for rate control. That did not control his heart rate and so metoprolol was added as well. He felt better with improved rate control, but was still in atrial fib.   An echo was performed. His EF was decreased at 35-40% and it was felt possibly from rapid afib (vs ETOH). He was placed on Xarelto for a/c. He had a TEE/DCCV on 10/26, but it was unsuccessful. He immediately reverted back to atrial fibrillation.   The decision was made to start him on amiodarone. As he was being loaded on amiodarone, he spontaneous converted to SR. He had some bradycardia, so metoprolol was changed to Coreg. Losartan was added for BP control. Diltiazem was avoided given systolic dysfunction.  To assess him for CAD, a Myoview was performed. It was abnormal, with Large fixed defect in the infero septal region. No stress-induced ischemia. This was interpreted as a high risk study based on the  large inferoseptal fixed defect and LV dilatation. Subsequently, he had a cardiac CT performed for further evaluation.  This suggested moderate CAD in the LAD and mild CAD in RCA., medical therapy recommended.   Of note, he also was noted to have a pulmonary nodule on CT, this can be followed by a repeat CT in 1 year.   He was discharged home in stable condition on 10/16/16. He presents back to clinic today as a TOC  post hospital f/u. RN telephone call was made 10/20/16. He was seen by our office pharmacist today for medication review. He reports that he has done well. No palpitations or chest pain. No LEE, orthopnea or PND. He does note some darker stools since starting Xarleto. Baseline Hgb was 14 prior to d/c. EKG today shows recurrent atrial fibrillation with a RVR in the low 100s. BP is soft but stable at 98/68. He is asymptomatic.    No outpatient prescriptions have been marked as taking for the 10/26/16 encounter (Office Visit) with Allayne ButcherBrittainy M Simmons, PA-C.   Allergies  Allergen Reactions  . Iodinated Diagnostic Agents Hives and Other (See Comments)    Unknown if MRI or CT was used for pelvis scan? different contrasts between them Developed hives 1 hour after scanning needs 13 hour pre med prior to scan   Past Medical History:  Diagnosis Date  . Acute medial meniscal injury of knee LEFT  . Anxiety   . Anxiety disorder   . Back pain    right side; occurs more with movement  . BPH (benign prostatic hypertrophy)   . Chronic midline back pain    "right side; last 6 wks" (10/10/2016)  . Complication of anesthesia 2007   POST-OP URINARY RETENTION  . Dyspnea    on exertion;   . History of urinary retention 2007   POST OP  (BPH W/ BLADDER OUTLET OBSTRUCTION)  . Hypertension   . Insomnia   .  Iritis   . Pulmonary nodule, left 10/16/2016  . SOB (shortness of breath) on exertion    Family History  Problem Relation Age of Onset  . Cancer Mother   . Cerebrovascular Accident Mother   . Depression Father   . Heart disease Father   . Hypertension Father    Past Surgical History:  Procedure Laterality Date  . BENIGN PENILE GROWTH REMOVED  1985  . CARDIAC CATHETERIZATION  2000   NORMAL  (FALSE POSITIVE STRESS TEST)    . CARDIOVERSION N/A 10/12/2016   Procedure: CARDIOVERSION;  Surgeon: Quintella Reichertraci R Turner, MD;  Location: [facility];  Service: Cardiovascular;  Laterality: N/A;  . CATARACT  EXTRACTION W/ INTRAOCULAR LENS  IMPLANT, BILATERAL Bilateral 06/2015-07/2015  . INGUINAL HERNIA REPAIR Right 07-19-2006  . KNEE ARTHROSCOPY  03/13/2012   Procedure: ARTHROSCOPY KNEE;  Surgeon: Loanne DrillingFrank V Aluisio, MD;  [facility];  Service: Orthopedics;  Laterality: Left;  WITH DEBRIDEMENT medial meniscus  . REPAIR RECURRENT RIGHT INGUINAL HERNIA  12-03-2006  . TEE WITHOUT CARDIOVERSION N/A 10/12/2016   Procedure: TRANSESOPHAGEAL ECHOCARDIOGRAM (TEE);  Surgeon: Quintella Reichertraci R Turner, MD;  [facility];  Service: Cardiovascular;  Laterality: N/A;   Social History   Social History  . Marital status: Single    Spouse name: N/A  . Number of children: N/A  . Years of education: N/A   Occupational History  . Not on file.   Social History Main Topics  . Smoking status: Former Smoker    Years: 25.00    Types: Cigars    Quit date: 12/07/2015  . Smokeless tobacco: Former NeurosurgeonUser    Types: Chew    Quit date: 12/18/1996     Comment: 2 CIGARS DAILY  . Alcohol use Yes     Comment: 10/10/2016 "heavy drinker till aprox 1 month ago"  . Drug use:     Types: Cocaine     Comment: 10/10/2016 "just for 1 yr in 1978"  . Sexual activity: Not Currently   Other Topics Concern  . Not on file   Social History Narrative  . No narrative on file     Review of Systems: General: negative for chills, fever, night sweats or weight changes.  Cardiovascular: negative for chest pain, dyspnea on exertion, edema, orthopnea, palpitations, paroxysmal nocturnal dyspnea or shortness of breath Dermatological: negative for rash Respiratory: negative for cough or wheezing Urologic: negative for hematuria Abdominal: negative for nausea, vomiting, diarrhea, bright red blood per rectum, melena, or hematemesis Neurologic: negative for visual changes, syncope, or dizziness All other systems reviewed and are otherwise negative except as noted above.   Physical Exam:  Blood pressure 98/68, pulse (!)  108, height 5\' 3"  (1.6 m), weight 200 lb (90.7 kg), SpO2 98 %.  General appearance: alert, cooperative, no distress and dentition in poor repair Neck: no carotid bruit and no JVD Lungs: clear to auscultation bilaterally Heart: irregularly irregular rhythm and tachy rate Extremities: extremities normal, atraumatic, no cyanosis or edema Pulses: 2+ and symmetric Skin: Skin color, texture, turgor normal. No rashes or lesions Neurologic: Grossly normal  EKG Atrial fibrillation 108 bpm  ASSESSMENT AND PLAN:   1. PAF: back in atrial fibrillation. V rate poorly controlled in at 108 bpm. BP is soft but stable at 98/68. He is asymptomatic. He reports full compliance with meds including Xarelto. I've discussed with Dr. Elberta Fortisamnitz, DOD. We will increase his amiodarone back to 400 mg BID. QTc is 557 ms (539 hospital baseline), however he has a  RBBB. Per Dr. Elberta Fortis, ok to increase to 400 mg BID. Continue Coreg, 6.25 mg BID. Continue Xarelto. F/u in 7-14 days for repeat EKG. Order PFTs for amiodarone monitoring.   2. 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 Trace bilateral LEE on exam. No dyspnea. Lungs are CTAB. Given soft BP, will hold off on Lasix for now. Continue BB and ARB. He is to monitor weight daily.   3. 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 Continue ARB and BB. Consider repeat echo in 3 months to reassess LVF, once back in SR.  4. CAD: cardiac CT 10/16/16 suggested moderate CAD in the LAD and mild CAD in RCA, medical therapy recommended. He denies anginal symptoms.   5. Chronic Anticoagulation: on Xarelto for a/c given PAF. Will check f/u CBC.   6. ETOH Abuse: he reports that he stopped drinking  7. Dark Stools/ ? Melena: patient notes presence of dark stools since starting Xarelto. Baseline Hgb was 14. We will check a f/u CBC and order FOBT. He denies any recurrent ETOH use. We will add Protonix, 40 mg for GI protection.    PLAN  F/u  with APP in  1- 2 weeks for atrial fibrillation. Patient personally requested to switch primary cardiologist to Dr. Duke Salvia. Will need approval by both physicians regarding this. F/u with Dr. Mayford Knife or Dr. Duke Salvia in 6-8 weeks.   Robbie Lis PA-C 10/26/2016 5:09 PM

## 2016-10-26 NOTE — Telephone Encounter (Signed)
Follow UP:    Pt saw Adrian Ramos today,she increased his Amiodarone to 400 mg.His insurance will not pay for the 400 mg of Amiodarone,they will pay for the 200 mg #120.

## 2016-10-26 NOTE — Patient Instructions (Addendum)
Medication Instructions:  INCREASE Amiodarone to 400 mg twice daily START Protonix 40 mg once daily   Labwork: TODAY - CBC You have been given an order to test for blood in your stool - please take this order to the laboratory of your choice   Testing/Procedures: Your physician has recommended that you have a pulmonary function test. Pulmonary Function Tests are a group of tests that measure how well air moves in and out of your lungs.  Check your weight daily - weigh yourself at the same time and in the same clothing every day and keep a log.  Call our office if your weight increases 3 lb in 1 day or 5 lb in one week.  Follow-Up: Your physician recommends that you return for follow-up appointment with Boyce Medici, PA on Friday Nov. 17 @ 9:30 am   If you need a refill on your cardiac medications before your next appointment, please call your pharmacy.   Thank you for choosing [facility]! Eligha Bridegroom, RN [phone]    Heart Failure Heart failure is a condition in which the heart has trouble pumping blood. This means your heart does not pump blood efficiently for your body to work well. In some cases of heart failure, fluid may back up into your lungs or you may have swelling (edema) in your lower legs. Heart failure is usually a long-term (chronic) condition. It is important for you to take good care of yourself and follow your health care provider's treatment plan. CAUSES  Some health conditions can cause heart failure. Those health conditions include:  High blood pressure (hypertension). Hypertension causes the heart muscle to work harder than normal. When pressure in the blood vessels is high, the heart needs to pump (contract) with more force in order to circulate blood throughout the body. High blood pressure eventually causes the heart to become stiff and weak.  Coronary artery disease (CAD). CAD is the buildup of cholesterol and fat (plaque) in the arteries of  the heart. The blockage in the arteries deprives the heart muscle of oxygen and blood. This can cause chest pain and may lead to a heart attack. High blood pressure can also contribute to CAD.  Heart attack (myocardial infarction). A heart attack occurs when one or more arteries in the heart become blocked. The loss of oxygen damages the muscle tissue of the heart. When this happens, part of the heart muscle dies. The injured tissue does not contract as well and weakens the heart's ability to pump blood.  Abnormal heart valves. When the heart valves do not open and close properly, it can cause heart failure. This makes the heart muscle pump harder to keep the blood flowing.  Heart muscle disease (cardiomyopathy or myocarditis). Heart muscle disease is damage to the heart muscle from a variety of causes. These can include drug or alcohol abuse, infections, or unknown reasons. These can increase the risk of heart failure.  Lung disease. Lung disease makes the heart work harder because the lungs do not work properly. This can cause a strain on the heart, leading it to fail.  Diabetes. Diabetes increases the risk of heart failure. High blood sugar contributes to high fat (lipid) levels in the blood. Diabetes can also cause slow damage to tiny blood vessels that carry important nutrients to the heart muscle. When the heart does not get enough oxygen and food, it can cause the heart to become weak and stiff. This leads to a heart that does not contract  efficiently.  Other conditions can contribute to heart failure. These include abnormal heart rhythms, thyroid problems, and low blood counts (anemia). Certain unhealthy behaviors can increase the risk of heart failure, including:  Being overweight.  Smoking or chewing tobacco.  Eating foods high in fat and cholesterol.  Abusing illicit drugs or alcohol.  Lacking physical activity. SYMPTOMS  Heart failure symptoms may vary and can be hard to detect.  Symptoms may include:  Shortness of breath with activity, such as climbing stairs.  Persistent cough.  Swelling of the feet, ankles, legs, or abdomen.  Unexplained weight gain.  Difficulty breathing when lying flat (orthopnea).  Waking from sleep because of the need to sit up and get more air.  Rapid heartbeat.  Fatigue and loss of energy.  Feeling light-headed, dizzy, or close to fainting.  Loss of appetite.  Nausea.  Increased urination during the night (nocturia). DIAGNOSIS  A diagnosis of heart failure is based on your history, symptoms, physical examination, and diagnostic tests. Diagnostic tests for heart failure may include:  Echocardiography.  Electrocardiography.  Chest X-ray.  Blood tests.  Exercise stress test.  Cardiac angiography.  Radionuclide scans. TREATMENT  Treatment is aimed at managing the symptoms of heart failure. Medicines, behavioral changes, or surgical intervention may be necessary to treat heart failure.  Medicines to help treat heart failure may include:  Angiotensin-converting enzyme (ACE) inhibitors. This type of medicine blocks the effects of a blood protein called angiotensin-converting enzyme. ACE inhibitors relax (dilate) the blood vessels and help lower blood pressure.  Angiotensin receptor blockers (ARBs). This type of medicine blocks the actions of a blood protein called angiotensin. Angiotensin receptor blockers dilate the blood vessels and help lower blood pressure.  Water pills (diuretics). Diuretics cause the kidneys to remove salt and water from the blood. The extra fluid is removed through urination. This loss of extra fluid lowers the volume of blood the heart pumps.  Beta blockers. These prevent the heart from beating too fast and improve heart muscle strength.  Digitalis. This increases the force of the heartbeat.  Healthy behavior changes include:  Obtaining and maintaining a healthy weight.  Stopping smoking  or chewing tobacco.  Eating heart-healthy foods.  Limiting or avoiding alcohol.  Stopping illicit drug use.  Physical activity as directed by your health care provider.  Surgical treatment for heart failure may include:  A procedure to open blocked arteries, repair damaged heart valves, or remove damaged heart muscle tissue.  A pacemaker to improve heart muscle function and control certain abnormal heart rhythms.  An internal cardioverter defibrillator to treat certain serious abnormal heart rhythms.  A left ventricular assist device (LVAD) to assist the pumping ability of the heart. HOME CARE INSTRUCTIONS   Take medicines only as directed by your health care provider. Medicines are important in reducing the workload of your heart, slowing the progression of heart failure, and improving your symptoms.  Do not stop taking your medicine unless directed by your health care provider.  Do not skip any dose of medicine.  Refill your prescriptions before you run out of medicine. Your medicines are needed every day.  Engage in moderate physical activity if directed by your health care provider. Moderate physical activity can benefit some people. The elderly and people with severe heart failure should consult with a health care provider for physical activity recommendations.  Eat heart-healthy foods. Food choices should be free of trans fat and low in saturated fat, cholesterol, and salt (sodium). Healthy choices include fresh  or frozen fruits and vegetables, fish, lean meats, legumes, fat-free or low-fat dairy products, and whole grain or high fiber foods. Talk to a dietitian to learn more about heart-healthy foods.  Limit sodium if directed by your health care provider. Sodium restriction may reduce symptoms of heart failure in some people. Talk to a dietitian to learn more about heart-healthy seasonings.  Use healthy cooking methods. Healthy cooking methods include roasting, grilling,  broiling, baking, poaching, steaming, or stir-frying. Talk to a dietitian to learn more about healthy cooking methods.  Limit fluids if directed by your health care provider. Fluid restriction may reduce symptoms of heart failure in some people.  Weigh yourself every day. Daily weights are important in the early recognition of excess fluid. You should weigh yourself every morning after you urinate and before you eat breakfast. Wear the same amount of clothing each time you weigh yourself. Record your daily weight. Provide your health care provider with your weight record.  Monitor and record your blood pressure if directed by your health care provider.  Check your pulse if directed by your health care provider.  Lose weight if directed by your health care provider. Weight loss may reduce symptoms of heart failure in some people.  Stop smoking or chewing tobacco. Nicotine makes your heart work harder by causing your blood vessels to constrict. Do not use nicotine gum or patches before talking to your health care provider.  Keep all follow-up visits as directed by your health care provider. This is important.  Limit alcohol intake to no more than 1 drink per day for nonpregnant women and 2 drinks per day for men. One drink equals 12 ounces of beer, 5 ounces of wine, or 1 ounces of hard liquor. Drinking more than that is harmful to your heart. Tell your health care provider if you drink alcohol several times a week. Talk with your health care provider about whether alcohol is safe for you. If your heart has already been damaged by alcohol or you have severe heart failure, drinking alcohol should be stopped completely.  Stop illicit drug use.  Stay up-to-date with immunizations. It is especially important to prevent respiratory infections through current pneumococcal and influenza immunizations.  Manage other health conditions such as hypertension, diabetes, thyroid disease, or abnormal heart  rhythms as directed by your health care provider.  Learn to manage stress.  Plan rest periods when fatigued.  Learn strategies to manage high temperatures. If the weather is extremely hot:  Avoid vigorous physical activity.  Use air conditioning or fans or seek a cooler location.  Avoid caffeine and alcohol.  Wear loose-fitting, lightweight, and light-colored clothing.  Learn strategies to manage cold temperatures. If the weather is extremely cold:  Avoid vigorous physical activity.  Layer clothes.  Wear mittens or gloves, a hat, and a scarf when going outside.  Avoid alcohol.  Obtain ongoing education and support as needed.  Participate in or seek rehabilitation as needed to maintain or improve independence and quality of life. SEEK MEDICAL CARE IF:   You have a rapid weight gain.  You have increasing shortness of breath that is unusual for you.  You are unable to participate in your usual physical activities.  You tire easily.  You cough more than normal, especially with physical activity.  You have any or more swelling in areas such as your hands, feet, ankles, or abdomen.  You are unable to sleep because it is hard to breathe.  You feel like your heart  is beating fast (palpitations).  You become dizzy or light-headed upon standing up. SEEK IMMEDIATE MEDICAL CARE IF:   You have difficulty breathing.  There is a change in mental status such as decreased alertness or difficulty with concentration.  You have a pain or discomfort in your chest.  You have an episode of fainting (syncope). MAKE SURE YOU:   Understand these instructions.  Will watch your condition.  Will get help right away if you are not doing well or get worse.   This information is not intended to replace advice given to you by your health care provider. Make sure you discuss any questions you have with your health care provider.   Document Released: 12/04/2005 Document Revised:  04/20/2015 Document Reviewed: 01/03/2013 Elsevier Interactive Patient Education 2016 Elsevier Inc[employer] DASH Eating Plan DASH stands for "Dietary Approaches to Stop Hypertension." The DASH eating plan is a healthy eating plan that has been shown to reduce high blood pressure (hypertension). Additional health benefits may include reducing the risk of type 2 diabetes mellitus, heart disease, and stroke. The DASH eating plan may also help with weight loss. WHAT DO I NEED TO KNOW ABOUT THE DASH EATING PLAN? For the DASH eating plan, you will follow these general guidelines:  Choose foods with a percent daily value for sodium of less than 5% (as listed on the food label).  Use salt-free seasonings or herbs instead of table salt or sea salt.  Check with your health care provider or pharmacist before using salt substitutes.  Eat lower-sodium products, often labeled as "lower sodium" or "no salt added."  Eat fresh foods.  Eat more vegetables, fruits, and low-fat dairy products.  Choose whole grains. Look for the word "whole" as the first word in the ingredient list.  Choose fish and skinless chicken or [place] more often than red meat. Limit fish, poultry, and meat to 6 oz (170 g) each day.  Limit sweets, desserts, sugars, and sugary drinks.  Choose heart-healthy fats.  Limit cheese to 1 oz (28 g) per day.  Eat more home-cooked food and less restaurant, buffet, and fast food.  Limit fried foods.  Cook foods using methods other than frying.  Limit canned vegetables. If you do use them, rinse them well to decrease the sodium.  When eating at a restaurant, ask that your food be prepared with less salt, or no salt if possible. WHAT FOODS CAN I EAT? Seek help from a dietitian for individual calorie needs. Grains Whole grain or whole wheat bread. Brown rice. Whole grain or whole wheat pasta. Quinoa, bulgur, and whole grain cereals. Low-sodium cereals. Corn or whole wheat flour tortillas. Whole  grain cornbread. Whole grain crackers. Low-sodium crackers. Vegetables Fresh or frozen vegetables (raw, steamed, roasted, or grilled). Low-sodium or reduced-sodium tomato and vegetable juices. Low-sodium or reduced-sodium tomato sauce and paste. Low-sodium or reduced-sodium canned vegetables.  Fruits All fresh, canned (in natural juice), or frozen fruits. Meat and Other Protein Products Ground beef (85% or leaner), grass-fed beef, or beef trimmed of fat. Skinless chicken or [place]. Ground chicken or [place]. Pork trimmed of fat. All fish and seafood. Eggs. Dried beans, peas, or lentils. Unsalted nuts and seeds. Unsalted canned beans. Dairy Low-fat dairy products, such as skim or 1% milk, 2% or reduced-fat cheeses, low-fat ricotta or cottage cheese, or plain low-fat yogurt. Low-sodium or reduced-sodium cheeses. Fats and Oils Tub margarines without trans fats. Light or reduced-fat mayonnaise and salad dressings (reduced sodium). Avocado. Safflower, olive, or canola oils. Natural  peanut or almond butter. Other Unsalted popcorn and pretzels. The items listed above may not be a complete list of recommended foods or beverages. Contact your dietitian for more options. WHAT FOODS ARE NOT RECOMMENDED? Grains White bread. White pasta. White rice. Refined cornbread. Bagels and croissants. Crackers that contain trans fat. Vegetables Creamed or fried vegetables. Vegetables in a cheese sauce. Regular canned vegetables. Regular canned tomato sauce and paste. Regular tomato and vegetable juices. Fruits Dried fruits. Canned fruit in light or heavy syrup. Fruit juice. Meat and Other Protein Products Fatty cuts of meat. Ribs, chicken wings, bacon, sausage, bologna, salami, chitterlings, fatback, hot dogs, bratwurst, and packaged luncheon meats. Salted nuts and seeds. Canned beans with salt. Dairy Whole or 2% milk, cream, half-and-half, and cream cheese. Whole-fat or sweetened yogurt. Full-fat cheeses or blue  cheese. Nondairy creamers and whipped toppings. Processed cheese, cheese spreads, or cheese curds. Condiments Onion and garlic salt, seasoned salt, table salt, and sea salt. Canned and packaged gravies. Worcestershire sauce. Tartar sauce. Barbecue sauce. Teriyaki sauce. Soy sauce, including reduced sodium. Steak sauce. Fish sauce. Oyster sauce. Cocktail sauce. Horseradish. Ketchup and mustard. Meat flavorings and tenderizers. Bouillon cubes. Hot sauce. Tabasco sauce. Marinades. Taco seasonings. Relishes. Fats and Oils Butter, stick margarine, lard, shortening, ghee, and bacon fat. Coconut, palm kernel, or palm oils. Regular salad dressings. Other Pickles and olives. Salted popcorn and pretzels. The items listed above may not be a complete list of foods and beverages to avoid. Contact your dietitian for more information. WHERE CAN I FIND MORE INFORMATION? National Heart, Lung, and Blood Institute: CableProm[employer].itwww.nhlbi.nih.gov/health/health-topics/topics/dash/   This information is not intended to replace advice given to you by your health care provider. Make sure you discuss any questions you have with your health care provider.   Document Released: 11/23/2011 Document Revised: 12/25/2014 Document Reviewed: 10/08/2013 Elsevier Interactive Patient Education [employer]vier Inc[employer]

## 2016-10-27 ENCOUNTER — Other Ambulatory Visit: Payer: Self-pay | Admitting: Cardiology

## 2016-10-28 ENCOUNTER — Other Ambulatory Visit: Payer: Self-pay | Admitting: Cardiology

## 2016-10-29 ENCOUNTER — Other Ambulatory Visit: Payer: Self-pay | Admitting: Cardiology

## 2016-10-30 ENCOUNTER — Encounter: Payer: Self-pay | Admitting: Cardiology

## 2016-10-31 LAB — FECAL OCCULT BLOOD, IMMUNOCHEMICAL
FECAL OCCULT BLD: POSITIVE — AB
Fecal Occult Bld: POSITIVE — AB
Fecal Occult Bld: POSITIVE — AB

## 2016-11-01 ENCOUNTER — Other Ambulatory Visit: Payer: Medicare Other | Admitting: *Deleted

## 2016-11-01 ENCOUNTER — Telehealth: Payer: Self-pay | Admitting: *Deleted

## 2016-11-01 DIAGNOSIS — R195 Other fecal abnormalities: Secondary | ICD-10-CM

## 2016-11-01 DIAGNOSIS — I4891 Unspecified atrial fibrillation: Secondary | ICD-10-CM

## 2016-11-01 LAB — CBC WITH DIFFERENTIAL/PLATELET
BASOS ABS: 0 {cells}/uL (ref 0–200)
Basophils Relative: 0 %
EOS PCT: 1 %
Eosinophils Absolute: 72 cells/uL (ref 15–500)
HCT: 31.2 % — ABNORMAL LOW (ref 38.5–50.0)
HEMOGLOBIN: 10.4 g/dL — AB (ref 13.2–17.1)
LYMPHS ABS: 936 {cells}/uL (ref 850–3900)
LYMPHS PCT: 13 %
MCH: 30.3 pg (ref 27.0–33.0)
MCHC: 33.3 g/dL (ref 32.0–36.0)
MCV: 91 fL (ref 80.0–100.0)
MONOS PCT: 11 %
MPV: 11.1 fL (ref 7.5–12.5)
Monocytes Absolute: 792 cells/uL (ref 200–950)
NEUTROS PCT: 75 %
Neutro Abs: 5400 cells/uL (ref 1500–7800)
PLATELETS: 219 10*3/uL (ref 140–400)
RBC: 3.43 MIL/uL — AB (ref 4.20–5.80)
RDW: 14.2 % (ref 11.0–15.0)
WBC: 7.2 10*3/uL (ref 3.8–10.8)

## 2016-11-01 LAB — IRON: IRON: 25 ug/dL — AB (ref 50–180)

## 2016-11-01 LAB — FERRITIN: FERRITIN: 46 ng/mL (ref 20–380)

## 2016-11-01 NOTE — Progress Notes (Signed)
ron

## 2016-11-01 NOTE — Addendum Note (Signed)
Addended by: Tonita PhoenixBOWDEN, ROBIN K on: 11/01/2016 11:56 AM   Modules accepted: Orders

## 2016-11-01 NOTE — Telephone Encounter (Signed)
Pt has been made aware of his + blood occult stool test.  He will come in office for labs today and f/u with Boyce MediciBrittany Simmons, PA-C 11-03-16.

## 2016-11-01 NOTE — Addendum Note (Signed)
Addended by: Charnese Federici K on: 11/01/2016 11:58 AM   Modules accepted: Orders  

## 2016-11-01 NOTE — Addendum Note (Signed)
Addended by: Tonita PhoenixBOWDEN, ROBIN K on: 11/01/2016 11:58 AM   Modules accepted: Orders

## 2016-11-03 ENCOUNTER — Ambulatory Visit (INDEPENDENT_AMBULATORY_CARE_PROVIDER_SITE_OTHER): Payer: Medicare Other | Admitting: Cardiology

## 2016-11-03 ENCOUNTER — Encounter: Payer: Self-pay | Admitting: Cardiology

## 2016-11-03 ENCOUNTER — Ambulatory Visit: Payer: Medicare Other | Admitting: Cardiology

## 2016-11-03 VITALS — BP 96/60 | HR 54 | Ht 64.0 in | Wt 197.1 lb

## 2016-11-03 DIAGNOSIS — K922 Gastrointestinal hemorrhage, unspecified: Secondary | ICD-10-CM | POA: Insufficient documentation

## 2016-11-03 DIAGNOSIS — Z79899 Other long term (current) drug therapy: Secondary | ICD-10-CM | POA: Diagnosis not present

## 2016-11-03 DIAGNOSIS — I4891 Unspecified atrial fibrillation: Secondary | ICD-10-CM

## 2016-11-03 DIAGNOSIS — R195 Other fecal abnormalities: Secondary | ICD-10-CM

## 2016-11-03 DIAGNOSIS — I5042 Chronic combined systolic (congestive) and diastolic (congestive) heart failure: Secondary | ICD-10-CM | POA: Diagnosis not present

## 2016-11-03 DIAGNOSIS — E611 Iron deficiency: Secondary | ICD-10-CM

## 2016-11-03 LAB — CBC
HCT: 29.8 % — ABNORMAL LOW (ref 38.5–50.0)
HEMOGLOBIN: 9.9 g/dL — AB (ref 13.2–17.1)
MCH: 30.8 pg (ref 27.0–33.0)
MCHC: 33.2 g/dL (ref 32.0–36.0)
MCV: 92.8 fL (ref 80.0–100.0)
MPV: 10.9 fL (ref 7.5–12.5)
Platelets: 214 10*3/uL (ref 140–400)
RBC: 3.21 MIL/uL — AB (ref 4.20–5.80)
RDW: 14.2 % (ref 11.0–15.0)
WBC: 6.5 10*3/uL (ref 3.8–10.8)

## 2016-11-03 MED ORDER — IRON 325 (65 FE) MG PO TABS: 1.0000 | ORAL_TABLET | Freq: Every day | ORAL | 0 refills | Status: DC

## 2016-11-03 MED ORDER — AMIODARONE HCL 200 MG PO TABS: 400.0000 mg | ORAL_TABLET | Freq: Every day | ORAL | 1 refills | Status: DC

## 2016-11-03 NOTE — Progress Notes (Signed)
11/03/2016 Adrian Ramos   09/07/1946  [id number]  Primary Physician Martha ClanShaw, William, MD Primary Cardiologist: Dr. Duke Salviaandolph   Reason for Visit/CC: Atrial Fibrillation   HPI:  Adrian Ramos is a 70 y/o male, with h/o ETOH and new diagnosis of atrial fibrillation who presents to clinic for follow-up. He was initially referred to Dr. Mayford Knifeurner on 10/10/16 for cardiac surgical clearance prior to undergoing right inguinal hernia repair. At that office visit, he was noted to be in atrial fibrillation w/ RVR and was symptomatic with dyspnea. He was dirrectly [facility] and placed on IV Cardizem for rate control. That did not control his heart rate and so metoprolol was added as well. He felt better with improved rate control, but was still in atrial fib.   An echo was performed. His EF was decreased at 35-40% and it was felt possibly from rapid afib (vs ETOH). He was placed on Xarelto for a/c. He had a TEE/DCCV on 10/26, but it was unsuccessful. He immediately reverted back to atrial fibrillation.   The decision was made to start him on amiodarone. As he was being loaded on amiodarone, he spontaneous converted to SR. He had some bradycardia, so metoprolol was changed to Coreg. Losartan was added for BP control. Diltiazem was avoided given systolic dysfunction.  To assess him for CAD, a Myoview was performed. It was abnormal, with Large fixed defect in the infero septal region. No stress-induced ischemia. This was interpreted as a high risk study based on the  large inferoseptal fixed defect and LV dilatation. Subsequently, he had a cardiac CT performed for further evaluation.  This suggested moderate CAD in the LAD and mild CAD in RCA., medical therapy recommended.   Of note, he also was noted to have a pulmonary nodule on CT, this can be followed by a repeat CT in 1 year.   He was discharged home in stable condition on 10/16/16. He was seen back in clinic on 10/26/16  as a TOC post hospital f/u.  EKG showed that he had reverted back into Afib. He had reported full compliance with Xarelto and ahd also noted melenotic stools. A CBC was checked and Hgb had dropped from 14>>11. His amiodarone was increased to 400 mg BID and Protonix was added for GI protection. Patient had denied any prior ETOH use. I also ordered a repeat CBC, ferritin, iron level and FOBT.   He presents back to clinic today for f/u. EKG shows NSR. HR is well controlled. BP is stable. He states that he did not tolerate Protonix well. He had severe diarrrhea shortly after taking it and stopped. His diarrhea resolved. Since then, he denies any further melena. His BMs over the past 2 days have been normal in color and consistency. However, prior to the cessation of his melena, his FOBT was +. Hgb had further dropped from 11>>10. Ferritin is WNL but iron level low.   Current Meds  Medication Sig  . amiodarone (PACERONE) 200 MG tablet Take 2 tablets (400 mg total) by mouth 2 (two) times daily.  . Ascorbic Acid (VITAMIN C) 1000 MG tablet Take 1,000 mg by mouth daily.  Marland Kitchen. atorvastatin (LIPITOR) 40 MG tablet Take 1 tablet (40 mg total) by mouth daily at 6 PM.  . carvedilol (COREG) 6.25 MG tablet Take 1 tablet (6.25 mg total) by mouth 2 (two) times daily.  . chlorhexidine (PERIDEX) 0.12 % solution 10 mLs by Mouth Rinse route 2 (two) times daily.   . Cholecalciferol (  VITAMIN D3 PO) Take 1 tablet by mouth daily with supper.  . Clotrimazole 1 % OINT Apply topically.  . Coenzyme Q10 (COQ-10 PO) Take 1 capsule by mouth daily with supper.  . diazepam (VALIUM) 10 MG tablet Take 10-20 mg by mouth 4 (four) times daily as needed for anxiety.   . finasteride (PROSCAR) 5 MG tablet Take 5 mg by mouth every evening.  Marland Kitchen losartan (COZAAR) 25 MG tablet Take 1 tablet (25 mg total) by mouth daily.  Marland Kitchen MILK THISTLE PO Take 1 tablet by mouth daily with supper.  . Omega-3 Fatty Acids (OMEGA 3 PO) Take 1 capsule by mouth daily with supper.  Marland Kitchen OVER THE COUNTER  MEDICATION HMF Forte (Probiotic Supplement) daily  . OVER THE COUNTER MEDICATION Take 1 tablet by mouth every evening. Beta-Sitosterol 60MG  daily   . rivaroxaban (XARELTO) 20 MG TABS tablet Take 1 tablet (20 mg total) by mouth daily with supper.  . traZODone (DESYREL) 100 MG tablet Take 200 mg by mouth at bedtime.   Marland Kitchen VITAMIN A PO Take 1 tablet by mouth daily.  . vitamin B-12 (CYANOCOBALAMIN) 100 MCG tablet Take 100 mcg by mouth daily.  . vitamin E 400 UNIT capsule Take 400 Units by mouth daily.   . [DISCONTINUED] terbinafine (LAMISIL) 1 % cream Apply 1 application topically 2 (two) times daily as needed (irritation).    Allergies  Allergen Reactions  . Iodinated Diagnostic Agents Hives and Other (See Comments)    Unknown if MRI or CT was used for pelvis scan? different contrasts between them Developed hives 1 hour after scanning needs 13 hour pre med prior to scan   Past Medical History:  Diagnosis Date  . Acute medial meniscal injury of knee LEFT  . Anxiety   . Anxiety disorder   . Back pain    right side; occurs more with movement  . BPH (benign prostatic hypertrophy)   . Chronic midline back pain    "right side; last 6 wks" (10/10/2016)  . Complication of anesthesia 2007   POST-OP URINARY RETENTION  . Dyspnea    on exertion;   . History of urinary retention 2007   POST OP  (BPH W/ BLADDER OUTLET OBSTRUCTION)  . Hypertension   . Insomnia   . Iritis   . Pulmonary nodule, left 10/16/2016  . SOB (shortness of breath) on exertion    Family History  Problem Relation Age of Onset  . Cancer Mother   . Cerebrovascular Accident Mother   . Depression Father   . Heart disease Father   . Hypertension Father    Past Surgical History:  Procedure Laterality Date  . BENIGN PENILE GROWTH REMOVED  1985  . CARDIAC CATHETERIZATION  2000   NORMAL  (FALSE POSITIVE STRESS TEST)    . CARDIOVERSION N/A 10/12/2016   Procedure: CARDIOVERSION;  Surgeon: Quintella Reichert, MD;  Location: [facility]  ENDOSCOPY;  Service: Cardiovascular;  Laterality: N/A;  . CATARACT EXTRACTION W/ INTRAOCULAR LENS  IMPLANT, BILATERAL Bilateral 06/2015-07/2015  . INGUINAL HERNIA REPAIR Right 07-19-2006  . KNEE ARTHROSCOPY  03/13/2012   Procedure: ARTHROSCOPY KNEE;  Surgeon: Loanne Drilling, MD;  Location: [facility];  Service: Orthopedics;  Laterality: Left;  WITH DEBRIDEMENT medial meniscus  . REPAIR RECURRENT RIGHT INGUINAL HERNIA  12-03-2006  . TEE WITHOUT CARDIOVERSION N/A 10/12/2016   Procedure: TRANSESOPHAGEAL ECHOCARDIOGRAM (TEE);  Surgeon: Quintella Reichert, MD;  Location: [facility] ENDOSCOPY;  Service: Cardiovascular;  Laterality: N/A;   Social History  Social History  . Marital status: Single    Spouse name: N/A  . Number of children: N/A  . Years of education: N/A   Occupational History  . Not on file.   Social History Main Topics  . Smoking status: Former Smoker    Years: 25.00    Types: Cigars    Quit date: 12/07/2015  . Smokeless tobacco: Former Neurosurgeon    Types: Chew    Quit date: 12/18/1996     Comment: 2 CIGARS DAILY  . Alcohol use Yes     Comment: 10/10/2016 "heavy drinker till aprox 1 month ago"  . Drug use:     Types: Cocaine     Comment: 10/10/2016 "just for 1 yr in 1978"  . Sexual activity: Not Currently   Other Topics Concern  . Not on file   Social History Narrative  . No narrative on file     Review of Systems: General: negative for chills, fever, night sweats or weight changes.  Cardiovascular: negative for chest pain, dyspnea on exertion, edema, orthopnea, palpitations, paroxysmal nocturnal dyspnea or shortness of breath Dermatological: negative for rash Respiratory: negative for cough or wheezing Urologic: negative for hematuria Abdominal: negative for nausea, vomiting, diarrhea, bright red blood per rectum, melena, or hematemesis Neurologic: negative for visual changes, syncope, or dizziness All other systems reviewed and are otherwise negative except  as noted above.   Physical Exam:  Blood pressure 96/60, pulse (!) 54, height 5\' 4"  (1.626 m), weight 197 lb 1.9 oz (89.4 kg), SpO2 96 %.  General appearance: alert, cooperative and no distress Neck: no carotid bruit and no JVD Lungs: clear to auscultation bilaterally Heart: regular rate and rhythm, S1, S2 normal, no murmur, click, rub or gallop Extremities: trace bilateral LEE Pulses: 2+ and symmetric Skin: Skin color, texture, turgor normal. No rashes or lesions Neurologic: Grossly normal  EKG sinus bradycardia. 55 bpm.   ASSESSMENT AND PLAN:   1. PAF: EKG shows patient is back in NSR. HR is controlled. We will titrate his amiodarone back down to 400 mg daily (200 mg BID), until he follows-up with Dr. Duke Salvia in 10 days. Continue Coreg, 6.25 mg BID. Continue Xarelto. He is scheduled for PFTs for amiodarone monitoring.   2. 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 Trace bilateral LEE on exam. No dyspnea. Lungs are CTAB. Given soft BP, will hold off on Lasix for now. Continue BB and ARB. He is to monitor weight daily.   3. 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 Continue ARB and BB. Consider repeat echo in 3 months to reassess LVF, once back in SR.  4. CAD: cardiac CT 10/16/16 suggested moderate CAD in the LAD and mild CAD in RCA, medical therapy recommended. He denies anginal symptoms.   5. Chronic Anticoagulation: on Xarelto for a/c given PAF. Will check f/u CBC.   6. ETOH Abuse: he reports that he stopped drinking.   7. GIB/Anemia: patient had several occurences of dark stools after starting Xarelto. Baseline Hgb was 14. Further drop to 11>>10. Iron level low. Ferritin WNL. FOBT was + however patient denies any recurrent melena over the last 2 days. He has had 2 BMs most recently, which have been normal in color. I've discussed with Dr. Delton See, DOD. We will repeat a CBC today to ensure H/H is stable and not further dropping. Will add  supplemental Fe, 325 mg daily. Stool softener recommended. Pt was unable to tolerate Protonix given sever diarrhea. He has  refrained for further ETOH use. Pt instructed to continue Xarelto for now given recent electrical cardioversion <4 weeks ago and recent chemical cardioversion with amiodarone. We will arrange close f/u with Dr. Duke Salviaandolph in 7-10 days for repeat assessment.    Robbie LisBrittainy Mackenzey Crownover PA-C 11/03/2016 9:50 AM

## 2016-11-03 NOTE — Patient Instructions (Addendum)
Medication Instructions:  Your physician has recommended you make the following change in your medication:  1.  START over the counter Iron 325 mg taking 1 daily 2.  START over the counter stool softener  3.  DECREASE the Amiodarone 200 mg taking 2 tablets daily  Labwork:   TODAY:  CBC  Testing/Procedures: None ordered  Follow-Up: Your physician recommends that you schedule a follow-up appointment in: WITH DR. [place] 11-14-16 WITH DR. Perla ARRIVING AT 8:30  Any Other Special Instructions Will Be Listed Below (If Applicable).   If you need a refill on your cardiac medications before your next appointment, please call your pharmacy.

## 2016-11-05 ENCOUNTER — Encounter: Payer: Self-pay | Admitting: Cardiology

## 2016-11-06 ENCOUNTER — Telehealth: Payer: Self-pay

## 2016-11-06 NOTE — Telephone Encounter (Signed)
Mr.Quach is returning a call . Please call .Marland Kitchen. Thanks

## 2016-11-06 NOTE — Telephone Encounter (Signed)
Patient called to report dark, tarry, formed stools over the weekend and today. He states there is no frank blood present. He denies CP, weakness, dizziness. He has been taking medications as directed (other than Protonix- he reported "not taking" at B. Sharol HarnessSimmons' OV Friday). He has no VS to report. Per Dr. Johney FrameAllred, patient is to continue current medications and see his GI MD or PCP tomorrow. The patient states has no GI doctor yet and he has yet to establish with his PCP - his first OV is in January. Informed the patient a nurse from [facility] will call his PCP in the AM to see if OV could be rescheduled ASAP. He understands to go to Urgent Care in the meantime if symptoms occur or bleeding worsens.   The patient also states his R leg is a "little swollen." He reports he follows a lot salt diet and he does not remember hurting the leg. He keeps it elevated. He reports no redness or pain.  He states his weight has stayed consistent within 1 pound. [facility].  He understands he will be called in the morning for PCP appointment or further instructions.

## 2016-11-06 NOTE — Telephone Encounter (Signed)
Received MyChart message below from patient. Called to discuss with patient.  Left message to call back.  From: Drucilla ChaletMark I Agudelo  Sent: 11/05/2016  5:11 PM  To: [facility] Clinical Pool  Subject: Visit Follow-Up Question               Dear Adrian Ramos,    At the conclusion of our last meeting on Friday, November 17, you instructed me to call you immediately if there occurred any change in my stool. Sadly, I must report to you that beginning on Saturday, 11/18 and continuing today, Sunday, November 19, my stool is black once again.    I can be reached at [phone]. I will be away from the house for a dental appointment between 9:15-11:00 AM, Monday morning.    Please advise.    Thank you,  Adrian Ramos

## 2016-11-07 NOTE — Telephone Encounter (Signed)
LMVM for Tresa EndoKelly at Dr Alver FisherShaw's office.

## 2016-11-07 NOTE — Telephone Encounter (Signed)
I spoke with Synetta FailAnita at Dr Samuel GermanyWilliam Shaw's office. Synetta Failnita states Dr Clelia CroftShaw will need to be involved in decision about sooner appt for pt for evaluation of dark,tarry stools, and I was transferred to voice mail for Dr Alver FisherShaw's assistant, Tresa EndoKelly. I left a voice mail message for [place] with information and requested a call back.

## 2016-11-07 NOTE — Telephone Encounter (Signed)
Pt is aware he has been scheduled to see Dr Clelia CroftShaw 11/13/16 at 4 PM. Pt has a PFT scheduled for 11/13/16, he does not feel he would be able to get a good result at this time.  I reviewed with [place] Simmons-per [place]:  Okay to cancel PFT at this time. Pt should keep appt already scheduled with Dr Duke Salviaandolph 11/14/16. Pt should report to ED for further evaluation if any change in symptoms, obvious bleeding, more frequent tarry stools, increase shortness of breath, weakness, dizziness.   Pt has been advised of Brittany's recommendations aware to call us if questions.

## 2016-11-08 ENCOUNTER — Ambulatory Visit: Payer: Medicare Other | Admitting: Cardiology

## 2016-11-13 ENCOUNTER — Encounter (HOSPITAL_COMMUNITY): Payer: Medicare Other

## 2016-11-13 NOTE — Progress Notes (Signed)
Cardiology Office Note   Date:  11/14/2016   ID:  Adrian Ramos, DOB 1946/11/19, MRN [id number]  PCP:  Martha Clan, MD  Cardiologist:   Chilton Si, MD   Chief Complaint  Patient presents with  . Follow-up      History of Present Illness: Adrian Ramos is a 70 y.o. male with chronic systolic and diastolic heart failure, paroxysmal atrial fibrillation, alcohol abuse, who presents for follow up. Adrian Ramos originally saw Dr. Mayford Knife on 10/10/73 surgical clearance prior to inguinal hernia repair. At that appointment he was noted to be in atrial fibrillation with rapid ventricular response. He was also short of breath. He was admitted to [facility]. Echocardiogram that hospitalization revealed LVEF 35-40%. It was unclear whether his heart failure was due to atrial fibrillation with rapid ventricular response or alcohol abuse. He was started on Xarelto and underwent TEE/DCCV on 10/12/16.  This was unsuccessful and he went back into atrial fibrillation. He was subsequently started on amiodarone and spontaneously converted to sinus rhythm.  He had a [place] Myoview that revealed a large, fixed defect in the inferoseptal region. There is no inducible ischemia. He then had a cardiac CT that showed moderate coronary artery disease in the LAD and mild atherosclerosis in the RCA.  He was subsequently noted to be back in atrial fibrillation in clinic on 10/26/16.  His hemoglobin dropped from 14 to 11 and he was started on a PPI.  He saw Boyce Medici, PA-C, on 11/17 and was back in sinus rhythm and doing well.    Since his last appointment Adrian Ramos has noted lower extremity edema.  This started around 10/29/16.   He attributes this to the protonix that was started.   He also noted diarrhea with hematochezia after starting Pronix and improved after he stopped the medication.  Initially after leaving the hospital he had black melena.  More recently his stool has been very dark brown.   He notes R>L lower extremity edema that does not improve with elevation of his legs. He denies orthopnea or PND.  His weight has ranged from 193 to 199 and most recently has been 198-199.  He saw his PCP yesterday and it was recommended that he start lasix 40mg  daily.  He hasn't yet started this medication.  He also had a CBC and CMP checked but those results are not yet available.  He was referred to GI.  He denies palpitations or chest pain but does not exertional shortness of breath.  He was walking up to 13 minutes at a time but is no longer able to do this.  He has abstained from alcohol since discharge.   Past Medical History:  Diagnosis Date  . Acute medial meniscal injury of knee LEFT  . Anxiety   . Anxiety disorder   . Back pain    right side; occurs more with movement  . BPH (benign prostatic hypertrophy)   . Chronic midline back pain    "right side; last 6 wks" (10/10/2016)  . Complication of anesthesia 2007   POST-OP URINARY RETENTION  . Dyspnea    on exertion;   . History of urinary retention 2007   POST OP  (BPH W/ BLADDER OUTLET OBSTRUCTION)  . Hypertension   . Insomnia   . Iritis   . Pulmonary nodule, left 10/16/2016  . SOB (shortness of breath) on exertion     Past Surgical History:  Procedure Laterality Date  . BENIGN PENILE GROWTH  REMOVED  1985  . CARDIAC CATHETERIZATION  2000   NORMAL  (FALSE POSITIVE STRESS TEST)    . CARDIOVERSION N/A 10/12/2016   Procedure: CARDIOVERSION;  Surgeon: Quintella Reichert, MD;  Location: [facility] ENDOSCOPY;  Service: Cardiovascular;  Laterality: N/A;  . CATARACT EXTRACTION W/ INTRAOCULAR LENS  IMPLANT, BILATERAL Bilateral 06/2015-07/2015  . INGUINAL HERNIA REPAIR Right 07-19-2006  . KNEE ARTHROSCOPY  03/13/2012   Procedure: ARTHROSCOPY KNEE;  Surgeon: Loanne Drilling, MD;  Location: [facility];  Service: Orthopedics;  Laterality: Left;  WITH DEBRIDEMENT medial meniscus  . REPAIR RECURRENT RIGHT INGUINAL HERNIA  12-03-2006  .  TEE WITHOUT CARDIOVERSION N/A 10/12/2016   Procedure: TRANSESOPHAGEAL ECHOCARDIOGRAM (TEE);  Surgeon: Quintella Reichert, MD;  Location: [facility] ENDOSCOPY;  Service: Cardiovascular;  Laterality: N/A;     Current Outpatient Prescriptions  Medication Sig Dispense Refill  . amiodarone (PACERONE) 200 MG tablet Take 2 tablets (400 mg total) by mouth daily. 60 tablet 1  . Ascorbic Acid (VITAMIN C) 1000 MG tablet Take 1,000 mg by mouth daily.    Marland Kitchen atorvastatin (LIPITOR) 40 MG tablet Take 1 tablet (40 mg total) by mouth daily at 6 PM. 30 tablet 11  . carvedilol (COREG) 6.25 MG tablet Take 1 tablet (6.25 mg total) by mouth 2 (two) times daily. 60 tablet 11  . chlorhexidine (PERIDEX) 0.12 % solution 10 mLs by Mouth Rinse route 2 (two) times daily.   3  . Cholecalciferol (VITAMIN D3 PO) Take 1 tablet by mouth daily with supper.    . Clotrimazole 1 % OINT Apply topically.    . Coenzyme Q10 (COQ-10 PO) Take 1 capsule by mouth daily with supper.    . diazepam (VALIUM) 10 MG tablet Take 10-20 mg by mouth 4 (four) times daily as needed for anxiety.     . Ferrous Sulfate (IRON) 325 (65 Fe) MG TABS Take 1 tablet by mouth daily. 30 each 0  . finasteride (PROSCAR) 5 MG tablet Take 5 mg by mouth every evening.    . furosemide (LASIX) 40 MG tablet Take 1 tablet by mouth daily.    Marland Kitchen losartan (COZAAR) 25 MG tablet Take 1 tablet (25 mg total) by mouth daily. 30 tablet 11  . MILK THISTLE PO Take 1 tablet by mouth daily with supper.    . Omega-3 Fatty Acids (OMEGA 3 PO) Take 1 capsule by mouth daily with supper.    Marland Kitchen OVER THE COUNTER MEDICATION HMF Forte (Probiotic Supplement) daily    . OVER THE COUNTER MEDICATION Take 1 tablet by mouth every evening. Beta-Sitosterol 60MG  daily     . rivaroxaban (XARELTO) 20 MG TABS tablet Take 1 tablet (20 mg total) by mouth daily with supper. 30 tablet 11  . traZODone (DESYREL) 100 MG tablet Take 200 mg by mouth at bedtime.     Marland Kitchen VITAMIN A PO Take 1 tablet by mouth daily.    . vitamin  B-12 (CYANOCOBALAMIN) 100 MCG tablet Take 100 mcg by mouth daily.    . vitamin E 400 UNIT capsule Take 400 Units by mouth daily.      No current facility-administered medications for this visit.     Allergies:   Iodinated diagnostic agents    Social History:  The patient  reports that he quit smoking about 11 months ago. His smoking use included Cigars. He quit after 25.00 years of use. He quit smokeless tobacco use about 19 years ago. His smokeless tobacco use included Chew. He reports that he  drinks alcohol. He reports that he uses drugs, including Cocaine.   Family History:  The patient's family history includes Cancer in his mother; Cerebrovascular Accident in his mother; Depression in his father; Heart disease in his father; Hypertension in his father.    ROS:  Please see the history of present illness.   Otherwise, review of systems are positive for none.   All other systems are reviewed and negative.    PHYSICAL EXAM: VS:  BP 115/76   Pulse 92   Ht 5\' 4"  (1.626 m)   Wt 91.4 kg (201 lb 9.6 oz)   BMI 34.60 kg/m  , BMI Body mass index is 34.6 kg/m. GENERAL:  Well appearing HEENT:  Pupils equal round and reactive, fundi not visualized, oral mucosa unremarkable NECK:  No jugular venous distention, waveform within normal limits, carotid upstroke brisk and symmetric, no bruits, no thyromegaly LYMPHATICS:  No cervical adenopathy LUNGS:  Clear to auscultation bilaterally HEART:  Irregularly irregular.  PMI not displaced or sustained,S1 and S2 within normal limits, no S3, no S4, no clicks, no rubs, no murmurs ABD:  Flat, positive bowel sounds normal in frequency in pitch, no bruits, no rebound, no guarding, no midline pulsatile mass, no hepatomegaly, no splenomegaly EXT:  2 plus pulses throughout, 2+ pitting edema to the upper tibia bilaterally.  R>L, no cyanosis no clubbing SKIN:  No rashes no nodules NEURO:  Cranial nerves II through XII grossly intact, motor grossly intact  throughout PSYCH:  Cognitively intact, oriented to person place and time    EKG:  EKG is ordered today. The ekg ordered today demonstrates atrial fibrillation.  Rate 92 bpm.  RBBB.  ECHO: 10/11/16 - Left ventricle: The cavity size was normal. There was mild focal basal hypertrophy of the septum. Systolic function was moderately reduced. The estimated ejection fraction was in the range of 35% to 40%. Diffuse hypokinesis. - Aortic valve: Trileaflet; mildly thickened, mildly calcified leaflets. - Left atrium: The atrium was moderately dilated.  TEE/DCCV: 10/26 - Left ventricle: Systolic function was mildly to moderately reduced. The estimated ejection fraction was in the range of 40% to 45%. Wall motion was normal; there were no regional wall motion abnormalities. - Aortic valve: There was trivial regurgitation. - Left atrium: The atrium was mildly to moderately dilated. No evidence of thrombus in the atrial cavity or appendage. - Right atrium: The atrium was mildly dilated. No evidence of thrombus in the atrial cavity or appendage. - Tricuspid valve: No evidence of vegetation. - Pulmonic valve: No evidence of vegetation.  Recent Labs: 10/10/2016: ALT 26; BUN 5; Potassium 4.0; Sodium 139; TSH 1.170 10/11/2016: B Natriuretic Peptide 373.8 10/16/2016: Creatinine, Ser 0.70 11/03/2016: Hemoglobin 9.9; Platelets 214    Lipid Panel    Component Value Date/Time   CHOL 163 10/11/2016 0228   TRIG 76 10/11/2016 0228   HDL 49 10/11/2016 0228   CHOLHDL 3.3 10/11/2016 0228   VLDL 15 10/11/2016 0228   LDLCALC 99 10/11/2016 0228      Wt Readings from Last 3 Encounters:  11/14/16 91.4 kg (201 lb 9.6 oz)  11/03/16 89.4 kg (197 lb 1.9 oz)  10/26/16 90.7 kg (200 lb)      ASSESSMENT AND PLAN:  # Chronic systolic and diastolic heart failure: Etiology either due to alcohol abuse or atrial fibrillation with RVR.  LVEF 35-40%.  He was congratulated on abstinence from  alcohol.  Adrian Ramos appears to be volume overloaded.  Agree with starting lasix 40mg  daily.  Continue carvedilol and losatan.  We will repeat an echo in 2 months.  # Lower extremity edema: Adrian Ramos  Has asymmetric lower extremity edema with much more edema in the right than he left leg. We will obtain a lower extremiity ultrasound to ensure that he does not have a DVT.  # Paroxysmal atrial fibrillation:  Adrian Ramos is back in atrial fibrillation today, though the rates are well-controlled.  Continue amiodarone.  We will reduce to 200 mg daily at his follow up appointment.  He just refilled Xarelto.  Eliquis has a slightly better GI bleeding profile and his h/h continues to drip.  When he finishes this prescription, we will switch to Eliquis 5mg  bid.   # Hypertension:  BP controlled on carvedilol and losartan.     Current medicines are reviewed at length with the patient today.  The patient does not have concerns regarding medicines.  The following changes have been made:  no change  Labs/ tests ordered today include:   Orders Placed This Encounter  Procedures  . EKG 12-Lead     Disposition:   FU with APP in 2 weeks.  Kayela Humphres C. Duke Salviaandolph, MD, [facility] in 2 months.     This note was written with the assistance of speech recognition software.  Please excuse any transcriptional errors.  Signed, Kiah Keay C. Duke Salviaandolph, MD, [facility]  11/14/2016 1:02 PM    [facility]

## 2016-11-14 ENCOUNTER — Ambulatory Visit (INDEPENDENT_AMBULATORY_CARE_PROVIDER_SITE_OTHER): Payer: Medicare Other | Admitting: Cardiovascular Disease

## 2016-11-14 ENCOUNTER — Encounter: Payer: Self-pay | Admitting: Cardiovascular Disease

## 2016-11-14 ENCOUNTER — Ambulatory Visit (HOSPITAL_COMMUNITY)
Admission: RE | Admit: 2016-11-14 | Discharge: 2016-11-14 | Disposition: A | Discharge status: Home / Self Care | Payer: Medicare Other | Source: Ambulatory Visit | Attending: Cardiovascular Disease | Admitting: Cardiovascular Disease

## 2016-11-14 VITALS — BP 115/76 | HR 92 | Ht 64.0 in | Wt 201.6 lb

## 2016-11-14 DIAGNOSIS — I5042 Chronic combined systolic (congestive) and diastolic (congestive) heart failure: Secondary | ICD-10-CM | POA: Diagnosis not present

## 2016-11-14 DIAGNOSIS — I110 Hypertensive heart disease with heart failure: Secondary | ICD-10-CM | POA: Diagnosis not present

## 2016-11-14 DIAGNOSIS — I10 Essential (primary) hypertension: Secondary | ICD-10-CM | POA: Diagnosis not present

## 2016-11-14 DIAGNOSIS — I480 Paroxysmal atrial fibrillation: Secondary | ICD-10-CM | POA: Diagnosis not present

## 2016-11-14 DIAGNOSIS — R600 Localized edema: Secondary | ICD-10-CM | POA: Diagnosis not present

## 2016-11-14 DIAGNOSIS — Z87891 Personal history of nicotine dependence: Secondary | ICD-10-CM | POA: Insufficient documentation

## 2016-11-14 NOTE — Patient Instructions (Signed)
Medication Instructions:  Your physician recommends that you continue on your current medications as directed. Please refer to the Current Medication list given to you today.  Labwork: none  Testing/Procedures: Your physician has requested that you have a lower or upper extremity venous duplex. This test is an ultrasound of the veins in the legs or arms. It looks at venous blood flow that carries blood from the heart to the legs or arms. Allow one hour for a Lower Venous exam. Allow thirty minutes for an Upper Venous exam. There are no restrictions or special instructions.  Follow-Up: Your physician recommends that you schedule a follow-up appointment in: 2 weeks with PA/NP  Your physician recommends that you schedule a follow-up appointment in: 2 months with Dr Duke Salviaandolph  If you need a refill on your cardiac medications before your next appointment, please call your pharmacy.

## 2016-11-15 ENCOUNTER — Encounter: Payer: Self-pay | Admitting: Gastroenterology

## 2016-11-15 ENCOUNTER — Encounter (HOSPITAL_COMMUNITY): Payer: Self-pay

## 2016-11-15 NOTE — Progress Notes (Signed)
Bilateral lower extremity venous duplex on 11/14/16 is negative for DVT. There is a calcified valve in the right popliteal vein. Preliminary results given to Dr. Duke Salviaandolph.

## 2016-11-16 ENCOUNTER — Encounter (HOSPITAL_COMMUNITY): Admission: RE | Payer: Self-pay | Source: Ambulatory Visit

## 2016-11-16 ENCOUNTER — Ambulatory Visit (HOSPITAL_COMMUNITY): Admission: RE | Admit: 2016-11-16 | Payer: Medicare Other | Source: Ambulatory Visit | Admitting: General Surgery

## 2016-11-16 ENCOUNTER — Telehealth: Payer: Self-pay | Admitting: *Deleted

## 2016-11-16 SURGERY — REPAIR, HERNIA, INGUINAL, LAPAROSCOPIC
Anesthesia: General | Laterality: Right

## 2016-11-16 NOTE — Telephone Encounter (Signed)
Spoke with patient regarding recent lower extremity doppler, results reviewed Patient stated that his Hgb 3 weeks ago was 9.9 and on Monday to 8.6.  PCP increased iron to 3 times and will recheck Hgb next week. PCP is working on trying to get GI appointment sooner per patient.  Discussed with Dr Duke Salviaandolph and she would like patient to hold Xarelto and start ASA 81 mg daily until after he sees GI. Advised patient and he will follow up with PCP regarding moving up his appointment

## 2016-11-27 ENCOUNTER — Encounter: Payer: Self-pay | Admitting: Physician Assistant

## 2016-11-27 ENCOUNTER — Ambulatory Visit (INDEPENDENT_AMBULATORY_CARE_PROVIDER_SITE_OTHER): Payer: Medicare Other | Admitting: Physician Assistant

## 2016-11-27 VITALS — BP 115/58 | HR 41 | Ht 64.0 in | Wt 191.0 lb

## 2016-11-27 DIAGNOSIS — K922 Gastrointestinal hemorrhage, unspecified: Secondary | ICD-10-CM

## 2016-11-27 DIAGNOSIS — D62 Acute posthemorrhagic anemia: Secondary | ICD-10-CM

## 2016-11-27 DIAGNOSIS — I5042 Chronic combined systolic (congestive) and diastolic (congestive) heart failure: Secondary | ICD-10-CM | POA: Diagnosis not present

## 2016-11-27 DIAGNOSIS — R001 Bradycardia, unspecified: Secondary | ICD-10-CM

## 2016-11-27 DIAGNOSIS — I2510 Atherosclerotic heart disease of native coronary artery without angina pectoris: Secondary | ICD-10-CM

## 2016-11-27 DIAGNOSIS — R600 Localized edema: Secondary | ICD-10-CM | POA: Diagnosis not present

## 2016-11-27 DIAGNOSIS — Z79899 Other long term (current) drug therapy: Secondary | ICD-10-CM

## 2016-11-27 DIAGNOSIS — I480 Paroxysmal atrial fibrillation: Secondary | ICD-10-CM

## 2016-11-27 LAB — BASIC METABOLIC PANEL
BUN: 18 mg/dL (ref 7–25)
CHLORIDE: 103 mmol/L (ref 98–110)
CO2: 30 mmol/L (ref 20–31)
Calcium: 8.7 mg/dL (ref 8.6–10.3)
Creat: 0.59 mg/dL — ABNORMAL LOW (ref 0.70–1.18)
GLUCOSE: 95 mg/dL (ref 65–99)
POTASSIUM: 4.2 mmol/L (ref 3.5–5.3)
SODIUM: 140 mmol/L (ref 135–146)

## 2016-11-27 MED ORDER — AMIODARONE HCL 200 MG PO TABS: 200.0000 mg | ORAL_TABLET | Freq: Every day | ORAL | 1 refills | Status: DC

## 2016-11-27 NOTE — Progress Notes (Signed)
Cardiology Office Note    Date:  11/27/2016   ID:  Adrian Ramos, DOB 23-Jun-1946, MRN [id number]  PCP:  Adrian Clan, MD  Cardiologist:  Dr. Duke Ramos   Chief Complaint  Patient presents with  . Follow-up    seen for Dr. Duke Ramos, PAF and GI bleed    History of Present Illness:  Adrian Ramos is a 70 y.o. male with PMH of chronic systolic and diastolic HF, PAF and EtOH abuse. He was originally seen by Dr. Mayford Ramos on 10/10/2016 for cardiac clearance prior to right inguinal hernia repair. During the office visit, he was noted to be atrial fibrillation with RVR and was symptomatic with dyspnea. He was directly admitted to [facility] and was placed on IV Cardizem for rate control. Unfortunately that did not control his heart rate, so metoprolol was added as well. Echocardiogram was performed that showed his EF was decreased to 35-40%. It was felt his LV dysfunction was the result of rapid A. fib versus EtOH. He was placed on Xarelto for anticoagulation. He had a TEE/DCCV on 10/26 but was unsuccessful. He immediately reverted back to atrial fibrillation. The decision was made to start him on amiodarone. As he was being loaded on amiodarone, he spontaneously converted back to sinus rhythm. He did have some bradycardia, so metoprolol was changed to Coreg. Losartan was added for BP control.   Given newly diagnosed atrial fibrillation, he underwent a Myoview to rule out underlying CAD. It was abnormal with large fixed defect in the inferoseptal region, no stress-induced ischemia was noted. This was interpreted as high risk study based on the large inferoseptal fixed defect and LV dilatation. Subsequently, he had a cardiac CT for further evaluation, this suggested moderate CAD in the LAD and the mid LAD and RCA, medical therapy was recommended. He did have a pulmonary nodule on CT, this can be followed by a repeat CT 1 year.  Unfortunately on follow-up on 10/26/2016, EKG shows he has reverted  back into atrial fibrillation. His amiodarone was increased to 400 mg twice a day. Also his CBC showed hemoglobin dropped from 14 down to 11, Protonix was added for GI protection. Anemia panel was also checked, ferritin was normal, however iron was low. He was started on iron supplement.Marland Kitchen He was also Hemoccult positive 3. He was reverted back to sinus rhythm on follow-up 11/03/16. He was recently seen by Dr. Duke Ramos on 11/28, at which time he complained of lower extremity edema. He attributed to the Protonix that was started. Also noted diarrhea with hematochezia after starting Protonix and improved after he stopped that medication. He was started on Lasix. Lower extremity venous Doppler was obtained at that was negative for DVT. His amiodarone was cut back to 200 mg daily. He was also switched to eliquis 5 mg twice a day for its better GI lead profile. Patient was referred to gastroenterology for further evaluation.  Since today for cardiology office visit. He denies any chest discomfort, shortness breath, lower extremity edema, orthopnea or paroxysmal nocturnal dyspnea. However since starting the Lasix, his lower extremity edema has significantly improved. He is weight has also decreased down to 188 pounds from a max of 198 pounds. He did not have any repeat basic metabolic panel to check the kidney function after starting Lasix. Based on recent lab work, it appears on 11/17, his hemoglobin was 9.9. It dipped down to 8.6 on 11/27. Last blood work was 12/7 which shows his hemoglobin has improved to 9.6.  Unfortunately he is GI in patient evaluation his very much delayed until mid January. We have tried to arrange for earlier visit, he is fine was seen in GI PA instead if he can be seen early. Also noted today that his EKG shows sinus bradycardia, however his heart rate is 41 while he is awake and alert and oriented. He is largely asymptomatic with this degree of bradycardia. He denies any recent presyncope or  dizziness or severe fatigue. Given the fact that he has been on amiodarone for a while, I think he is loaded by this point, I will decrease the amiodarone down to 200 mg daily. I will continue the carvedilol at the current dose. He will return in 2 weeks for nursing visit with repeat EKG to see his heart rate. He has stopped his Xarelto since December 1, he continued to have melena on daily basis despite hemoglobin is increasing, I have instructed him to continue on aspirin and hold Xarelto until further GI workup. Stand to slightly higher chance of stroke with aspirin compared to Xarelto. After GI workup, we will likely switch the patient to eliquis 5 mg twice a day due to better GI bleed profile. He was intolerant to Protonix due to diarrhea, I have recommended him to try either Prilosec or Nexium to see if he is able to tolerate them.  According to the patient, he has not had a large amount of alcohol since September.    Past Medical History:  Diagnosis Date  . A-fib (HCC)   . Acute medial meniscal injury of knee LEFT  . Alcohol abuse, in remission   . Anxiety   . Back pain    right side; occurs more with movement  . BPH (benign prostatic hypertrophy)   . CHF (congestive heart failure) (HCC)    EF-40-45 %  . Chronic midline back pain    "right side; last 6 wks" (10/10/2016)  . Complication of anesthesia 2007   POST-OP URINARY RETENTION  . Dyspnea    on exertion;   . History of meniscal tear    left  . History of urinary retention 2007   POST OP  (BPH W/ BLADDER OUTLET OBSTRUCTION)  . Hypertension   . IDA (iron deficiency anemia)   . Insomnia   . Iritis   . Pulmonary nodule, left 10/16/2016  . SOB (shortness of breath) on exertion     Past Surgical History:  Procedure Laterality Date  . BENIGN PENILE GROWTH REMOVED  1985  . CARDIAC CATHETERIZATION  2000   NORMAL  (FALSE POSITIVE STRESS TEST)    . CARDIOVERSION N/A 10/12/2016   Procedure: CARDIOVERSION;  Surgeon: Adrian Reichertraci R  Turner, MD;  Location: [facility];  Service: Cardiovascular;  Laterality: N/A;  . CATARACT EXTRACTION W/ INTRAOCULAR LENS  IMPLANT, BILATERAL Bilateral 06/2015-07/2015  . INGUINAL HERNIA REPAIR Right 07-19-2006  . KNEE ARTHROSCOPY  03/13/2012   Procedure: ARTHROSCOPY KNEE;  Surgeon: Loanne DrillingFrank V Aluisio, MD;  [facility];  Service: Orthopedics;  Laterality: Left;  WITH DEBRIDEMENT medial meniscus  . REPAIR RECURRENT RIGHT INGUINAL HERNIA  12-03-2006  . TEE WITHOUT CARDIOVERSION N/A 10/12/2016   Procedure: TRANSESOPHAGEAL ECHOCARDIOGRAM (TEE);  Surgeon: Adrian Reichertraci R Turner, MD;  [facility];  Service: Cardiovascular;  Laterality: N/A;    Current Medications: Outpatient Medications Prior to Visit  Medication Sig Dispense Refill  . Ascorbic Acid (VITAMIN C) 1000 MG tablet Take 1,000 mg by mouth daily.    Marland Kitchen. aspirin EC 81 MG tablet  Take 81 mg by mouth daily.    Marland Kitchen atorvastatin (LIPITOR) 40 MG tablet Take 1 tablet (40 mg total) by mouth daily at 6 PM. 30 tablet 11  . carvedilol (COREG) 6.25 MG tablet Take 1 tablet (6.25 mg total) by mouth 2 (two) times daily. 60 tablet 11  . chlorhexidine (PERIDEX) 0.12 % solution 10 mLs by Mouth Rinse route 2 (two) times daily.   3  . Cholecalciferol (VITAMIN D3 PO) Take 1 tablet by mouth daily with supper.    . Clotrimazole 1 % OINT Apply topically.    . Coenzyme Q10 (COQ-10 PO) Take 1 capsule by mouth daily with supper.    . diazepam (VALIUM) 10 MG tablet Take 10-20 mg by mouth 4 (four) times daily as needed for anxiety.     . finasteride (PROSCAR) 5 MG tablet Take 5 mg by mouth every evening.    . furosemide (LASIX) 40 MG tablet Take 1 tablet by mouth daily.    Marland Kitchen losartan (COZAAR) 25 MG tablet Take 1 tablet (25 mg total) by mouth daily. 30 tablet 11  . MILK THISTLE PO Take 1 tablet by mouth daily with supper.    . Omega-3 Fatty Acids (OMEGA 3 PO) Take 1 capsule by mouth daily with supper.    Marland Kitchen OVER THE COUNTER MEDICATION HMF Forte  (Probiotic Supplement) daily    . OVER THE COUNTER MEDICATION Take 1 tablet by mouth every evening. Beta-Sitosterol 60MG  daily     . traZODone (DESYREL) 100 MG tablet Take 200 mg by mouth at bedtime.     Marland Kitchen VITAMIN A PO Take 1 tablet by mouth daily.    . vitamin B-12 (CYANOCOBALAMIN) 100 MCG tablet Take 100 mcg by mouth daily.    . vitamin E 400 UNIT capsule Take 400 Units by mouth daily.     Marland Kitchen amiodarone (PACERONE) 200 MG tablet Take 2 tablets (400 mg total) by mouth daily. 60 tablet 1  . Ferrous Sulfate (IRON) 325 (65 Fe) MG TABS Take 1 tablet by mouth daily. 30 each 0   No facility-administered medications prior to visit.      Allergies:   Iodinated diagnostic agents   Social History   Social History  . Marital status: Single    Spouse name: N/A  . Number of children: 0  . Years of education: N/A   Social History Main Topics  . Smoking status: Former Smoker    Years: 25.00    Types: Cigars    Quit date: 12/07/2015  . Smokeless tobacco: Former Neurosurgeon    Types: Chew    Quit date: 12/18/1996     Comment: 2 CIGARS DAILY  . Alcohol use Yes     Comment: 10/10/2016 "heavy drinker till aprox 1 month ago"  . Drug use:     Types: Cocaine     Comment: 10/10/2016 "just for 1 yr in 1978"  . Sexual activity: Not Currently   Other Topics Concern  . None   Social History Narrative  . None     Family History:  The patient's family history includes Anxiety disorder in his father; Cerebrovascular Accident in his mother; Depression in his father; Heart disease in his father; Hodgkin's lymphoma in his mother; Hypertension in his father.   ROS:   Please see the history of present illness.    ROS All other systems reviewed and are negative.   PHYSICAL EXAM:   VS:  BP (!) 115/58   Pulse (!) 41   Ht  5\' 4"  (1.626 m)   Wt 191 lb (86.6 kg)   BMI 32.79 kg/m    GEN: Well nourished, well developed, in no acute distress  HEENT: normal  Neck: no JVD, carotid bruits, or masses Cardiac: RRR;  no murmurs, rubs, or gallops,no edema  Respiratory:  clear to auscultation bilaterally, normal work of breathing GI: soft, nontender, nondistended, + BS MS: no deformity or atrophy  Skin: warm and dry, no rash Neuro:  Alert and Oriented x 3, Strength and sensation are intact Psych: euthymic mood, full affect  Wt Readings from Last 3 Encounters:  11/27/16 191 lb (86.6 kg)  11/14/16 201 lb 9.6 oz (91.4 kg)  11/03/16 197 lb 1.9 oz (89.4 kg)      Studies/Labs Reviewed:   EKG:  EKG is ordered today.  The ekg ordered today demonstrates Severe sinus bradycardia with heart rate 41  Recent Labs: 10/10/2016: ALT 26; BUN 5; Potassium 4.0; Sodium 139; TSH 1.170 10/11/2016: B Natriuretic Peptide 373.8 10/16/2016: Creatinine, Ser 0.70 11/03/2016: Hemoglobin 9.9; Platelets 214   Lipid Panel    Component Value Date/Time   CHOL 163 10/11/2016 0228   TRIG 76 10/11/2016 0228   HDL 49 10/11/2016 0228   CHOLHDL 3.3 10/11/2016 0228   VLDL 15 10/11/2016 0228   LDLCALC 99 10/11/2016 0228    Additional studies/ records that were reviewed today include:   Echo 10/11/2016 LV EF: 35% -   40%  - Left ventricle: The cavity size was normal. There was mild focal   basal hypertrophy of the septum. Systolic function was moderately   reduced. The estimated ejection fraction was in the range of 35%   to 40%. Diffuse hypokinesis. - Aortic valve: Trileaflet; mildly thickened, mildly calcified   leaflets. - Left atrium: The atrium was moderately dilated.    TEE 10/12/2016 LV EF: 40% -   45%  - Left ventricle: Systolic function was mildly to moderately   reduced. The estimated ejection fraction was in the range of 40%   to 45%. Wall motion was normal; there were no regional wall   motion abnormalities. - Aortic valve: There was trivial regurgitation. - Left atrium: The atrium was mildly to moderately dilated. No   evidence of thrombus in the atrial cavity or appendage. - Right atrium: The  atrium was mildly dilated. No evidence of   thrombus in the atrial cavity or appendage. - Tricuspid valve: No evidence of vegetation. - Pulmonic valve: No evidence of vegetation.     Myoview 10/15/2016 IMPRESSION: 1. Large fixed defect in the infero septal region. No stress-induced ischemia.  2. Global hypokinesis.  3. Left ventricular ejection fraction 36%  4. Non invasive risk stratification*: High risk, based on the large inferoseptal fixed defect and LV dilatation.     Coronary CT 10/16/2016 IMPRESSION: 1. Coronary calcium score of 97. This was 68 percentile for age and sex matched control.  2. Normal coronary origin with right dominance.  3. Moderate CAD in the LAD and mild CAD in RCA. An aggressive risk factor modification is recommended.  4. Normal size pulmonary artery.    LE venous doppler 11/14/2016 Technically challenging study. No evidence of lower extremity deep or superficial venous thrombus or incompetence, bilaterally. Calcified valve in the right popliteal vein.   ASSESSMENT:    1. Lower extremity edema   2. Chronic combined systolic and diastolic heart failure (HCC)   3. Coronary artery disease involving native coronary artery of native heart without angina pectoris   4.  Acute posthemorrhagic anemia   5. Gastrointestinal hemorrhage, unspecified gastrointestinal hemorrhage type   6. Medication management   7. PAF (paroxysmal atrial fibrillation) (HCC)   8. Bradycardia      PLAN:  In order of problems listed above:  1. Chronic combined systolic and diastolic heart failure/LE edema - Euvolemic based on physical exam today. No further sign of lower extremity edema. He has lost roughly 10 pounds based on home weight, today's home weight is 188.6 pounds.  - I will continue on the current diuretic today, he has not had any basic metabolic panel since starting the diuretic to recheck kidney function, I will obtain a basic metabolic panel.  Note his last creatinine on 11/13/2016 was 0.52.  2. Nonobstructive CAD by coronary CT: Abnormal Myoview followed by coronary CT that showed nonobstructive disease. Continue medical therapy.  3. PAF  - This patients CHA2DS2-VASc Score and unadjusted Ischemic Stroke Rate (% per year) is equal to 3.2 % stroke rate/year from a score of 3  Above score calculated as 1 point each if present [CHF, HTN, DM, Vascular=MI/PAD/Aortic Plaque, Age if 65-74, or Male] Above score calculated as 2 points each if present [Age > 75, or Stroke/TIA/TE]  - Recent recurrent paroxysmal atrial fibrillation, amiodarone was increased. Currently on 400mg  a day of amiodarone, his heart rate was 41 today. I think he has completed the loading of amiodarone, I will decrease amiodarone to 200 mg daily instead.  - He was on Xarelto, and this was placed on hold and the switch to aspirin on December 1, once he has GI workup, we will likely start on eliquis 5 mg twice a day afterward. He understand the current increased risk of stroke on aspirin, and also understand with switching temporarily to aspirin due to increased risk of bleeding at this time.  - He will return for nursing visit with repeat EKG in 2 weeks to recheck his heart rate.  4. GI bleed with anemia: hemoccult positive x 3 in Nov 2017, we have attempted to arrange an earlier GI visit since he had 3 positive Hemoccult in November, his hemoglobin reached a nadir of 8.6 on 11/27 before trending back up. Will continue to hold NOAC prior to GI workup. Likely will consider eliquis 5 mg twice a day afterward use of Xarelto due to better GI bleed profile. He is intolerant to Protonix, I have recommended him to try Prilosec or Nexium for GI protection.    Medication Adjustments/Labs and Tests Ordered: Current medicines are reviewed at length with the patient today.  Concerns regarding medicines are outlined above.  Medication changes, Labs and Tests ordered today are listed in  the Patient Instructions below. Patient Instructions  MEDICATION CHANGES:  HOLD XARELTO FOR NOW, CONTINUE ASPIRIN UNTIL GI WORK UP.  DECREASE AMIODARONE TO 200 MG ONE TABLET DAILY.   LABS:  PLEASE HAVE BMP - DONE TODAY.   Your physician recommends that you schedule a follow-up appointment in 2 WEEKS ON NURSE'S SCHEDULE- BLOOD PRESSURE AND EKG- ( DAY WHEN Adrian Ramos IS IN THE OFFICE)   GI APPOINTMENT RESCHEDULE FOR DC 18 ,2017 WITH Adrian Ramos.   [facility] [address]1,2018     Signed, Adrian Ramos, [place]  11/27/2016 10:22 AM    [facility] [address], [place], [place]  [address] Phone: [phone]; Fax: [phone]

## 2016-11-27 NOTE — Patient Instructions (Addendum)
MEDICATION CHANGES:  HOLD XARELTO FOR NOW, CONTINUE ASPIRIN UNTIL GI WORK UP.  DECREASE AMIODARONE TO 200 MG ONE TABLET DAILY.   LABS:  PLEASE HAVE BMP - DONE TODAY.   Your physician recommends that you schedule a follow-up appointment in 2 WEEKS ON NURSE'S SCHEDULE- BLOOD PRESSURE AND EKG- ( DAY WHEN MENG IS IN THE OFFICE)   GI APPOINTMENT RESCHEDULE FOR DC 18 ,2017 WITH JENNIFER.   KEEP APPOINTMENT WITH DR [place] IN FEB [id number]

## 2016-12-04 ENCOUNTER — Encounter: Payer: Self-pay | Admitting: Physician Assistant

## 2016-12-04 ENCOUNTER — Ambulatory Visit (INDEPENDENT_AMBULATORY_CARE_PROVIDER_SITE_OTHER): Payer: Medicare Other | Admitting: Physician Assistant

## 2016-12-04 VITALS — BP 80/50 | HR 60 | Ht 64.0 in | Wt 185.0 lb

## 2016-12-04 DIAGNOSIS — K921 Melena: Secondary | ICD-10-CM

## 2016-12-04 DIAGNOSIS — R195 Other fecal abnormalities: Secondary | ICD-10-CM | POA: Diagnosis not present

## 2016-12-04 DIAGNOSIS — D509 Iron deficiency anemia, unspecified: Secondary | ICD-10-CM

## 2016-12-04 MED ORDER — RANITIDINE HCL 150 MG PO TABS: 150.0000 mg | ORAL_TABLET | Freq: Two times a day (BID) | ORAL | 3 refills | Status: DC

## 2016-12-04 MED ORDER — NA SULFATE-K SULFATE-MG SULF 17.5-3.13-1.6 GM/177ML PO SOLN: 1.0000 | ORAL | 0 refills | Status: AC

## 2016-12-04 NOTE — Progress Notes (Signed)
Chief Complaint: IDA, Hemoccult positive stool  HPI:  Mr. Adrian Ramos is a 70 -year-old Caucasian male with a past medical history of A. fib, CHF with his last EF measured on 10/11/16 35-40%, dyspnea and shortness of breath, who was referred to me by Martha ClanShaw, William, MD for a complaint of iron deficiency anemia and Hemoccult-positive stools.   Today, the patient presents to clinic and tells me that he is typically maintained on Xarelto, but this was stopped on 11/17/16 by his primary care physician after he presented with what sounded like melenic stools. The patient tells me that he was recently discharged from the hospital on 10/16/16 and during his stay in the hospital for hypertension and A. fib he was found to be anemic. He explains that he then went to his primary care provider in mid November/early December and complained of melenic stool that had started 3-4 days after the day of his discharge. He described this as black and sticky and he would have at least 1-2 of these a day. He notes that over the past couple of weeks this has changed to just a black color and has lost its sticky appearance. He was started on iron by his primary care provider after the finding of iron deficiency anemia. Patient does tell me that he was placed on a PPI but the day after starting this he started with urgent diarrhea which he could not control. He stopped this medication this past Saturday and has had no further diarrhea but does not wish to be on this anymore. Apparently he tried to several PPIs. Omeprazole is one of them.   He does describe a generalized abdominal pain, but this is only after he started with diarrhea and he feels this is left over from these episodes this past weekend.   Patient also tells me he becomes short of breath with exertion.   Patient denies previous EGD or colonoscopy.   Patient denies fever, chills, bright red blood in his stool, change in diet, weight loss, fatigue, anorexia, nausea,  vomiting, heartburn, reflux or epigastric abdominal pain.    Past Medical History:  Diagnosis Date  . A-fib (HCC)   . Acute medial meniscal injury of knee LEFT  . Alcohol abuse, in remission   . Anxiety   . Back pain    right side; occurs more with movement  . BPH (benign prostatic hypertrophy)   . CHF (congestive heart failure) (HCC)    EF-40-45 %  . Chronic midline back pain    "right side; last 6 wks" (10/10/2016)  . Complication of anesthesia 2007   POST-OP URINARY RETENTION  . Dyspnea    on exertion;   . History of meniscal tear    left  . History of urinary retention 2007   POST OP  (BPH W/ BLADDER OUTLET OBSTRUCTION)  . Hypertension   . IDA (iron deficiency anemia)   . Insomnia   . Iritis   . Pulmonary nodule, left 10/16/2016  . SOB (shortness of breath) on exertion     Past Surgical History:  Procedure Laterality Date  . BENIGN PENILE GROWTH REMOVED  1985  . CARDIAC CATHETERIZATION  2000   NORMAL  (FALSE POSITIVE STRESS TEST)    . CARDIOVERSION N/A 10/12/2016   Procedure: CARDIOVERSION;  Surgeon: Quintella Reichertraci R Turner, MD;  Location: [facility];  Service: Cardiovascular;  Laterality: N/A;  . CATARACT EXTRACTION W/ INTRAOCULAR LENS  IMPLANT, BILATERAL Bilateral 06/2015-07/2015  . INGUINAL HERNIA REPAIR Right 07-19-2006  .  KNEE ARTHROSCOPY  03/13/2012   Procedure: ARTHROSCOPY KNEE;  Surgeon: Loanne Drilling, MD;  Location: [facility];  Service: Orthopedics;  Laterality: Left;  WITH DEBRIDEMENT medial meniscus  . REPAIR RECURRENT RIGHT INGUINAL HERNIA  12-03-2006  . TEE WITHOUT CARDIOVERSION N/A 10/12/2016   Procedure: TRANSESOPHAGEAL ECHOCARDIOGRAM (TEE);  Surgeon: Quintella Reichert, MD;  Location[facility];  Service: Cardiovascular;  Laterality: N/A;    Current Outpatient Prescriptions  Medication Sig Dispense Refill  . amiodarone (PACERONE) 200 MG tablet Take 1 tablet (200 mg total) by mouth daily. 60 tablet 1  . Ascorbic Acid (VITAMIN C) 1000 MG  tablet Take 1,000 mg by mouth daily.    Marland Kitchen aspirin EC 81 MG tablet Take 81 mg by mouth daily.    Marland Kitchen atorvastatin (LIPITOR) 40 MG tablet Take 1 tablet (40 mg total) by mouth daily at 6 PM. 30 tablet 11  . carvedilol (COREG) 6.25 MG tablet Take 1 tablet (6.25 mg total) by mouth 2 (two) times daily. 60 tablet 11  . chlorhexidine (PERIDEX) 0.12 % solution 10 mLs by Mouth Rinse route 2 (two) times daily.   3  . Cholecalciferol (VITAMIN D3 PO) Take 1 tablet by mouth daily with supper.    . Clotrimazole 1 % OINT Apply topically.    . Coenzyme Q10 (COQ-10 PO) Take 1 capsule by mouth daily with supper.    . diazepam (VALIUM) 10 MG tablet Take 10-20 mg by mouth 4 (four) times daily as needed for anxiety.     Marland Kitchen FERREX 150 150 MG capsule Take 150 mg by mouth daily.  6  . finasteride (PROSCAR) 5 MG tablet Take 5 mg by mouth every evening.    . furosemide (LASIX) 40 MG tablet Take 1 tablet by mouth daily.    Marland Kitchen losartan (COZAAR) 25 MG tablet Take 1 tablet (25 mg total) by mouth daily. 30 tablet 11  . MILK THISTLE PO Take 1 tablet by mouth daily with supper.    . Omega-3 Fatty Acids (OMEGA 3 PO) Take 1 capsule by mouth daily with supper.    Marland Kitchen OVER THE COUNTER MEDICATION HMF Forte (Probiotic Supplement) daily    . OVER THE COUNTER MEDICATION Take 1 tablet by mouth every evening. Beta-Sitosterol 60MG  daily     . traZODone (DESYREL) 100 MG tablet Take 200 mg by mouth at bedtime.     Marland Kitchen VITAMIN A PO Take 1 tablet by mouth daily.    . vitamin B-12 (CYANOCOBALAMIN) 100 MCG tablet Take 100 mcg by mouth daily.    . vitamin E 400 UNIT capsule Take 400 Units by mouth daily.     Carlena Hurl 20 MG TABS tablet Take 1 tablet by mouth daily with supper.  11   No current facility-administered medications for this visit.     Allergies as of 12/04/2016 - Review Complete 11/27/2016  Allergen Reaction Noted  . Iodinated diagnostic agents Hives and Other (See Comments) 08/11/2016    Family History  Problem Relation Age of  Onset  . Cerebrovascular Accident Mother   . Hodgkin's lymphoma Mother   . Depression Father   . Heart disease Father   . Hypertension Father   . Anxiety disorder Father     Social History   Social History  . Marital status: Single    Spouse name: N/A  . Number of children: 0  . Years of education: N/A   Occupational History  . Not on file.   Social History Main Topics  .  Smoking status: Former Smoker    Years: 25.00    Types: Cigars    Quit date: 12/07/2015  . Smokeless tobacco: Former NeurosurgeonUser    Types: Chew    Quit date: 12/18/1996     Comment: 2 CIGARS DAILY  . Alcohol use Yes     Comment: 10/10/2016 "heavy drinker till aprox 1 month ago"  . Drug use:     Types: Cocaine     Comment: 10/10/2016 "just for 1 yr in 1978"  . Sexual activity: Not Currently   Other Topics Concern  . Not on file   Social History Narrative  . No narrative on file    Review of Systems:     Constitutional: No weight loss, fever or chills HEENT: Eyes: No change in vision               Ears, Nose, Throat:  No change in hearing Skin: No rash  Cardiovascular: No chest pain Respiratory: Positive for DOE  Gastrointestinal: See HPI and otherwise negative Neurological: No headache, dizziness or syncope Musculoskeletal: Positive for chronic back pain Hematologic: See history of present illness and no bruising Psychiatric: Positive for anxiety and depression   Physical Exam:  Vital signs: BP (!) 80/50   Pulse 60   Ht 5\' 4"  (1.626 m)   Wt 185 lb (83.9 kg)   BMI 31.76 kg/m    Constitutional:   Pleasant overweight Caucasian male appears to be in NAD, Well developed, Well nourished, alert and cooperative Head:  Normocephalic and atraumatic. Eyes:   PEERL, EOMI. No icterus. Conjunctiva pink. Ears:  Normal auditory acuity. Neck:  Supple Throat: Oral cavity and pharynx without inflammation, swelling or lesion.  Respiratory: Respirations even and unlabored. Lungs clear to auscultation  bilaterally.   No wheezes, crackles, or rhonchi.  Cardiovascular: Normal S1, S2. No MRG. Regular rate and rhythm. No peripheral edema, cyanosis or pallor.  Gastrointestinal:  Soft, nondistended, nontender. No rebound or guarding. Normal bowel sounds. No appreciable masses or hepatomegaly. Rectal:  External exam: Residue of dark stool, some erythema and irritation up the cleft, external hemorrhoid; Internal exam: No mass or tenderness Msk:  Symmetrical without gross deformities. Without edema, no deformity or joint abnormality.  Neurologic:  Alert and  oriented x4;  grossly normal neurologically.  Skin:   Dry and intact without significant lesions or rashes. Psychiatric: Demonstrates good judgement and reason without abnormal affect or behaviors.  MOST RECENT LABS AND IMAGING: CBC    Component Value Date/Time   WBC 6.5 11/03/2016 1056   RBC 3.21 (L) 11/03/2016 1056   HGB 9.9 (L) 11/03/2016 1056   HCT 29.8 (L) 11/03/2016 1056   PLT 214 11/03/2016 1056   MCV 92.8 11/03/2016 1056   MCH 30.8 11/03/2016 1056   MCHC 33.2 11/03/2016 1056   RDW 14.2 11/03/2016 1056   LYMPHSABS 936 11/01/2016 1158   MONOABS 792 11/01/2016 1158   EOSABS 72 11/01/2016 1158   BASOSABS 0 11/01/2016 1158    CMP     Component Value Date/Time   NA 140 11/27/2016 1036   K 4.2 11/27/2016 1036   CL 103 11/27/2016 1036   CO2 30 11/27/2016 1036   GLUCOSE 95 11/27/2016 1036   BUN 18 11/27/2016 1036   CREATININE 0.59 (L) 11/27/2016 1036   CALCIUM 8.7 11/27/2016 1036   PROT 6.3 (L) 10/10/2016 1818   ALBUMIN 3.6 10/10/2016 1818   AST 25 10/10/2016 1818   ALT 26 10/10/2016 1818   ALKPHOS 52 10/10/2016 1818  BILITOT 0.7 10/10/2016 1818   GFRNONAA >60 10/16/2016 0159   GFRAA >60 10/16/2016 0159   Results for Adrian Ramos I (MRN [id number]) as of 12/04/2016 13:00  Ref. Range 11/01/2016 11:58  Iron Latest Ref Range: 50 - 180 ug/dL 25 (L)  Ferritin Latest Ref Range: 20 - 380 ng/mL 46    Ref. Range 10/27/2016  09:00 10/28/2016 09:00 10/29/2016 09:00  Fecal Occult Blood, POC Latest Ref Range: Negative  Positive (A) Positive (A) Positive (A)   Assessment: 1. IDA: Patient with recent labs in the beginning of November Hemoccult-positive stools, low iron and hemoglobin around 9, melena for a couple of weeks, none recently, patient was placed on an iron supplement and taken off of his blood thinner, describes some generalized abdominal pain but this is after diarrhea from a PPI; concern for upper GI source of blood loss 2. Hemoccult-positive stools: See above 3. Melena: See above  Plan: 1. Recommend patient have an EGD and colonoscopy for further evaluation. Discussed risks, benefits, limitations and alternatives and the patient agrees to proceed. He asks to be scheduled with Dr. Adela Lank today. This will occur in the [facility]. After discussion with Dr. Adela Lank patient's last EF is 35-40% and this is safe. 2. Recommend the patient continue his iron supplementation 3. Prescribed Zantac 150 twice a day. Patient tells me he does not want any more PPIs because these cause urgent incontinent diarrhea. 4. Patient to remain off of his Xarelto until further discussion after time of the procedures, this was initially discontinued by his PCP and patient was started on aspirin. 5. Patient to follow in clinic per Dr. Adela Lank to recommendations.  Hyacinth Meeker, PA-C [facility] 12/04/2016, 9:52 AM  Cc: Martha Clan, MD

## 2016-12-04 NOTE — Patient Instructions (Signed)

## 2016-12-04 NOTE — Progress Notes (Signed)
Thanks for clarifying, I just saw it in your note after I asked. Sorry about that

## 2016-12-04 NOTE — Progress Notes (Signed)
Agree with assessment and plan. Has this patient ever had a prior colonoscopy or endoscopy? Will await results of workup. Thanks

## 2016-12-13 ENCOUNTER — Other Ambulatory Visit (INDEPENDENT_AMBULATORY_CARE_PROVIDER_SITE_OTHER): Payer: Medicare Other | Admitting: *Deleted

## 2016-12-13 VITALS — BP 116/50 | HR 62 | Resp 18 | Wt 183.6 lb

## 2016-12-13 DIAGNOSIS — Z79899 Other long term (current) drug therapy: Secondary | ICD-10-CM

## 2016-12-13 DIAGNOSIS — R001 Bradycardia, unspecified: Secondary | ICD-10-CM

## 2016-12-13 NOTE — Progress Notes (Unsigned)
ECG done and Azalee CourseHao Meng spoke with pt

## 2016-12-26 ENCOUNTER — Telehealth: Payer: Self-pay | Admitting: Gastroenterology

## 2016-12-26 ENCOUNTER — Other Ambulatory Visit: Payer: Self-pay

## 2016-12-26 DIAGNOSIS — D509 Iron deficiency anemia, unspecified: Secondary | ICD-10-CM

## 2016-12-26 NOTE — Telephone Encounter (Signed)
I received a fax regarding patient's blood work from Dr. Clelia CroftShaw.  Labs done 12/14/16 - Hgb is 11.0, improved from previous value, on iron.  This patient has iron deficiency anemia with (+) FOBT and report of dark stools.   Raynelle FanningJulie I noticed this patient is not scheduled for EGD / colonoscopy until the end of February. Can you clarify with him if he chose to delay it this long or was nothing else open? He should really have these exams done as soon as possible, we can try fit him in somewhere.   Otherwise recommend repeat CBC in another 2 weeks. Thanks

## 2016-12-26 NOTE — Telephone Encounter (Signed)
Spoke to patient, let him know about his Hgb and will need a repeat in two weeks. Patient will be in town on 2/1 for another MD appointment and will come by our lab at that time.  Looking at your schedule there was nothing available sooner than 2/19, patient states he does not want to move that date as he has arranged for a ride on that day.

## 2016-12-26 NOTE — Telephone Encounter (Signed)
Okay thanks for checking we will see what his repeat CBC shows.

## 2017-01-02 ENCOUNTER — Ambulatory Visit: Payer: Medicare Other | Admitting: Gastroenterology

## 2017-01-18 ENCOUNTER — Encounter: Payer: Self-pay | Admitting: Cardiovascular Disease

## 2017-01-18 ENCOUNTER — Ambulatory Visit (INDEPENDENT_AMBULATORY_CARE_PROVIDER_SITE_OTHER): Payer: Medicare Other | Admitting: Cardiovascular Disease

## 2017-01-18 ENCOUNTER — Other Ambulatory Visit (INDEPENDENT_AMBULATORY_CARE_PROVIDER_SITE_OTHER): Payer: Medicare Other

## 2017-01-18 ENCOUNTER — Other Ambulatory Visit: Payer: Medicare Other

## 2017-01-18 VITALS — BP 127/71 | HR 50 | Ht 63.5 in | Wt 192.8 lb

## 2017-01-18 DIAGNOSIS — I480 Paroxysmal atrial fibrillation: Secondary | ICD-10-CM | POA: Diagnosis not present

## 2017-01-18 DIAGNOSIS — D509 Iron deficiency anemia, unspecified: Secondary | ICD-10-CM

## 2017-01-18 DIAGNOSIS — D729 Disorder of white blood cells, unspecified: Secondary | ICD-10-CM

## 2017-01-18 DIAGNOSIS — I5042 Chronic combined systolic (congestive) and diastolic (congestive) heart failure: Secondary | ICD-10-CM | POA: Diagnosis not present

## 2017-01-18 DIAGNOSIS — I10 Essential (primary) hypertension: Secondary | ICD-10-CM

## 2017-01-18 DIAGNOSIS — I5041 Acute combined systolic (congestive) and diastolic (congestive) heart failure: Secondary | ICD-10-CM

## 2017-01-18 LAB — CBC WITH DIFFERENTIAL/PLATELET
BASOS ABS: 0 10*3/uL (ref 0.0–0.1)
Basophils Relative: 0.7 % (ref 0.0–3.0)
EOS PCT: 1.4 % (ref 0.0–5.0)
Eosinophils Absolute: 0.1 10*3/uL (ref 0.0–0.7)
HEMATOCRIT: 39.8 % (ref 39.0–52.0)
Hemoglobin: 13.5 g/dL (ref 13.0–17.0)
LYMPHS PCT: 25 % (ref 12.0–46.0)
Lymphs Abs: 1.3 10*3/uL (ref 0.7–4.0)
MCHC: 33.9 g/dL (ref 30.0–36.0)
MCV: 91.9 fl (ref 78.0–100.0)
MONOS PCT: 18.6 % — AB (ref 3.0–12.0)
Monocytes Absolute: 1 10*3/uL (ref 0.1–1.0)
Neutro Abs: 2.8 10*3/uL (ref 1.4–7.7)
Neutrophils Relative %: 54.3 % (ref 43.0–77.0)
Platelets: 139 10*3/uL — ABNORMAL LOW (ref 150.0–400.0)
RBC: 4.33 Mil/uL (ref 4.22–5.81)
RDW: 16.5 % — ABNORMAL HIGH (ref 11.5–15.5)
WBC: 5.2 10*3/uL (ref 4.0–10.5)

## 2017-01-18 MED ORDER — AMIODARONE HCL 100 MG PO TABS: 100.0000 mg | ORAL_TABLET | Freq: Every day | ORAL | 5 refills | Status: DC

## 2017-01-18 NOTE — Progress Notes (Signed)
Cardiology Office Note   Date:  01/18/2017   ID:  Adrian Ramos, DOB 11/27/1946, MRN [id number]  PCP:  Martha Clan, MD  Cardiologist:   Chilton Si, MD   Chief Complaint  Patient presents with  . Follow-up    2 months;   . Shortness of Breath    rarely;     History of Present Illness: Adrian Ramos is a 71 y.o. male with chronic systolic and diastolic heart failure, paroxysmal atrial fibrillation, alcohol abuse, who presents for follow up. Adrian Ramos originally saw Dr. Mayford Knife on 10/10/73 surgical clearance prior to inguinal hernia repair. At that appointment he was noted to be in atrial fibrillation with rapid ventricular response. He was also short of breath. He was admitted to [facility]. Echocardiogram that hospitalization revealed LVEF 35-40%. It was unclear whether his heart failure was due to atrial fibrillation with rapid ventricular response or alcohol abuse. He was started on Xarelto and underwent TEE/DCCV on 10/12/16.  This was unsuccessful and he went back into atrial fibrillation. He was subsequently started on amiodarone and spontaneously converted to sinus rhythm.  He had a [place] Myoview that revealed a large, fixed defect in the inferoseptal region. There is no inducible ischemia. He then had a cardiac CT that showed moderate coronary artery disease in the LAD and mild atherosclerosis in the RCA.  He was subsequently noted to be back in atrial fibrillation in clinic on 10/26/16.  His hemoglobin dropped from 14 to 11 and he was started on a PPI.  He saw Boyce Medici, PA-C, on 11/17 and was back in sinus rhythm and doing well.    At his last appointment 10/2016 Adrian Ramos was noted to be volume overloaded.  He was started on lasix 40 mg daily.  He also had lower extremity Dopplers that were negative for DVT.  He saw Azalee Course 11/27/16 and his edema had improved significantly.  He also lost 10 lb.  He has been feeling well and denies chest pain.  He had one  episode of shortness of breath yesterday that occurred when walking.  He denies chest pain, orthopnea or PND.  He also denies palpitations, lightheadedness or dizziness.  His lower extremity edema has improved significantly.  Adrian Ramos has experienced melena but has been intolerant of pantoprazole due to diarrhea.  He was seen by Dr. Adela Lank and was started on ranitidine.  Xarelto was held and he is scheduled for EGD/colonoscopy in February.  His hemoglobin increased to 11 with the addition of iron.  He hasn't noted any recent melena or hematochezia.  In general he has abstained from alcohol.  However, he drank two bottles of champagne over New Years.  Adrian Ramos has struggled with exercise due to chronic back pain.  He also has a left medial meniscus tear.    Past Medical History:  Diagnosis Date  . A-fib (HCC)   . Acute medial meniscal injury of knee LEFT  . Alcohol abuse, in remission   . Anxiety   . Back pain    right side; occurs more with movement  . BPH (benign prostatic hypertrophy)   . CHF (congestive heart failure) (HCC)    EF-40-45 %  . Chronic midline back pain    "right side; last 6 wks" (10/10/2016)  . Complication of anesthesia 2007   POST-OP URINARY RETENTION  . Dyspnea    on exertion;   . History of meniscal tear    left  . History  of urinary retention 2007   POST OP  (BPH W/ BLADDER OUTLET OBSTRUCTION)  . Hypertension   . IDA (iron deficiency anemia)   . Insomnia   . Iritis   . Pulmonary nodule, left 10/16/2016  . SOB (shortness of breath) on exertion     Past Surgical History:  Procedure Laterality Date  . BENIGN PENILE GROWTH REMOVED  1985  . CARDIAC CATHETERIZATION  2000   NORMAL  (FALSE POSITIVE STRESS TEST)    . CARDIOVERSION N/A 10/12/2016   Procedure: CARDIOVERSION;  Surgeon: Quintella Reichert, MD;  Location: [facility] ENDOSCOPY;  Service: Cardiovascular;  Laterality: N/A;  . CATARACT EXTRACTION W/ INTRAOCULAR LENS  IMPLANT, BILATERAL Bilateral  06/2015-07/2015  . INGUINAL HERNIA REPAIR Right 07-19-2006  . KNEE ARTHROSCOPY  03/13/2012   Procedure: ARTHROSCOPY KNEE;  Surgeon: Loanne Drilling, MD;  Location: [facility];  Service: Orthopedics;  Laterality: Left;  WITH DEBRIDEMENT medial meniscus  . REPAIR RECURRENT RIGHT INGUINAL HERNIA  12-03-2006  . TEE WITHOUT CARDIOVERSION N/A 10/12/2016   Procedure: TRANSESOPHAGEAL ECHOCARDIOGRAM (TEE);  Surgeon: Quintella Reichert, MD;  Location: [facility] ENDOSCOPY;  Service: Cardiovascular;  Laterality: N/A;     Current Outpatient Prescriptions  Medication Sig Dispense Refill  . amiodarone (PACERONE) 100 MG tablet Take 1 tablet (100 mg total) by mouth daily. 30 tablet 5  . Ascorbic Acid (VITAMIN C) 1000 MG tablet Take 1,000 mg by mouth daily.    Marland Kitchen aspirin EC 81 MG tablet Take 81 mg by mouth daily.    Marland Kitchen atorvastatin (LIPITOR) 40 MG tablet Take 1 tablet (40 mg total) by mouth daily at 6 PM. 30 tablet 11  . carvedilol (COREG) 6.25 MG tablet Take 1 tablet (6.25 mg total) by mouth 2 (two) times daily. 60 tablet 11  . chlorhexidine (PERIDEX) 0.12 % solution 10 mLs by Mouth Rinse route 2 (two) times daily.   3  . Cholecalciferol (VITAMIN D3 PO) Take 1 tablet by mouth daily with supper.    . Clotrimazole 1 % OINT Apply topically.    . Coenzyme Q10 (COQ-10 PO) Take 1 capsule by mouth daily with supper.    . Cyanocobalamin (VITAMIN B-12 PO) Take by mouth. Take one 500mg  tab daily    . diazepam (VALIUM) 10 MG tablet Take 10-20 mg by mouth 4 (four) times daily as needed for anxiety.     Marland Kitchen FERREX 150 150 MG capsule Take 150 mg by mouth daily.  6  . finasteride (PROSCAR) 5 MG tablet Take 5 mg by mouth every evening.    . furosemide (LASIX) 40 MG tablet Take 1 tablet by mouth daily.    Marland Kitchen losartan (COZAAR) 25 MG tablet Take 1 tablet (25 mg total) by mouth daily. 30 tablet 11  . MILK THISTLE PO Take 1 tablet by mouth daily with supper.    . Omega-3 Fatty Acids (OMEGA 3 PO) Take 1 capsule by mouth daily  with supper.    Marland Kitchen OVER THE COUNTER MEDICATION HMF Forte (Probiotic Supplement) daily    . OVER THE COUNTER MEDICATION Take 1 tablet by mouth every evening. Beta-Sitosterol 60MG  daily     . ranitidine (ZANTAC) 150 MG tablet Take 1 tablet (150 mg total) by mouth 2 (two) times daily. 60 tablet 3  . traZODone (DESYREL) 100 MG tablet Take 200 mg by mouth at bedtime.     Marland Kitchen VIT B12-METHIONINE-INOS-CHOL IM Inject into the muscle.    Marland Kitchen VITAMIN A PO Take 1 tablet by mouth daily.    Marland Kitchen  vitamin E 400 UNIT capsule Take 400 Units by mouth daily.      No current facility-administered medications for this visit.     Allergies:   Iodinated diagnostic agents    Social History:  The patient  reports that he quit smoking about 13 months ago. His smoking use included Cigars. He quit after 25.00 years of use. He quit smokeless tobacco use about 20 years ago. His smokeless tobacco use included Chew. He reports that he drinks alcohol. He reports that he uses drugs, including Cocaine.   Family History:  The patient's family history includes Anxiety disorder in his father; Cerebrovascular Accident in his mother; Depression in his father; Heart disease in his father; Hodgkin's lymphoma in his mother; Hypertension in his father.    ROS:  Please see the history of present illness.   Otherwise, review of systems are positive for none.   All other systems are reviewed and negative.    PHYSICAL EXAM: VS:  BP 127/71   Pulse (!) 50   Ht 5' 3.5" (1.613 m)   Wt 87.5 kg (192 lb 12.8 oz)   BMI 33.62 kg/m  , BMI Body mass index is 33.62 kg/m. GENERAL:  Well appearing HEENT:  Pupils equal round and reactive, fundi not visualized, oral mucosa unremarkable NECK:  No jugular venous distention, waveform within normal limits, carotid upstroke brisk and symmetric, no bruits, no thyromegaly LYMPHATICS:  No cervical adenopathy LUNGS:  Clear to auscultation bilaterally HEART:  Regular rate and rhythm.  Bradycaric.  PMI not  displaced or sustained,S1 and S2 within normal limits, no S3, no S4, no clicks, no rubs, no murmurs ABD:  Flat, positive bowel sounds normal in frequency in pitch, no bruits, no rebound, no guarding, no midline pulsatile mass, no hepatomegaly, no splenomegaly EXT:  2 plus pulses throughout, 2+ pitting edema to the upper tibia bilaterally.  R>L, no cyanosis no clubbing SKIN:  No rashes no nodules NEURO:  Cranial nerves II through XII grossly intact, motor grossly intact throughout PSYCH:  Cognitively intact, oriented to person place and time   EKG:  EKG is ordered today. The ekg ordered 11/14/16 demonstrates atrial fibrillation.  Rate 92 bpm.  RBBB 01/18/17:  Sinus bradycardia.  Rate 50 bpm.  RBBB.  ECHO: 10/11/16 - Left ventricle: The cavity size was normal. There was mild focal basal hypertrophy of the septum. Systolic function was moderately reduced. The estimated ejection fraction was in the range of 35% to 40%. Diffuse hypokinesis. - Aortic valve: Trileaflet; mildly thickened, mildly calcified leaflets. - Left atrium: The atrium was moderately dilated.  TEE/DCCV: 10/26 - Left ventricle: Systolic function was mildly to moderately reduced. The estimated ejection fraction was in the range of 40% to 45%. Wall motion was normal; there were no regional wall motion abnormalities. - Aortic valve: There was trivial regurgitation. - Left atrium: The atrium was mildly to moderately dilated. No evidence of thrombus in the atrial cavity or appendage. - Right atrium: The atrium was mildly dilated. No evidence of thrombus in the atrial cavity or appendage. - Tricuspid valve: No evidence of vegetation. - Pulmonic valve: No evidence of vegetation.  Recent Labs: 10/10/2016: ALT 26; TSH 1.170 10/11/2016: B Natriuretic Peptide 373.8 11/03/2016: Hemoglobin 9.9; Platelets 214 11/27/2016: BUN 18; Creat 0.59; Potassium 4.2; Sodium 140    Lipid Panel    Component Value Date/Time    CHOL 163 10/11/2016 0228   TRIG 76 10/11/2016 0228   HDL 49 10/11/2016 0228   CHOLHDL 3.3 10/11/2016  0228   VLDL 15 10/11/2016 0228   LDLCALC 99 10/11/2016 0228      Wt Readings from Last 3 Encounters:  01/18/17 87.5 kg (192 lb 12.8 oz)  12/13/16 83.3 kg (183 lb 9.6 oz)  12/04/16 83.9 kg (185 lb)      ASSESSMENT AND PLAN:  # Chronic systolic and diastolic heart failure: Etiology either due to alcohol abuse or atrial fibrillation with RVR.  LVEF 35-40% 09/2016.  We will repeat his echo.  Continue carvedilol and losartan.  If LVEF remains reduced, add spironolactone.  Continue lasix 40 mg daily.  # Paroxysmal atrial fibrillation:  Adrian Ramos is in sinus rhythm.  We will reduce amiodarone to 100mg  daily due to bradycardia.  He is asymptomatic.  We will not reduce carvedilol due to his systolic dysfunction.  His anticoagulation has been held due to melena.  Endoscopy scheduled for 01/26/17.  H/H increasing with iron supplementation.  # Hypertension:  BP controlled on carvedilol and losartan.     Current medicines are reviewed at length with the patient today.  The patient does not have concerns regarding medicines.  The following changes have been made:  Reduce amiodarone to 100 mg daily.    Labs/ tests ordered today include:   Orders Placed This Encounter  Procedures  . EKG 12-Lead  . ECHOCARDIOGRAM COMPLETE     Disposition:  Lusia Greis C. Duke Salviaandolph, MD, [facility] in 3 months.     This note was written with the assistance of speech recognition software.  Please excuse any transcriptional errors.  Signed, Callum Wolf C. Duke Salviaandolph, MD, [facility]  01/18/2017 11:19 AM    [facility]

## 2017-01-18 NOTE — Patient Instructions (Signed)
Medication Instructions:  DECREASE YOUR AMIODARONE 100 MG DAILY   Labwork: NONE  Testing/Procedures: Your physician has requested that you have an echocardiogram. Echocardiography is a painless test that uses sound waves to create images of your heart. It provides your doctor with information about the size and shape of your heart and how well your heart's chambers and valves are working. This procedure takes approximately one hour. There are no restrictions for this procedure.  Follow-Up: Your physician wants you to follow-up in: 3 MONTH OV You will receive a reminder letter in the mail two months in advance. If you don't receive a letter, please call our office to schedule the follow-up appointment.  If you need a refill on your cardiac medications before your next appointment, please call your pharmacy.

## 2017-01-19 LAB — PATHOLOGIST SMEAR REVIEW

## 2017-01-22 ENCOUNTER — Encounter (HOSPITAL_COMMUNITY): Payer: Self-pay

## 2017-01-22 ENCOUNTER — Encounter: Payer: Self-pay | Admitting: Gastroenterology

## 2017-01-22 ENCOUNTER — Emergency Department (HOSPITAL_COMMUNITY)
Admission: EM | Admit: 2017-01-22 | Discharge: 2017-01-22 | Disposition: A | Discharge status: Home / Self Care | Payer: Medicare Other | Attending: Emergency Medicine | Admitting: Emergency Medicine

## 2017-01-22 DIAGNOSIS — R338 Other retention of urine: Secondary | ICD-10-CM

## 2017-01-22 DIAGNOSIS — R339 Retention of urine, unspecified: Secondary | ICD-10-CM | POA: Diagnosis not present

## 2017-01-22 DIAGNOSIS — Z79899 Other long term (current) drug therapy: Secondary | ICD-10-CM | POA: Insufficient documentation

## 2017-01-22 DIAGNOSIS — I110 Hypertensive heart disease with heart failure: Secondary | ICD-10-CM | POA: Diagnosis not present

## 2017-01-22 DIAGNOSIS — I5041 Acute combined systolic (congestive) and diastolic (congestive) heart failure: Secondary | ICD-10-CM | POA: Diagnosis not present

## 2017-01-22 DIAGNOSIS — Z87891 Personal history of nicotine dependence: Secondary | ICD-10-CM | POA: Diagnosis not present

## 2017-01-22 DIAGNOSIS — Z7982 Long term (current) use of aspirin: Secondary | ICD-10-CM | POA: Diagnosis not present

## 2017-01-22 LAB — URINALYSIS, ROUTINE W REFLEX MICROSCOPIC
BILIRUBIN URINE: NEGATIVE
GLUCOSE, UA: NEGATIVE mg/dL
Ketones, ur: NEGATIVE mg/dL
Leukocytes, UA: NEGATIVE
Nitrite: NEGATIVE
Protein, ur: NEGATIVE mg/dL
SPECIFIC GRAVITY, URINE: 1.01 (ref 1.005–1.030)
pH: 5 (ref 5.0–8.0)

## 2017-01-22 LAB — URINALYSIS, MICROSCOPIC (REFLEX): WBC, UA: NONE SEEN WBC/hpf (ref 0–5)

## 2017-01-22 NOTE — ED Notes (Signed)
Pt discharged home with foley and leg bag. Pt educated on how to properly clean to help reduce infection.

## 2017-01-22 NOTE — ED Provider Notes (Signed)
[facility]-EMERGENCY DEPT Provider Note   CSN: [id number] Arrival date & time: 01/22/17  4098 By signing my name below, I, Adrian Ramos, attest that this documentation has been prepared under the direction and in the presence of Adrian Booze, MD . Electronically Signed: Levon Ramos, Scribe. 01/22/2017. 3:18 AM.   History   Chief Complaint Chief Complaint  Patient presents with  . Urinary Retention    HPI Adrian Ramos is a 71 y.o. male with a PMHx of urinary retention and BPH who presents to the Emergency Department complaining of urinary retention onset yesterday. Per pt, he last voided yesterday at 6 AM. He notes associated constant, 9/10 abdominal pain and abdominal distention. Pt states he has had this problem before and states he needed chronic foley. He is followed by [facility]. Pt has no other complaints or symptoms at this time.   The history is provided by the patient. No language interpreter was used.   Past Medical History:  Diagnosis Date  . A-fib (HCC)   . Acute medial meniscal injury of knee LEFT  . Alcohol abuse, in remission   . Anxiety   . Back pain    right side; occurs more with movement  . BPH (benign prostatic hypertrophy)   . CHF (congestive heart failure) (HCC)    EF-40-45 %  . Chronic midline back pain    "right side; last 6 wks" (10/10/2016)  . Complication of anesthesia 2007   POST-OP URINARY RETENTION  . Dyspnea    on exertion;   . History of meniscal tear    left  . History of urinary retention 2007   POST OP  (BPH W/ BLADDER OUTLET OBSTRUCTION)  . Hypertension   . IDA (iron deficiency anemia)   . Insomnia   . Iritis   . Pulmonary nodule, left 10/16/2016  . SOB (shortness of breath) on exertion     Patient Active Problem List   Diagnosis Date Noted  . GIB (gastrointestinal bleeding) 11/03/2016  . Iron deficiency 11/03/2016  . Medication management 10/26/2016  . Pulmonary nodule, left 10/16/2016  . Alcohol abuse 10/13/2016  . Acute  combined systolic and diastolic heart failure (HCC) 10/12/2016  . Preoperative clearance 10/10/2016  . Inguinal hernia 10/10/2016  . Atrial fibrillation with RVR (HCC) 10/10/2016  . Acute medial meniscal tear 03/13/2012    Past Surgical History:  Procedure Laterality Date  . BENIGN PENILE GROWTH REMOVED  1985  . CARDIAC CATHETERIZATION  2000   NORMAL  (FALSE POSITIVE STRESS TEST)    . CARDIOVERSION N/A 10/12/2016   Procedure: CARDIOVERSION;  Surgeon: Quintella Reichert, MD;  Location: [facility] ENDOSCOPY;  Service: Cardiovascular;  Laterality: N/A;  . CATARACT EXTRACTION W/ INTRAOCULAR LENS  IMPLANT, BILATERAL Bilateral 06/2015-07/2015  . INGUINAL HERNIA REPAIR Right 07-19-2006  . KNEE ARTHROSCOPY  03/13/2012   Procedure: ARTHROSCOPY KNEE;  Surgeon: Loanne Drilling, MD;  Location: [facility];  Service: Orthopedics;  Laterality: Left;  WITH DEBRIDEMENT medial meniscus  . REPAIR RECURRENT RIGHT INGUINAL HERNIA  12-03-2006  . TEE WITHOUT CARDIOVERSION N/A 10/12/2016   Procedure: TRANSESOPHAGEAL ECHOCARDIOGRAM (TEE);  Surgeon: Quintella Reichert, MD;  Location: [facility] ENDOSCOPY;  Service: Cardiovascular;  Laterality: N/A;       Home Medications    Prior to Admission medications   Medication Sig Start Date End Date Taking? Authorizing Provider  amiodarone (PACERONE) 100 MG tablet Take 1 tablet (100 mg total) by mouth daily. 01/18/17   Chilton Si, MD  Ascorbic Acid (VITAMIN  C) 1000 MG tablet Take 1,000 mg by mouth daily.    Historical Provider, MD  aspirin EC 81 MG tablet Take 81 mg by mouth daily.    Historical Provider, MD  atorvastatin (LIPITOR) 40 MG tablet Take 1 tablet (40 mg total) by mouth daily at 6 PM. 10/16/16   Rhonda G Barrett, PA-C  carvedilol (COREG) 6.25 MG tablet Take 1 tablet (6.25 mg total) by mouth 2 (two) times daily. 10/16/16   Rhonda G Barrett, PA-C  chlorhexidine (PERIDEX) 0.12 % solution 10 mLs by Mouth Rinse route 2 (two) times daily.  09/22/16   Historical  Provider, MD  Cholecalciferol (VITAMIN D3 PO) Take 1 tablet by mouth daily with supper.    Historical Provider, MD  Clotrimazole 1 % OINT Apply topically.    Historical Provider, MD  Coenzyme Q10 (COQ-10 PO) Take 1 capsule by mouth daily with supper.    Historical Provider, MD  Cyanocobalamin (VITAMIN B-12 PO) Take by mouth. Take one 500mg  tab daily    Historical Provider, MD  diazepam (VALIUM) 10 MG tablet Take 10-20 mg by mouth 4 (four) times daily as needed for anxiety.     Historical Provider, MD  FERREX 150 150 MG capsule Take 150 mg by mouth daily. 11/13/16   Historical Provider, MD  finasteride (PROSCAR) 5 MG tablet Take 5 mg by mouth every evening.    Historical Provider, MD  furosemide (LASIX) 40 MG tablet Take 1 tablet by mouth daily. 11/13/16   Historical Provider, MD  losartan (COZAAR) 25 MG tablet Take 1 tablet (25 mg total) by mouth daily. 10/17/16   Rhonda G Barrett, PA-C  MILK THISTLE PO Take 1 tablet by mouth daily with supper.    Historical Provider, MD  Omega-3 Fatty Acids (OMEGA 3 PO) Take 1 capsule by mouth daily with supper.    Historical Provider, MD  OVER THE COUNTER MEDICATION HMF Forte (Probiotic Supplement) daily    Historical Provider, MD  OVER THE COUNTER MEDICATION Take 1 tablet by mouth every evening. Beta-Sitosterol 60MG  daily     Historical Provider, MD  ranitidine (ZANTAC) 150 MG tablet Take 1 tablet (150 mg total) by mouth 2 (two) times daily. 12/04/16   Unk LightningJennifer Lynne Lemmon, PA  traZODone (DESYREL) 100 MG tablet Take 200 mg by mouth at bedtime.     Historical Provider, MD  VIT B12-METHIONINE-INOS-CHOL IM Inject into the muscle.    Historical Provider, MD  VITAMIN A PO Take 1 tablet by mouth daily.    Historical Provider, MD  vitamin E 400 UNIT capsule Take 400 Units by mouth daily.     Historical Provider, MD    Family History Family History  Problem Relation Age of Onset  . Cerebrovascular Accident Mother   . Hodgkin's lymphoma Mother   . Depression  Father   . Heart disease Father   . Hypertension Father   . Anxiety disorder Father   . Colon cancer Neg Hx     Social History Social History  Substance Use Topics  . Smoking status: Former Smoker    Years: 25.00    Types: Cigars    Quit date: 12/07/2015  . Smokeless tobacco: Former NeurosurgeonUser    Types: Chew    Quit date: 12/18/1996     Comment: 2 CIGARS DAILY  . Alcohol use Yes     Comment: 10/10/2016 "heavy drinker till aprox 1 month ago"     Allergies   Iodinated diagnostic agents   Review of Systems Review of  Systems  Gastrointestinal: Positive for abdominal distention and abdominal pain.  Genitourinary: Positive for difficulty urinating.  All other systems reviewed and are negative.  Physical Exam Updated Vital Signs BP 118/92   Pulse 75   Temp 97.5 F (36.4 C)   Resp 20   Ht 5\' 3"  (1.6 m)   Wt 205 lb (93 kg)   SpO2 97%   BMI 36.31 kg/m   Physical Exam  Constitutional: He is oriented to person, place, and time. He appears well-developed and well-nourished.  HENT:  Head: Normocephalic and atraumatic.  Eyes: EOM are normal. Pupils are equal, round, and reactive to light.  Neck: Normal range of motion. Neck supple. No JVD present.  Cardiovascular: Normal rate, regular rhythm and normal heart sounds.   No murmur heard. Pulmonary/Chest: Effort normal and breath sounds normal. He has no wheezes. He has no rales. He exhibits no tenderness.  Abdominal: Soft. Bowel sounds are normal. He exhibits distension. He exhibits no mass. There is no tenderness.  Bladder distended to the umbilicus.   Musculoskeletal: Normal range of motion. He exhibits edema (Trace pretibial edema).  Lymphadenopathy:    He has no cervical adenopathy.  Neurological: He is alert and oriented to person, place, and time. No cranial nerve deficit. He exhibits normal muscle tone. Coordination normal.  Skin: Skin is warm and dry. No rash noted.  Psychiatric: He has a normal mood and affect. His  behavior is normal. Judgment and thought content normal.  Nursing note and vitals reviewed.  ED Treatments / Results  DIAGNOSTIC STUDIES:  Oxygen Saturation is 97% on RA, normal by my interpretation.    COORDINATION OF CARE:  3:10 AM Discussed treatment plan with pt at bedside and pt agreed to plan.  Labs (all labs ordered are listed, but only abnormal results are displayed) Labs Reviewed  URINALYSIS, ROUTINE W REFLEX MICROSCOPIC - Abnormal; Notable for the following:       Result Value   Hgb urine dipstick TRACE (*)    All other components within normal limits  URINALYSIS, MICROSCOPIC (REFLEX) - Abnormal; Notable for the following:    Bacteria, UA RARE (*)    Squamous Epithelial / LPF 0-5 (*)    All other components within normal limits  URINE CULTURE   Procedures Procedures (including critical care time)  Medications Ordered in ED Medications - No data to display   Initial Impression / Assessment and Plan / ED Course  I have reviewed the triage vital signs and the nursing notes.  Pertinent lab results that were available during my care of the patient were reviewed by me and considered in my medical decision making (see chart for details).  Acute urinary retention. Bladder scan confirms a full bladder. Foley catheter was placed and drained over 800 mL of clear urine. He is discharged with instructions to follow-up with his urologist.  Final Clinical Impressions(s) / ED Diagnoses   Final diagnoses:  Acute urinary retention    New Prescriptions New Prescriptions   No medications on file    I personally performed the services described in this documentation, which was scribed in my presence. The recorded information has been reviewed and is accurate.      Adrian Booze, MD 01/22/17 [phone]

## 2017-01-22 NOTE — ED Triage Notes (Signed)
Pt states he is having bladder pain and has not voided in the past 24 hours, pt states that he has had this problem in the past and needed chronic foley, pt sees [facility]

## 2017-01-23 ENCOUNTER — Telehealth: Payer: Self-pay

## 2017-01-23 LAB — URINE CULTURE: CULTURE: NO GROWTH

## 2017-01-23 NOTE — Telephone Encounter (Signed)
-----   Message from Steven Paul Armbruster, MD sent at 01/19/2017  2:26 PM EST ----- Bristyl Mclees can you please let this patient know that his repeat blood testing showed that his hemoglobin is now normal which is excellent news. He should continue his iron supplementation and we will await his scheduled EGD and colonoscopy a little bit later this month.  Of note the pathologist reviewed his white blood cells, it did not find any significant abnormality in the white blood cells, but noted changes consistent with iron deficiency anemia which we already know about. 

## 2017-01-23 NOTE — Telephone Encounter (Signed)
Pt informed of results. He mentioned that he had a urinary catheter placed yesterday due to urinary retention. Pt states that he has seen some blood in his urine but the urologist instructed that this was probably coming from an abrasion during insertion of the catheter.  He is unsure whether he will be able to have endoscopy procedures and will call us with an update.

## 2017-01-23 NOTE — Telephone Encounter (Signed)
-----   Message from Ruffin FrederickSteven Paul Armbruster, Adrian Ramos sent at 01/19/2017  2:26 PM EST ----- Morrie SheldonAshley can you please let this patient know that his repeat blood testing showed that his hemoglobin is now normal which is excellent news. He should continue his iron supplementation and we will await his scheduled EGD and colonoscopy a little bit later this month.  Of note the pathologist reviewed his white blood cells, it did not find any significant abnormality in the white blood cells, but noted changes consistent with iron deficiency anemia which we already know about.

## 2017-02-05 ENCOUNTER — Telehealth: Payer: Self-pay | Admitting: Cardiovascular Disease

## 2017-02-05 ENCOUNTER — Encounter: Payer: Medicare Other | Admitting: Gastroenterology

## 2017-02-05 MED ORDER — AMIODARONE HCL 100 MG PO TABS: 100.0000 mg | ORAL_TABLET | Freq: Every day | ORAL | 5 refills | Status: DC

## 2017-02-05 NOTE — Telephone Encounter (Signed)
New Message     *STAT* If patient is at the pharmacy, call can be transferred to refill team.   1. Which medications need to be refilled? (please list name of each medication and dose if known) amiodarone (PACERONE) 100 MG tablet  2. Which pharmacy/location (including street and city if local pharmacy) is medication to be sent to? [facility] [address]  3. Do they need a 30 day or 90 day supply? 30 day supply

## 2017-02-05 NOTE — Telephone Encounter (Signed)
Med refilled.

## 2017-02-13 ENCOUNTER — Telehealth: Payer: Self-pay | Admitting: Cardiovascular Disease

## 2017-02-13 NOTE — Telephone Encounter (Signed)
New Message    Pt states that he has had urinary retention since feb 3rd or 4th. Pt states he ended up in the Er. He was on a foley catheter bag from that time till now. It was not better 2 weeks ago and today he actually has been able to urinate well. Pt wants to know if he should proceed with the ECHO being that he was on a foley cath. Requests someone to call back. Pt states can leave VM with info if needed.

## 2017-02-13 NOTE — Telephone Encounter (Signed)
Returned call, advised patient there should not be any difficulty getting echo with indwelling cath. Pt will make sure technician knows he has one so that any adjustments in position may be made without issue.  Aware to call if new concerns, all questions addressed -- pt voiced understanding and thanks for call.

## 2017-02-15 ENCOUNTER — Other Ambulatory Visit (HOSPITAL_COMMUNITY): Payer: Medicare Other

## 2017-03-05 ENCOUNTER — Ambulatory Visit (HOSPITAL_COMMUNITY): Payer: Medicare Other | Attending: Cardiovascular Disease

## 2017-03-05 ENCOUNTER — Other Ambulatory Visit: Payer: Self-pay

## 2017-03-05 DIAGNOSIS — I5041 Acute combined systolic (congestive) and diastolic (congestive) heart failure: Secondary | ICD-10-CM

## 2017-03-05 HISTORY — PX: TRANSTHORACIC ECHOCARDIOGRAM: SHX275

## 2017-03-08 ENCOUNTER — Telehealth: Payer: Self-pay | Admitting: Cardiovascular Disease

## 2017-03-08 NOTE — Telephone Encounter (Signed)
Received records from [facility] for appointment on 05/07/17 with Dr Duke Salviaandolph.  Records put with Dr Leonides Sakeandolph's schedule for 05/07/17. lp

## 2017-03-12 ENCOUNTER — Other Ambulatory Visit: Payer: Self-pay | Admitting: Internal Medicine

## 2017-03-12 DIAGNOSIS — Z136 Encounter for screening for cardiovascular disorders: Secondary | ICD-10-CM

## 2017-03-28 ENCOUNTER — Ambulatory Visit
Admission: RE | Admit: 2017-03-28 | Discharge: 2017-03-28 | Disposition: A | Discharge status: Home / Self Care | Payer: Medicare Other | Source: Ambulatory Visit | Attending: Internal Medicine | Admitting: Internal Medicine

## 2017-03-28 DIAGNOSIS — Z136 Encounter for screening for cardiovascular disorders: Secondary | ICD-10-CM

## 2017-04-19 ENCOUNTER — Other Ambulatory Visit: Payer: Self-pay | Admitting: Urology

## 2017-04-23 ENCOUNTER — Telehealth: Payer: Self-pay | Admitting: *Deleted

## 2017-04-23 NOTE — Telephone Encounter (Signed)
Request for surgical clearance:  1. What type of surgery is being performed? Transurethral Resection of the Prostate   2. When is this surgery scheduled? TBD   3. Are there any medications that need to be held prior to surgery and how long?Aspirin    4. Name of physician performing surgery? Dr Marcine MatarStephen Dahlstedt   5. What is your office phone and fax number? Phone [phone] fax [phone]   Will forward to Dr Duke Salviaandolph for review

## 2017-04-27 NOTE — Telephone Encounter (Signed)
Sent via Epic

## 2017-04-27 NOTE — Telephone Encounter (Signed)
Acceptable risk for surgery.  Hold Eliquis for 3 days prior to surgery.

## 2017-04-30 ENCOUNTER — Telehealth: Payer: Self-pay | Admitting: Cardiovascular Disease

## 2017-04-30 NOTE — Telephone Encounter (Signed)
New message    Adrian MartinezSelita is calling about clearance they received. She said it says to stop eliquis 5 days before surgery but pt isn't on eliquis. She states he is on aspirin and they need to know if he can stop it 5 days before surgery.

## 2017-04-30 NOTE — Telephone Encounter (Signed)
Will forward to Dr Venango for review  

## 2017-04-30 NOTE — Telephone Encounter (Signed)
Will forward to Pharm D for review  

## 2017-04-30 NOTE — Telephone Encounter (Signed)
Patient never started Eliquis due to melena.  Will need to clear aspirin hold with MD.  Our protocol only allows holding of warfarin/NOAC's.

## 2017-04-30 NOTE — Telephone Encounter (Signed)
OK to hold aspirin 3 days prior.

## 2017-05-01 NOTE — Telephone Encounter (Signed)
Sent via Epic

## 2017-05-05 ENCOUNTER — Other Ambulatory Visit: Payer: Self-pay | Admitting: Physician Assistant

## 2017-05-05 NOTE — Progress Notes (Addendum)
Cardiology Office Note   Date:  05/07/2017   ID:  MEGAN HAYDUK, DOB [birth year], MRN [id number]  PCP:  Martha Clan, MD  Cardiologist:   Chilton Si, MD   Chief Complaint  Patient presents with  . Follow-up    3 months;     History of Present Illness: Adrian Ramos is a 71 y.o. male with non-obstructive CAD, paroxysmal atrial fibrillation, alcohol abuse, who presents for follow up. Adrian Ramos originally saw Dr. Mayford Knife on 10/10/73 surgical clearance prior to inguinal hernia repair. At that appointment he was noted to be in atrial fibrillation with rapid ventricular response. He was also short of breath. He was admitted to [facility]. Echocardiogram that hospitalization revealed LVEF 35-40%. It was unclear whether his heart failure was due to atrial fibrillation with rapid ventricular response or alcohol abuse. He was started on Xarelto and underwent TEE/DCCV on 10/12/16.  This was unsuccessful and he went back into atrial fibrillation. He was subsequently started on amiodarone and spontaneously converted to sinus rhythm.  He had a [place] Myoview that revealed a large, fixed defect in the inferoseptal region. There was no inducible ischemia. He then had a cardiac CT that showed moderate coronary artery disease in the LAD and mild atherosclerosis in the RCA.  He was subsequently noted to be back in atrial fibrillation in clinic on 10/26/16.  His hemoglobin dropped from 14 to 11 and he was started on a PPI.    At his last appointment amiodarone was reduced to 100 mg daily.  Since his last appointment Adrian Ramos had an echo 03/05/17 that revealed LVEF 55-60%.  He had an episode of urinary retention 01/2017 and is scheduled for TURP 05/16/2017.  He stopped taking iron and his h/h has been stable.  He denies melena or hematochezia.  He was scheduled for EGD but this hasn't occurred.  He continues to abstain from EtOH.  Adrian Ramos has been feeling well and denies chest pain or  shortness of breath. Adrian Ramos denies lower extremity edema, orthopnea, or PND.  His BP at home has been in the 90s-110s with HR 40-50s. He denies lightheadedness or dizziness.  He also hasn't noted any palpitatins.    Past Medical History:  Diagnosis Date  . A-fib (HCC)   . Acute medial meniscal injury of knee LEFT  . Alcohol abuse, in remission   . Anxiety   . Back pain    right side; occurs more with movement  . BPH (benign prostatic hypertrophy)   . CHF (congestive heart failure) (HCC)    EF-40-45 %  . Chronic midline back pain    "right side; last 6 wks" (10/10/2016)  . Complication of anesthesia 2007   POST-OP URINARY RETENTION  . Dyspnea    on exertion;   . History of meniscal tear    left  . History of urinary retention 2007   POST OP  (BPH W/ BLADDER OUTLET OBSTRUCTION)  . Hypertension   . IDA (iron deficiency anemia)   . Insomnia   . Iritis   . Pulmonary nodule, left 10/16/2016  . SOB (shortness of breath) on exertion     Past Surgical History:  Procedure Laterality Date  . BENIGN PENILE GROWTH REMOVED  1985  . CARDIAC CATHETERIZATION  2000   NORMAL  (FALSE POSITIVE STRESS TEST)    . CARDIOVERSION N/A 10/12/2016   Procedure: CARDIOVERSION;  Surgeon: Quintella Reichert, MD;  Location: [facility];  Service: Cardiovascular;  Laterality:  N/A;  . CATARACT EXTRACTION W/ INTRAOCULAR LENS  IMPLANT, BILATERAL Bilateral 06/2015-07/2015  . INGUINAL HERNIA REPAIR Right 07-19-2006  . KNEE ARTHROSCOPY  03/13/2012   Procedure: ARTHROSCOPY KNEE;  Surgeon: Loanne DrillingFrank V Aluisio, MD;  [facility] [place];  Service: Orthopedics;  Laterality: Left;  WITH DEBRIDEMENT medial meniscus  . REPAIR RECURRENT RIGHT INGUINAL HERNIA  12-03-2006  . TEE WITHOUT CARDIOVERSION N/A 10/12/2016   Procedure: TRANSESOPHAGEAL ECHOCARDIOGRAM (TEE);  Surgeon: Quintella Reichertraci R Turner, MD;  [facility];  Service: Cardiovascular;  Laterality: N/A;     Current Outpatient Prescriptions    Medication Sig Dispense Refill  . amiodarone (PACERONE) 100 MG tablet Take 1 tablet (100 mg total) by mouth daily. 30 tablet 5  . Ascorbic Acid (VITAMIN C) 1000 MG tablet Take 1,000 mg by mouth daily.    Marland Kitchen. aspirin EC 81 MG tablet Take 81 mg by mouth daily.    Marland Kitchen. atorvastatin (LIPITOR) 40 MG tablet Take 1 tablet (40 mg total) by mouth daily at 6 PM. 30 tablet 11  . chlorhexidine (PERIDEX) 0.12 % solution 10 mLs by Mouth Rinse route 2 (two) times daily.   3  . Cholecalciferol (VITAMIN D3 PO) Take 1 tablet by mouth daily with supper.    . Clotrimazole 1 % OINT Apply topically.    . Coenzyme Q10 (COQ-10 PO) Take 1 capsule by mouth daily with supper.    . Cyanocobalamin (VITAMIN B-12 PO) Take by mouth. Take one 500mg  tab daily    . diazepam (VALIUM) 10 MG tablet Take 10-20 mg by mouth 4 (four) times daily as needed for anxiety.     . docusate sodium (COLACE) 100 MG capsule Take 100 mg by mouth daily.    Marland Kitchen. FERREX 150 150 MG capsule Take 150 mg by mouth daily.  6  . finasteride (PROSCAR) 5 MG tablet Take 5 mg by mouth every evening.    . furosemide (LASIX) 40 MG tablet Take 1 tablet by mouth daily.    Marland Kitchen. losartan (COZAAR) 25 MG tablet Take 1 tablet (25 mg total) by mouth daily. 30 tablet 11  . MILK THISTLE PO Take 1 tablet by mouth daily with supper.    . Misc Natural Products (PROSTATE HEALTH PO) Take by mouth daily.    . Omega-3 Fatty Acids (OMEGA 3 PO) Take 1 capsule by mouth daily with supper.    Marland Kitchen. OVER THE COUNTER MEDICATION HMF Forte (Probiotic Supplement) daily    . OVER THE COUNTER MEDICATION Take 1 tablet by mouth every evening. Beta-Sitosterol 60MG  daily     . ranitidine (ZANTAC) 150 MG tablet TAKE 1 TABLET (150 MG TOTAL) BY MOUTH 2 (TWO) TIMES DAILY. 60 tablet 3  . traZODone (DESYREL) 100 MG tablet Take 200 mg by mouth at bedtime.     Marland Kitchen. VIT B12-METHIONINE-INOS-CHOL IM Inject into the muscle.    Marland Kitchen. VITAMIN A PO Take 1 tablet by mouth daily.    . vitamin E 400 UNIT capsule Take 400 Units by  mouth daily.      No current facility-administered medications for this visit.     Allergies:   Iodinated diagnostic agents    Social History:  The patient  reports that he quit smoking about 17 months ago. His smoking use included Cigars. He quit after 25.00 years of use. He quit smokeless tobacco use about 20 years ago. His smokeless tobacco use included Chew. He reports that he drinks alcohol. He reports that he does not use drugs.  Family History:  The patient's family history includes Anxiety disorder in his father; Cerebrovascular Accident in his mother; Depression in his father; Heart disease in his father; Hodgkin's lymphoma in his mother; Hypertension in his father.    ROS:  Please see the history of present illness.   Otherwise, review of systems are positive for horse voice.   All other systems are reviewed and negative.    PHYSICAL EXAM: VS:  BP 103/60   Pulse (!) 50   Ht 5\' 3"  (1.6 m)   Wt 84.8 kg (187 lb)   BMI 33.13 kg/m  , BMI Body mass index is 33.13 kg/m. GENERAL:  Well appearing.  No acute distress HEENT:  Pupils equal round and reactive, fundi not visualized, oral mucosa unremarkable NECK:  No jugular venous distention, waveform within normal limits, carotid upstroke brisk and symmetric, no bruits LUNGS:  Clear to auscultation bilaterally.  No crackles, wheezes, or rhonchi HEART: Bradycaric. Regular rate and rhythm.  PMI not displaced or sustained,S1 and S2 within normal limits, no S3, no S4, no clicks, no rubs, no murmurs ABD:  Positive bowel sounds normal in frequency in pitch, no bruits, no rebound, no guarding, no midline pulsatile mass, no hepatomegaly, no splenomegaly EXT:  2 plus pulses throughout. No edema, cyanosis, or clubbing SKIN:  No rashes no nodules NEURO:  Cranial nerves II through XII grossly intact, motor grossly intact throughout PSYCH:  Cognitively intact, oriented to person place and time   EKG:  EKG is ordered today. The ekg ordered  11/14/16 demonstrates atrial fibrillation.  Rate 92 bpm.  RBBB 01/18/17:  Sinus bradycardia.  Rate 50 bpm.  RBBB. 05/07/17: Sinus bradycardia. Rate 50 bpm. Right bundle branch block.  Echo 03/05/17: Study Conclusions  - Left ventricle: The cavity size was normal. Wall thickness was   normal. Systolic function was normal. The estimated ejection   fraction was in the range of 55% to 60%. Wall motion was normal;   there were no regional wall motion abnormalities. Left   ventricular diastolic function parameters were normal. - Left atrium: The atrium was mildly dilated. - Atrial septum: No defect or patent foramen ovale was identified. - Pulmonary arteries: Systolic pressure was mildly increased. PA   peak pressure: 36 mm Hg (S).  ECHO: 10/11/16 - Left ventricle: The cavity size was normal. There was mild focal basal hypertrophy of the septum. Systolic function was moderately reduced. The estimated ejection fraction was in the range of 35% to 40%. Diffuse hypokinesis. - Aortic valve: Trileaflet; mildly thickened, mildly calcified leaflets. - Left atrium: The atrium was moderately dilated.  TEE/DCCV: 10/26 - Left ventricle: Systolic function was mildly to moderately reduced. The estimated ejection fraction was in the range of 40% to 45%. Wall motion was normal; there were no regional wall motion abnormalities. - Aortic valve: There was trivial regurgitation. - Left atrium: The atrium was mildly to moderately dilated. No evidence of thrombus in the atrial cavity or appendage. - Right atrium: The atrium was mildly dilated. No evidence of thrombus in the atrial cavity or appendage. - Tricuspid valve: No evidence of vegetation. - Pulmonic valve: No evidence of vegetation.  Recent Labs: 10/10/2016: ALT 26; TSH 1.170 10/11/2016: B Natriuretic Peptide 373.8 11/27/2016: BUN 18; Creat 0.59; Potassium 4.2; Sodium 140 01/18/2017: Hemoglobin 13.5; Platelets 139.0    Lipid  Panel    Component Value Date/Time   CHOL 163 10/11/2016 0228   TRIG 76 10/11/2016 0228   HDL 49 10/11/2016 0228   CHOLHDL  3.3 10/11/2016 0228   VLDL 15 10/11/2016 0228   LDLCALC 99 10/11/2016 0228      Wt Readings from Last 3 Encounters:  05/07/17 84.8 kg (187 lb)  01/22/17 93 kg (205 lb)  01/18/17 87.5 kg (192 lb 12.8 oz)      ASSESSMENT AND PLAN:  # Chronic systolic and diastolic heart failure, resolved: # Bradycardia: # Hypertension: # Hypotension: Adrian Ramos most recent echo showed improvement and his LVEF to 55-60%. He has no symptoms of heart failure and is not volume overloaded on exam. This may have been related either to alcohol abuse or atrial fibrillation with rapid ventricular response. His blood pressure and heart rate are low, so we will stop carvedilol. Continue losartan. Etiology either due to alcohol abuse or atrial fibrillation with RVR.    # Paroxysmal atrial fibrillation:  Adrian Ramos remains in sinus rhythm.  Continue amiodarone 100 mg daily.  Consider stopping at follow up.  His anticoagulation has been held due to melena.  Endoscopy was scheduled 01/2017 but not performed.  He does not recall why.    Current medicines are reviewe is d at length with the patient today.  The patient does not have concerns regarding medicines.  The following changes have been made:  Stop carvedilol  Labs/ tests ordered today include:   Orders Placed This Encounter  Procedures  . EKG 12-Lead     Disposition:  Adrian Frieden C. Duke Salvia, MD, [facility] in 6 months.     This note was written with the assistance of speech recognition software.  Please excuse any transcriptional errors.  Signed, Jalesa Thien C. Duke Salvia, MD, [facility]  05/07/2017 5:10 PM    [facility]

## 2017-05-07 ENCOUNTER — Encounter: Payer: Self-pay | Admitting: Cardiovascular Disease

## 2017-05-07 ENCOUNTER — Ambulatory Visit (INDEPENDENT_AMBULATORY_CARE_PROVIDER_SITE_OTHER): Payer: Medicare Other | Admitting: Cardiovascular Disease

## 2017-05-07 VITALS — BP 103/60 | HR 50 | Ht 63.0 in | Wt 187.0 lb

## 2017-05-07 DIAGNOSIS — I5042 Chronic combined systolic (congestive) and diastolic (congestive) heart failure: Secondary | ICD-10-CM

## 2017-05-07 DIAGNOSIS — I480 Paroxysmal atrial fibrillation: Secondary | ICD-10-CM | POA: Diagnosis not present

## 2017-05-07 DIAGNOSIS — I10 Essential (primary) hypertension: Secondary | ICD-10-CM | POA: Diagnosis not present

## 2017-05-07 DIAGNOSIS — R001 Bradycardia, unspecified: Secondary | ICD-10-CM | POA: Diagnosis not present

## 2017-05-07 DIAGNOSIS — K922 Gastrointestinal hemorrhage, unspecified: Secondary | ICD-10-CM | POA: Diagnosis not present

## 2017-05-07 NOTE — Patient Instructions (Addendum)
Medication Instructions:  STOP CARVEDILOL   Labwork: NONE  Testing/Procedures: NONE  Follow-Up: Your physician wants you to follow-up in: 6 MONTH OV You will receive a reminder letter in the mail two months in advance. If you don't receive a letter, please call our office to schedule the follow-up appointment.  Any Other Special Instructions Will Be Listed Below (If Applicable). YOU HAVE BEEN CLEARED FOR SURGERY  HOLD YOUR ASPIRIN 3 DAYS PRIOR TO YOUR SURGERY  If you need a refill on your cardiac medications before your next appointment, please call your pharmacy.

## 2017-05-08 ENCOUNTER — Encounter (HOSPITAL_BASED_OUTPATIENT_CLINIC_OR_DEPARTMENT_OTHER): Payer: Self-pay | Admitting: *Deleted

## 2017-05-08 NOTE — Progress Notes (Addendum)
NPO AFTER MN.  ARRIVE AT 0900.  NEEDS ISTAT 8 .  CURRENT EKG IN CHART AND EPIC.  WILL TAKE AMIODARONE, LOSARTAN, AND ZANTAC AM DOS W/ SIPS OF WATER.  PT AWARE TO STOP ASA 3 DAYS PRIOR TO DOS AS PER CARDIOLOGIST ADVISEMENT (DR ).

## 2017-05-16 NOTE — H&P (Signed)
H&P  Chief Complaint: Difficulty voiding  History of Present Illness: 71 yr old male with BPH and significant voiding sx's, now with an indwelling catheter. He has a 140 ml gland, and following UDS revealing obstruction, now presents for TURP.  Past Medical History:  Diagnosis Date  . Alcohol abuse    as of 05-08-2017 per pt in remission since 10/ 2017 down to 2 drinks per month (which is not remission)  . Anxiety   . BPH (benign prostatic hyperplasia)   . Cardiomyopathy (HCC)    currrently ef 55-60% per echo 03-05-2017 up from previous echo 10-11-2016 35-40%  . Chronic back pain   . Complication of anesthesia 2007   POST-OP URINARY RETENTION  . Coronary artery disease C[facility]IOLOG[facility]-  [facility] [place]   cardiac/coronary CT 10-16-2016-- score 97 ,  moderate CAD in LAD and mild CAD in RCA  . Foley catheter in place   . GERD (gastroesophageal reflux disease)   . History of urinary retention 2007   POST OP  (BPH W/ BLADDER OUTLET OBSTRUCTION)  . Hypertension   . IDA (iron deficiency anemia)   . Insomnia   . PAF (paroxysmal atrial fibrillation) (HCC) CARDIOLOGIST-  DR TIFFANY [place]   EPISODE 10/ 2017 FAILED CARDIOVERSION 10-12-2016 BUT CONVERTED TO NSR W/ AMIODORONE  . Pulmonary nodule, left 10/16/2016  . RBBB (right bundle branch block)   . Recurrent right inguinal hernia   . Systolic and diastolic CHF, chronic [facility](HCC)    cardiologist-  dr Duke Salviarandolph ([place]) per 03-05-2017 , ef 55-60%  . Urinary retention     Past Surgical History:  Procedure Laterality Date  . BENIGN PENILE GROWTH REMOVED  1985  . CARDIAC CATHETERIZATION  2000   NORMAL  (FALSE POSITIVE STRESS TEST)    . CARDIOVASCULAR STRESS TEST  10-15-2016  dr Elmarie Shileytiffany [place]    nuclear study w/  large fixed defect in the inferoseptal region, no inducable ischemia/  LVEF 36% and global hypokinesis  . CARDIOVERSION N/A 10/12/2016   Procedure: CARDIOVERSION;  Surgeon: Quintella Reichertraci R Turner, MD;  Location: [facility];  Service:  Cardiovascular;  Laterality: N/A;  . CATARACT EXTRACTION W/ INTRAOCULAR LENS  IMPLANT, BILATERAL Bilateral 06/2015-07/2015  . INGUINAL HERNIA REPAIR Right 07-19-2006  . KNEE ARTHROSCOPY  03/13/2012   Procedure: ARTHROSCOPY KNEE;  Surgeon: Loanne DrillingFrank V Aluisio, MD;  [facility] [place];  Service: Orthopedics;  Laterality: Left;  WITH DEBRIDEMENT medial meniscus  . REPAIR RECURRENT RIGHT INGUINAL HERNIA  12-03-2006  . TEE WITHOUT CARDIOVERSION N/A 10/12/2016   Procedure: TRANSESOPHAGEAL ECHOCARDIOGRAM (TEE);  Surgeon: Quintella Reichertraci R Turner, MD;  [facility];  Service: Cardiovascular;  Laterality: N/A; EF 40-45%/ trivial AR & MR/ mild to moderate LAE/ no evidence of RA thrombus or appendage/ no evidence of vegetation TR or PR  . TRANSTHORACIC ECHOCARDIOGRAM  03/05/2017   normal LVF, ef 55-60%/ mild LAE/ trivial TR/  mild increase PASP 36mmHg    Home Medications:  Allergies as of 05/16/2017      Reactions   Iodinated Diagnostic Agents Hives, Other (See Comments)   Unknown if MRI or CT was used for pelvis scan? different contrasts between them Developed hives 1 hour after scanning needs 13 hour pre med prior to scan      Medication List    Notice   Cannot display discharge medications because the patient has not yet been admitted.     Allergies:  Allergies  Allergen Reactions  . Iodinated Diagnostic Agents Hives and Other (See Comments)  Unknown if MRI or CT was used for pelvis scan? different contrasts between them Developed hives 1 hour after scanning needs 13 hour pre med prior to scan    Family History  Problem Relation Age of Onset  . Cerebrovascular Accident Mother   . Hodgkin's lymphoma Mother   . Depression Father   . Heart disease Father   . Hypertension Father   . Anxiety disorder Father   . Colon cancer Neg Hx     Social History:  reports that he quit smoking about 17 months ago. His smoking use included Cigars. He quit after 25.00 years of use. He  quit smokeless tobacco use about 27 years ago. His smokeless tobacco use included Chew. He reports that he drinks alcohol. He reports that he does not use drugs.  ROS: A complete review of systems was performed.  All systems are negative except for pertinent findings as noted.  Physical Exam:  Vital signs in last 24 hours:   Constitutional:  Alert and oriented, No acute distress Cardiovascular: Regular rate and rhythm, No JVD Respiratory: Normal respiratory effort, Lungs clear bilaterally GI: Abdomen is soft, nontender, nondistended, no abdominal masses Genitourinary: No CVAT. Normal male phallus, testes are descended bilaterally and non-tender and without masses, scrotum is normal in appearance without lesions or masses, perineum is normal on inspection. Rectal: Normal sphincter tone, no rectal masses, prostate is non tender and without nodularity. Prostate size is estimated to be 100 cc Lymphatic: No lymphadenopathy Neurologic: Grossly intact, no focal deficits Psychiatric: Normal mood and affect  Laboratory Data:  No results for input(s): WBC, HGB, HCT, PLT in the last 72 hours.  No results for input(s): NA, K, CL, GLUCOSE, BUN, CALCIUM, CREATININE in the last 72 hours.  Invalid input(s): CO3   No results found for this or any previous visit (from the past 24 hour(s)). No results found for this or any previous visit (from the past 240 hour(s)).  Renal Function: No results for input(s): CREATININE in the last 168 hours. CrCl cannot be calculated (Patient's most recent lab result is older than the maximum 21 days allowed.).  Radiologic Imaging: No results found.  Impression/Assessment:  PH with obstruction  Plan:  TURP

## 2017-05-17 ENCOUNTER — Ambulatory Visit (HOSPITAL_BASED_OUTPATIENT_CLINIC_OR_DEPARTMENT_OTHER): Admit: 2017-05-17 | Payer: Medicare Other | Admitting: Urology

## 2017-05-17 ENCOUNTER — Other Ambulatory Visit: Payer: Self-pay

## 2017-05-17 ENCOUNTER — Inpatient Hospital Stay (HOSPITAL_BASED_OUTPATIENT_CLINIC_OR_DEPARTMENT_OTHER)
Admission: AD | Admit: 2017-05-17 | Discharge: 2017-05-25 | DRG: 264 | Disposition: A | Discharge status: Home Health | Payer: Medicare Other | Source: Ambulatory Visit | Attending: Urology | Admitting: Urology

## 2017-05-17 ENCOUNTER — Encounter (HOSPITAL_BASED_OUTPATIENT_CLINIC_OR_DEPARTMENT_OTHER): Payer: Self-pay | Admitting: Anesthesiology

## 2017-05-17 ENCOUNTER — Encounter (HOSPITAL_COMMUNITY)
Admission: AD | Disposition: A | Discharge status: Home Health | Payer: Self-pay | Source: Ambulatory Visit | Attending: Urology

## 2017-05-17 ENCOUNTER — Ambulatory Visit (HOSPITAL_BASED_OUTPATIENT_CLINIC_OR_DEPARTMENT_OTHER): Payer: Medicare Other | Admitting: Anesthesiology

## 2017-05-17 DIAGNOSIS — R338 Other retention of urine: Secondary | ICD-10-CM | POA: Diagnosis present

## 2017-05-17 DIAGNOSIS — R6520 Severe sepsis without septic shock: Secondary | ICD-10-CM | POA: Diagnosis not present

## 2017-05-17 DIAGNOSIS — I9581 Postprocedural hypotension: Principal | ICD-10-CM | POA: Diagnosis present

## 2017-05-17 DIAGNOSIS — A419 Sepsis, unspecified organism: Secondary | ICD-10-CM

## 2017-05-17 DIAGNOSIS — I2510 Atherosclerotic heart disease of native coronary artery without angina pectoris: Secondary | ICD-10-CM | POA: Diagnosis present

## 2017-05-17 DIAGNOSIS — I429 Cardiomyopathy, unspecified: Secondary | ICD-10-CM | POA: Diagnosis present

## 2017-05-17 DIAGNOSIS — G4700 Insomnia, unspecified: Secondary | ICD-10-CM | POA: Diagnosis present

## 2017-05-17 DIAGNOSIS — Z7982 Long term (current) use of aspirin: Secondary | ICD-10-CM

## 2017-05-17 DIAGNOSIS — Z87891 Personal history of nicotine dependence: Secondary | ICD-10-CM

## 2017-05-17 DIAGNOSIS — E663 Overweight: Secondary | ICD-10-CM | POA: Diagnosis present

## 2017-05-17 DIAGNOSIS — R001 Bradycardia, unspecified: Secondary | ICD-10-CM | POA: Diagnosis present

## 2017-05-17 DIAGNOSIS — J9601 Acute respiratory failure with hypoxia: Secondary | ICD-10-CM | POA: Diagnosis not present

## 2017-05-17 DIAGNOSIS — I4510 Unspecified right bundle-branch block: Secondary | ICD-10-CM | POA: Diagnosis present

## 2017-05-17 DIAGNOSIS — I480 Paroxysmal atrial fibrillation: Secondary | ICD-10-CM | POA: Diagnosis present

## 2017-05-17 DIAGNOSIS — Y95 Nosocomial condition: Secondary | ICD-10-CM | POA: Diagnosis not present

## 2017-05-17 DIAGNOSIS — J9600 Acute respiratory failure, unspecified whether with hypoxia or hypercapnia: Secondary | ICD-10-CM

## 2017-05-17 DIAGNOSIS — Z6832 Body mass index (BMI) 32.0-32.9, adult: Secondary | ICD-10-CM

## 2017-05-17 DIAGNOSIS — K219 Gastro-esophageal reflux disease without esophagitis: Secondary | ICD-10-CM | POA: Diagnosis present

## 2017-05-17 DIAGNOSIS — N138 Other obstructive and reflux uropathy: Secondary | ICD-10-CM | POA: Diagnosis present

## 2017-05-17 DIAGNOSIS — F419 Anxiety disorder, unspecified: Secondary | ICD-10-CM | POA: Diagnosis present

## 2017-05-17 DIAGNOSIS — I5043 Acute on chronic combined systolic (congestive) and diastolic (congestive) heart failure: Secondary | ICD-10-CM | POA: Diagnosis not present

## 2017-05-17 DIAGNOSIS — R05 Cough: Secondary | ICD-10-CM

## 2017-05-17 DIAGNOSIS — N99820 Postprocedural hemorrhage and hematoma of a genitourinary system organ or structure following a genitourinary system procedure: Secondary | ICD-10-CM | POA: Diagnosis present

## 2017-05-17 DIAGNOSIS — N400 Enlarged prostate without lower urinary tract symptoms: Secondary | ICD-10-CM | POA: Diagnosis present

## 2017-05-17 DIAGNOSIS — R059 Cough, unspecified: Secondary | ICD-10-CM

## 2017-05-17 DIAGNOSIS — J690 Pneumonitis due to inhalation of food and vomit: Secondary | ICD-10-CM | POA: Diagnosis not present

## 2017-05-17 DIAGNOSIS — R0902 Hypoxemia: Secondary | ICD-10-CM

## 2017-05-17 DIAGNOSIS — E876 Hypokalemia: Secondary | ICD-10-CM | POA: Diagnosis not present

## 2017-05-17 DIAGNOSIS — R0600 Dyspnea, unspecified: Secondary | ICD-10-CM

## 2017-05-17 DIAGNOSIS — F1010 Alcohol abuse, uncomplicated: Secondary | ICD-10-CM | POA: Diagnosis present

## 2017-05-17 DIAGNOSIS — I110 Hypertensive heart disease with heart failure: Secondary | ICD-10-CM | POA: Diagnosis present

## 2017-05-17 DIAGNOSIS — R579 Shock, unspecified: Secondary | ICD-10-CM | POA: Diagnosis not present

## 2017-05-17 DIAGNOSIS — Z8249 Family history of ischemic heart disease and other diseases of the circulatory system: Secondary | ICD-10-CM

## 2017-05-17 DIAGNOSIS — Z9079 Acquired absence of other genital organ(s): Secondary | ICD-10-CM

## 2017-05-17 DIAGNOSIS — N401 Enlarged prostate with lower urinary tract symptoms: Secondary | ICD-10-CM | POA: Diagnosis present

## 2017-05-17 DIAGNOSIS — D62 Acute posthemorrhagic anemia: Secondary | ICD-10-CM | POA: Diagnosis present

## 2017-05-17 DIAGNOSIS — J441 Chronic obstructive pulmonary disease with (acute) exacerbation: Secondary | ICD-10-CM | POA: Diagnosis not present

## 2017-05-17 DIAGNOSIS — J189 Pneumonia, unspecified organism: Secondary | ICD-10-CM | POA: Diagnosis present

## 2017-05-17 DIAGNOSIS — Z91041 Radiographic dye allergy status: Secondary | ICD-10-CM

## 2017-05-17 HISTORY — DX: Unilateral inguinal hernia, without obstruction or gangrene, recurrent: K40.91

## 2017-05-17 HISTORY — PX: HEMATOMA EVACUATION: SHX5118

## 2017-05-17 HISTORY — DX: Atherosclerotic heart disease of native coronary artery without angina pectoris: I25.10

## 2017-05-17 HISTORY — PX: TRANSURETHRAL RESECTION OF PROSTATE: SHX73

## 2017-05-17 HISTORY — DX: Presence of urogenital implants: Z96.0

## 2017-05-17 HISTORY — DX: Dorsalgia, unspecified: M54.9

## 2017-05-17 HISTORY — DX: Alcohol abuse, uncomplicated: F10.10

## 2017-05-17 HISTORY — DX: Benign prostatic hyperplasia without lower urinary tract symptoms: N40.0

## 2017-05-17 HISTORY — DX: Paroxysmal atrial fibrillation: I48.0

## 2017-05-17 HISTORY — DX: Presence of other specified devices: Z97.8

## 2017-05-17 HISTORY — DX: Other chronic pain: G89.29

## 2017-05-17 HISTORY — DX: Gastro-esophageal reflux disease without esophagitis: K21.9

## 2017-05-17 HISTORY — DX: Cardiomyopathy, unspecified: I42.9

## 2017-05-17 HISTORY — DX: Unspecified right bundle-branch block: I45.10

## 2017-05-17 HISTORY — DX: Retention of urine, unspecified: R33.9

## 2017-05-17 HISTORY — DX: Chronic combined systolic (congestive) and diastolic (congestive) heart failure: I50.42

## 2017-05-17 LAB — POCT I-STAT, CHEM 8
BUN: 20 mg/dL (ref 6–20)
Calcium, Ion: 1.2 mmol/L (ref 1.15–1.40)
Chloride: 100 mmol/L — ABNORMAL LOW (ref 101–111)
Creatinine, Ser: 0.8 mg/dL (ref 0.61–1.24)
Glucose, Bld: 121 mg/dL — ABNORMAL HIGH (ref 65–99)
HEMATOCRIT: 37 % — AB (ref 39.0–52.0)
HEMOGLOBIN: 12.6 g/dL — AB (ref 13.0–17.0)
POTASSIUM: 4.4 mmol/L (ref 3.5–5.1)
SODIUM: 137 mmol/L (ref 135–145)
TCO2: 28 mmol/L (ref 0–100)

## 2017-05-17 LAB — CBC
HCT: 25.9 % — ABNORMAL LOW (ref 39.0–52.0)
HEMOGLOBIN: 8.9 g/dL — AB (ref 13.0–17.0)
MCH: 31.4 pg (ref 26.0–34.0)
MCHC: 34.4 g/dL (ref 30.0–36.0)
MCV: 91.5 fL (ref 78.0–100.0)
Platelets: 142 10*3/uL — ABNORMAL LOW (ref 150–400)
RBC: 2.83 MIL/uL — ABNORMAL LOW (ref 4.22–5.81)
RDW: 15.8 % — ABNORMAL HIGH (ref 11.5–15.5)
WBC: 8.9 10*3/uL (ref 4.0–10.5)

## 2017-05-17 LAB — ABO/RH: ABO/RH(D): A POS

## 2017-05-17 LAB — POCT HEMOGLOBIN-HEMACUE: Hemoglobin: 9.2 g/dL — ABNORMAL LOW (ref 13.0–17.0)

## 2017-05-17 LAB — PREPARE RBC (CROSSMATCH)

## 2017-05-17 SURGERY — TURP (TRANSURETHRAL RESECTION OF PROSTATE)
Anesthesia: General | Site: Prostate

## 2017-05-17 SURGERY — EVACUATION HEMATOMA
Anesthesia: General

## 2017-05-17 MED ORDER — FENTANYL CITRATE (PF) 100 MCG/2ML IJ SOLN
INTRAMUSCULAR | Status: AC
  Filled 2017-05-17: qty 2

## 2017-05-17 MED ORDER — DEXAMETHASONE SODIUM PHOSPHATE 10 MG/ML IJ SOLN
INTRAMUSCULAR | Status: AC
  Filled 2017-05-17: qty 1

## 2017-05-17 MED ORDER — DEXAMETHASONE SODIUM PHOSPHATE 4 MG/ML IJ SOLN
INTRAMUSCULAR | Status: DC | PRN
  Administered 2017-05-17: 10 mg via INTRAVENOUS

## 2017-05-17 MED ORDER — PROMETHAZINE HCL 25 MG/ML IJ SOLN
6.2500 mg | INTRAMUSCULAR | Status: DC | PRN
  Filled 2017-05-17: qty 1

## 2017-05-17 MED ORDER — PROPOFOL 10 MG/ML IV BOLUS
INTRAVENOUS | Status: AC
  Filled 2017-05-17: qty 20

## 2017-05-17 MED ORDER — FAMOTIDINE 20 MG PO TABS
10.0000 mg | ORAL_TABLET | Freq: Every day | ORAL | Status: DC
  Administered 2017-05-17 – 2017-05-24 (×8): 10 mg via ORAL
  Filled 2017-05-17 (×8): qty 1

## 2017-05-17 MED ORDER — FENTANYL CITRATE (PF) 100 MCG/2ML IJ SOLN
INTRAMUSCULAR | Status: DC | PRN
  Administered 2017-05-17 (×8): 25 ug via INTRAVENOUS

## 2017-05-17 MED ORDER — ALBUMIN HUMAN 5 % IV SOLN
12.5000 g | Freq: Once | INTRAVENOUS | Status: AC
  Administered 2017-05-17: 12.5 g via INTRAVENOUS
  Filled 2017-05-17: qty 250

## 2017-05-17 MED ORDER — LIDOCAINE 2% (20 MG/ML) 5 ML SYRINGE
INTRAMUSCULAR | Status: AC
  Filled 2017-05-17: qty 5

## 2017-05-17 MED ORDER — SODIUM CHLORIDE 0.45 % IV SOLN
INTRAVENOUS | Status: DC
  Administered 2017-05-17 – 2017-05-18 (×2): 75 mL/h via INTRAVENOUS

## 2017-05-17 MED ORDER — EPHEDRINE 5 MG/ML INJ
INTRAVENOUS | Status: AC
  Filled 2017-05-17: qty 10

## 2017-05-17 MED ORDER — ONDANSETRON HCL 4 MG/2ML IJ SOLN
INTRAMUSCULAR | Status: DC | PRN
  Administered 2017-05-17: 4 mg via INTRAVENOUS

## 2017-05-17 MED ORDER — LACTATED RINGERS IV SOLN
INTRAVENOUS | Status: DC
  Administered 2017-05-17 (×3): via INTRAVENOUS
  Filled 2017-05-17: qty 1000

## 2017-05-17 MED ORDER — PHENOL 1.4 % MT LIQD
1.0000 | OROMUCOSAL | Status: DC | PRN
  Filled 2017-05-17: qty 177

## 2017-05-17 MED ORDER — LACTATED RINGERS IV SOLN
INTRAVENOUS | Status: DC
  Administered 2017-05-17: 17:00:00 via INTRAVENOUS
  Filled 2017-05-17: qty 1000

## 2017-05-17 MED ORDER — OXYCODONE HCL 5 MG PO TABS: 5.0000 mg | ORAL_TABLET | ORAL | Status: DC | PRN

## 2017-05-17 MED ORDER — EPHEDRINE SULFATE 50 MG/ML IJ SOLN
INTRAMUSCULAR | Status: DC | PRN
  Administered 2017-05-17 (×4): 10 mg via INTRAVENOUS

## 2017-05-17 MED ORDER — ATORVASTATIN CALCIUM 40 MG PO TABS
40.0000 mg | ORAL_TABLET | Freq: Every evening | ORAL | Status: DC
  Administered 2017-05-18 – 2017-05-24 (×7): 40 mg via ORAL
  Filled 2017-05-17 (×7): qty 1

## 2017-05-17 MED ORDER — GLYCOPYRROLATE 0.2 MG/ML IV SOSY
PREFILLED_SYRINGE | INTRAVENOUS | Status: DC | PRN
  Administered 2017-05-17: .2 mg via INTRAVENOUS

## 2017-05-17 MED ORDER — LOSARTAN POTASSIUM 25 MG PO TABS: 25.0000 mg | ORAL_TABLET | Freq: Every morning | ORAL | Status: DC

## 2017-05-17 MED ORDER — ETOMIDATE 2 MG/ML IV SOLN
INTRAVENOUS | Status: DC | PRN
  Administered 2017-05-17: 12 mg via INTRAVENOUS

## 2017-05-17 MED ORDER — AMIODARONE HCL 200 MG PO TABS: 100.0000 mg | ORAL_TABLET | Freq: Every morning | ORAL | Status: DC

## 2017-05-17 MED ORDER — TRAZODONE HCL 50 MG PO TABS
200.0000 mg | ORAL_TABLET | Freq: Every day | ORAL | Status: DC
  Administered 2017-05-17 – 2017-05-24 (×8): 200 mg via ORAL
  Filled 2017-05-17 (×8): qty 4

## 2017-05-17 MED ORDER — SULFAMETHOXAZOLE-TRIMETHOPRIM 800-160 MG PO TABS
1.0000 | ORAL_TABLET | Freq: Two times a day (BID) | ORAL | Status: DC
  Administered 2017-05-17 – 2017-05-21 (×8): 1 via ORAL
  Filled 2017-05-17 (×8): qty 1

## 2017-05-17 MED ORDER — PHENYLEPHRINE HCL 10 MG/ML IJ SOLN
INTRAMUSCULAR | Status: AC
  Filled 2017-05-17: qty 3

## 2017-05-17 MED ORDER — SENNA 8.6 MG PO TABS
1.0000 | ORAL_TABLET | Freq: Two times a day (BID) | ORAL | Status: DC
  Administered 2017-05-17 – 2017-05-25 (×15): 8.6 mg via ORAL
  Filled 2017-05-17 (×16): qty 1

## 2017-05-17 MED ORDER — ALBUMIN HUMAN 5 % IV SOLN
INTRAVENOUS | Status: AC
  Filled 2017-05-17: qty 250

## 2017-05-17 MED ORDER — LIDOCAINE 2% (20 MG/ML) 5 ML SYRINGE
INTRAMUSCULAR | Status: DC | PRN
  Administered 2017-05-17: 100 mg via INTRAVENOUS

## 2017-05-17 MED ORDER — LIDOCAINE HCL 2 % EX GEL
CUTANEOUS | Status: AC
  Filled 2017-05-17: qty 5

## 2017-05-17 MED ORDER — CEFAZOLIN SODIUM-DEXTROSE 2-4 GM/100ML-% IV SOLN
INTRAVENOUS | Status: AC
  Filled 2017-05-17: qty 100

## 2017-05-17 MED ORDER — METOPROLOL TARTRATE 5 MG/5ML IV SOLN
2.5000 mg | Freq: Once | INTRAVENOUS | Status: AC
  Administered 2017-05-17: 2.5 mg via INTRAVENOUS
  Filled 2017-05-17: qty 5

## 2017-05-17 MED ORDER — METOPROLOL TARTRATE 5 MG/5ML IV SOLN
INTRAVENOUS | Status: AC
  Filled 2017-05-17: qty 5

## 2017-05-17 MED ORDER — ALBUMIN HUMAN 5 % IV SOLN
INTRAVENOUS | Status: DC | PRN
  Administered 2017-05-17: 17:00:00 via INTRAVENOUS

## 2017-05-17 MED ORDER — FUROSEMIDE 40 MG PO TABS: 40.0000 mg | ORAL_TABLET | Freq: Every morning | ORAL | Status: DC

## 2017-05-17 MED ORDER — HYDROMORPHONE HCL 1 MG/ML IJ SOLN
0.2500 mg | INTRAMUSCULAR | Status: DC | PRN
  Filled 2017-05-17: qty 0.5

## 2017-05-17 MED ORDER — ONDANSETRON HCL 4 MG/2ML IJ SOLN
INTRAMUSCULAR | Status: AC
  Filled 2017-05-17: qty 2

## 2017-05-17 MED ORDER — DIAZEPAM 5 MG PO TABS
10.0000 mg | ORAL_TABLET | Freq: Four times a day (QID) | ORAL | Status: DC | PRN
  Administered 2017-05-19: 20 mg via ORAL
  Filled 2017-05-17 (×2): qty 2

## 2017-05-17 MED ORDER — SODIUM CHLORIDE 0.9 % IV SOLN
0.0000 ug/min | INTRAVENOUS | Status: DC
  Administered 2017-05-17: 20 ug/min via INTRAVENOUS
  Filled 2017-05-17 (×2): qty 1

## 2017-05-17 MED ORDER — GLYCOPYRROLATE 0.2 MG/ML IV SOSY
PREFILLED_SYRINGE | INTRAVENOUS | Status: AC
  Filled 2017-05-17: qty 5

## 2017-05-17 MED ORDER — PHENYLEPHRINE HCL 10 MG/ML IJ SOLN
INTRAVENOUS | Status: DC | PRN
  Administered 2017-05-17: 50 ug/min via INTRAVENOUS

## 2017-05-17 MED ORDER — CEFAZOLIN SODIUM-DEXTROSE 2-4 GM/100ML-% IV SOLN
2.0000 g | INTRAVENOUS | Status: AC
  Administered 2017-05-17: 2 g via INTRAVENOUS
  Filled 2017-05-17: qty 100

## 2017-05-17 MED ORDER — SODIUM CHLORIDE 0.9 % IV SOLN
Freq: Once | INTRAVENOUS | Status: AC
  Administered 2017-05-17: 17:00:00 via INTRAVENOUS
  Filled 2017-05-17: qty 1000

## 2017-05-17 MED ORDER — PHENYLEPHRINE HCL 10 MG/ML IJ SOLN
INTRAMUSCULAR | Status: AC
  Filled 2017-05-17: qty 1

## 2017-05-17 MED ORDER — BELLADONNA ALKALOIDS-OPIUM 16.2-60 MG RE SUPP: 1.0000 | Freq: Four times a day (QID) | RECTAL | Status: DC | PRN

## 2017-05-17 MED ORDER — PHENYLEPHRINE HCL 10 MG/ML IJ SOLN
INTRAMUSCULAR | Status: DC | PRN
  Administered 2017-05-17: 40 ug via INTRAVENOUS

## 2017-05-17 MED ORDER — PROPOFOL 10 MG/ML IV BOLUS
INTRAVENOUS | Status: DC | PRN
  Administered 2017-05-17 (×3): 50 mg via INTRAVENOUS
  Administered 2017-05-17: 100 mg via INTRAVENOUS
  Administered 2017-05-17: 50 mg via INTRAVENOUS

## 2017-05-17 MED ORDER — SODIUM CHLORIDE 0.9 % IR SOLN
Status: DC | PRN
  Administered 2017-05-17: 24000 mL via INTRAVESICAL

## 2017-05-17 MED ORDER — ESMOLOL HCL 100 MG/10ML IV SOLN
INTRAVENOUS | Status: DC | PRN
  Administered 2017-05-17: 10 mg via INTRAVENOUS

## 2017-05-17 MED ORDER — PHENYLEPHRINE 40 MCG/ML (10ML) SYRINGE FOR IV PUSH (FOR BLOOD PRESSURE SUPPORT)
PREFILLED_SYRINGE | INTRAVENOUS | Status: DC | PRN
  Administered 2017-05-17: 50 ug via INTRAVENOUS

## 2017-05-17 MED ORDER — PHENYLEPHRINE 40 MCG/ML (10ML) SYRINGE FOR IV PUSH (FOR BLOOD PRESSURE SUPPORT)
PREFILLED_SYRINGE | INTRAVENOUS | Status: AC
  Filled 2017-05-17: qty 10

## 2017-05-17 MED ORDER — SUCCINYLCHOLINE CHLORIDE 200 MG/10ML IV SOSY
PREFILLED_SYRINGE | INTRAVENOUS | Status: DC | PRN
  Administered 2017-05-17: 100 mg via INTRAVENOUS

## 2017-05-17 MED ORDER — MEPERIDINE HCL 25 MG/ML IJ SOLN
6.2500 mg | INTRAMUSCULAR | Status: DC | PRN
  Filled 2017-05-17: qty 1

## 2017-05-17 MED ORDER — OXYCODONE HCL 5 MG/5ML PO SOLN
5.0000 mg | Freq: Once | ORAL | Status: DC | PRN
  Filled 2017-05-17: qty 5

## 2017-05-17 MED ORDER — FENTANYL CITRATE (PF) 100 MCG/2ML IJ SOLN
25.0000 ug | INTRAMUSCULAR | Status: DC | PRN
  Administered 2017-05-17: 50 ug via INTRAVENOUS
  Filled 2017-05-17: qty 1

## 2017-05-17 MED ORDER — ONDANSETRON HCL 4 MG/2ML IJ SOLN: 4.0000 mg | INTRAMUSCULAR | Status: DC | PRN

## 2017-05-17 MED ORDER — ESMOLOL HCL 100 MG/10ML IV SOLN
INTRAVENOUS | Status: AC
Start: 2017-05-17 — End: 2017-05-17
  Filled 2017-05-17: qty 10

## 2017-05-17 MED ORDER — SODIUM CHLORIDE 0.9 % IV SOLN
0.0000 ug/min | INTRAVENOUS | Status: DC
  Administered 2017-05-17: 0.4 ug/min via INTRAVENOUS
  Filled 2017-05-17: qty 1

## 2017-05-17 MED ORDER — OXYCODONE HCL 5 MG PO TABS
5.0000 mg | ORAL_TABLET | Freq: Once | ORAL | Status: DC | PRN
  Filled 2017-05-17: qty 1

## 2017-05-17 SURGICAL SUPPLY — 35 items
BAG DRAIN URO-CYSTO SKYTR STRL (DRAIN) ×3 IMPLANT
BAG DRN ANRFLXCHMBR STRAP LEK (BAG)
BAG DRN UROCATH (DRAIN) ×1
BAG URINE DRAINAGE (UROLOGICAL SUPPLIES) ×2 IMPLANT
BAG URINE LEG 19OZ MD ST LTX (BAG) IMPLANT
CATH FOLEY 2WAY SLVR  5CC 20FR (CATHETERS)
CATH FOLEY 2WAY SLVR  5CC 22FR (CATHETERS)
CATH FOLEY 2WAY SLVR 30CC 22FR (CATHETERS) IMPLANT
CATH FOLEY 2WAY SLVR 5CC 20FR (CATHETERS) IMPLANT
CATH FOLEY 2WAY SLVR 5CC 22FR (CATHETERS) IMPLANT
CATH FOLEY 3WAY 30CC 22F (CATHETERS) IMPLANT
CATH HEMA 3WAY 30CC 22FR COUDE (CATHETERS) ×2 IMPLANT
CATH HEMA 3WAY 30CC 24FR COUDE (CATHETERS) IMPLANT
CATH HEMA 3WAY 30CC 24FR RND (CATHETERS) IMPLANT
CLOTH BEACON ORANGE TIMEOUT ST (SAFETY) ×3 IMPLANT
ELECT BUTTON BIOP 24F 90D PLAS (MISCELLANEOUS) IMPLANT
ELECT REM PT RETURN 9FT ADLT (ELECTROSURGICAL) ×3
ELECTRODE REM PT RTRN 9FT ADLT (ELECTROSURGICAL) ×1 IMPLANT
EVACUATOR MICROVAS BLADDER (UROLOGICAL SUPPLIES) ×2 IMPLANT
GLOVE BIO SURGEON STRL SZ8 (GLOVE) ×3 IMPLANT
GOWN STRL REUS W/ TWL LRG LVL3 (GOWN DISPOSABLE) ×1 IMPLANT
GOWN STRL REUS W/ TWL XL LVL3 (GOWN DISPOSABLE) ×1 IMPLANT
GOWN STRL REUS W/TWL LRG LVL3 (GOWN DISPOSABLE) ×3
GOWN STRL REUS W/TWL XL LVL3 (GOWN DISPOSABLE) ×3
HOLDER FOLEY CATH W/STRAP (MISCELLANEOUS) ×2 IMPLANT
KIT RM TURNOVER CYSTO AR (KITS) ×3 IMPLANT
LOOP CUT BIPOLAR 24F LRG (ELECTROSURGICAL) ×2 IMPLANT
MANIFOLD NEPTUNE II (INSTRUMENTS) ×2 IMPLANT
PACK CYSTO (CUSTOM PROCEDURE TRAY) ×3 IMPLANT
PLUG CATH AND CAP STER (CATHETERS) IMPLANT
SET ASPIRATION TUBING (TUBING) IMPLANT
SYR 30ML LL (SYRINGE) IMPLANT
SYRINGE IRR TOOMEY STRL 70CC (SYRINGE) ×2 IMPLANT
TUBE CONNECTING 12'X1/4 (SUCTIONS) ×1
TUBE CONNECTING 12X1/4 (SUCTIONS) ×1 IMPLANT

## 2017-05-17 SURGICAL SUPPLY — 36 items
BAG DRAIN URO-CYSTO SKYTR STRL (DRAIN) ×3 IMPLANT
BAG DRN ANRFLXCHMBR STRAP LEK (BAG)
BAG DRN UROCATH (DRAIN) ×1
BAG URINE DRAINAGE (UROLOGICAL SUPPLIES) ×2 IMPLANT
BAG URINE LEG 19OZ MD ST LTX (BAG) IMPLANT
CATH FOLEY 2WAY SLVR  5CC 20FR (CATHETERS)
CATH FOLEY 2WAY SLVR  5CC 22FR (CATHETERS)
CATH FOLEY 2WAY SLVR 30CC 22FR (CATHETERS) IMPLANT
CATH FOLEY 2WAY SLVR 5CC 20FR (CATHETERS) IMPLANT
CATH FOLEY 2WAY SLVR 5CC 22FR (CATHETERS) IMPLANT
CATH FOLEY 3WAY 30CC 22F (CATHETERS) IMPLANT
CATH HEMA 3WAY 30CC 24FR COUDE (CATHETERS) IMPLANT
CATH HEMA 3WAY 30CC 24FR RND (CATHETERS) ×2 IMPLANT
CLOTH BEACON ORANGE TIMEOUT ST (SAFETY) ×3 IMPLANT
ELECT BIVAP BIPO 22/24 DONUT (ELECTROSURGICAL) ×3
ELECT BUTTON BIOP 24F 90D PLAS (MISCELLANEOUS) IMPLANT
ELECT REM PT RETURN 9FT ADLT (ELECTROSURGICAL) ×3
ELECTRD BIVAP BIPO 22/24 DONUT (ELECTROSURGICAL) IMPLANT
ELECTRODE REM PT RTRN 9FT ADLT (ELECTROSURGICAL) ×1 IMPLANT
EVACUATOR MICROVAS BLADDER (UROLOGICAL SUPPLIES) IMPLANT
GLOVE BIO SURGEON STRL SZ8 (GLOVE) ×3 IMPLANT
GOWN STRL REUS W/ TWL LRG LVL3 (GOWN DISPOSABLE) ×1 IMPLANT
GOWN STRL REUS W/ TWL XL LVL3 (GOWN DISPOSABLE) ×1 IMPLANT
GOWN STRL REUS W/TWL LRG LVL3 (GOWN DISPOSABLE) ×3
GOWN STRL REUS W/TWL XL LVL3 (GOWN DISPOSABLE) ×3
HOLDER FOLEY CATH W/STRAP (MISCELLANEOUS) IMPLANT
KIT RM TURNOVER CYSTO AR (KITS) ×3 IMPLANT
LOOP CUT BIPOLAR 24F LRG (ELECTROSURGICAL) ×2 IMPLANT
MANIFOLD NEPTUNE II (INSTRUMENTS) ×2 IMPLANT
PACK CYSTO (CUSTOM PROCEDURE TRAY) ×3 IMPLANT
PLUG CATH AND CAP STER (CATHETERS) IMPLANT
SET ASPIRATION TUBING (TUBING) IMPLANT
SYR 30ML LL (SYRINGE) ×4 IMPLANT
SYRINGE IRR TOOMEY STRL 70CC (SYRINGE) ×2 IMPLANT
TUBE CONNECTING 12'X1/4 (SUCTIONS) ×1
TUBE CONNECTING 12X1/4 (SUCTIONS) ×1 IMPLANT

## 2017-05-17 NOTE — Anesthesia Postprocedure Evaluation (Signed)
Anesthesia Post Note  Patient: Adrian Ramos  Procedure(s) Performed: Procedure(s) (LRB): EVACUATION HEMATOMA AND           CYSTOSCOPY AND CLOT EVACUATION AND FULGERATION (N/A)     Patient location during evaluation: PACU Anesthesia Type: General Level of consciousness: awake and alert Pain management: pain level controlled Vital Signs Assessment: post-procedure vital signs reviewed and stable Respiratory status: spontaneous breathing, nonlabored ventilation, respiratory function stable and patient connected to nasal cannula oxygen Cardiovascular status: blood pressure returned to baseline and stable Postop Assessment: no signs of nausea or vomiting Anesthetic complications: no Comments: To stepdown overnight. Neo drip will be titrated as he receives second unit of red cells    Last Vitals:  Vitals:   05/17/17 1844 05/17/17 1845  BP:    Pulse: 69 68  Resp: 15 16  Temp:      Last Pain:  Vitals:   05/17/17 1400  TempSrc:   PainSc: 4                  Arris Meyn S

## 2017-05-17 NOTE — Anesthesia Procedure Notes (Signed)
Procedure Name: Intubation Date/Time: 05/17/2017 4:44 PM Performed by: Wanita Chamberlain Pre-anesthesia Checklist: Patient identified, Emergency Drugs available, Suction available, Patient being monitored and Timeout performed Patient Re-evaluated:Patient Re-evaluated prior to inductionOxygen Delivery Method: Circle system utilized Preoxygenation: Pre-oxygenation with 100% oxygen Intubation Type: IV induction Ventilation: Mask ventilation without difficulty Laryngoscope Size: Mac and 3 Grade View: Grade I Tube type: Oral Tube size: 7.0 mm Number of attempts: 1 Placement Confirmation: ETT inserted through vocal cords under direct vision Secured at: 21 cm Tube secured with: Tape Dental Injury: Teeth and Oropharynx as per pre-operative assessment

## 2017-05-17 NOTE — Transfer of Care (Signed)
Immediate Anesthesia Transfer of Care Note  Patient: Adrian Ramos  Procedure(s) Performed: Procedure(s) (LRB): TRANSURETHRAL RESECTION OF THE PROSTATE (TURP) (N/A)  Patient Location: PACU  Anesthesia Type: General  Level of Consciousness: awake, sedated, patient cooperative and responds to stimulation  Airway & Oxygen Therapy: Patient Spontanous Breathing and Patient connected to [place] O2  Post-op Assessment: Report given to PACU RN, Post -op Vital signs reviewed and stable and Patient moving all extremities  Post vital signs: Reviewed and stable  Complications: No apparent anesthesia complications

## 2017-05-17 NOTE — Op Note (Signed)
Preoperative diagnosis: 1. Bladder outlet obstruction secondary to BPH  Postoperative diagnosis:  1. Bladder outlet obstruction secondary to BPH  Procedure:  1. Cystoscopy 2. Transurethral resection of the prostate  Surgeon: Bertram MillardStephen M. Edward Trevino, M.D.  Anesthesia: General  Complications: None  Drain: Foley catheter  EBL: Minimal  Specimens: 1. Prostate chips  Disposition of specimens: Pathology  Indication: [name] is a patient with bladder outlet obstruction secondary to benign prostatic hyperplasia. After reviewing the management options for treatment, he elected to proceed with the above surgical procedure(s). We have discussed the potential benefits and risks of the procedure, side effects of the proposed treatment, the likelihood of the patient achieving the goals of the procedure, and any potential problems that might occur during the procedure or recuperation. Informed consent has been obtained.  Description of procedure:  The patient was identified in the holding area.He received preoperative antibiotics. He was then taken to the operating room. General anesthetic was administered.  The patient was then placed in the dorsal lithotomy position, prepped and draped in the usual sterile fashion. Timeout was then performed.  A resectoscope sheath was placed using the obturator, and the resectoscope, loop and telescope were placed.  The bladder was then systematically examined in its entirety. There was no evidence of  tumors, stones, or other mucosal pathology.  The ureteral orifices were identified and marked so as to be avoided during the procedure.  The prostate adenoma was then resected utilizing loop cautery resection with the bipolar cutting loop.  The prostate adenoma from the bladder neck back to the verumontanum was resected beginning at the six o'clock position and then extended to include the right and left lobes of the prostate and anterior prostate,  respectively. Care was taken not to resect distal to the verumontanum.  Hemostasis was then achieved with the cautery and the bladder was emptied and reinspected with no significant bleeding noted at the end of the procedure.  Resected chips were irrigated from the bladder with the evacuator and sent to pathology.  A 3 way catheter was then placed into the bladder and placed on continuous bladder irrigation.  The patient appeared to tolerate the procedure well and without complications. The patient was able to be awakened and transferred to the recovery unit in satisfactory condition. He tolerated the procedure well.

## 2017-05-17 NOTE — Anesthesia Procedure Notes (Signed)
Procedure Name: LMA Insertion Date/Time: 05/17/2017 10:59 AM Performed by: Jessica PriestBEESON, Marios Gaiser C Pre-anesthesia Checklist: Patient identified, Emergency Drugs available, Suction available and Patient being monitored Patient Re-evaluated:Patient Re-evaluated prior to inductionOxygen Delivery Method: Circle system utilized Preoxygenation: Pre-oxygenation with 100% oxygen Intubation Type: IV induction Ventilation: Mask ventilation without difficulty LMA: LMA inserted LMA Size: 4.0 Number of attempts: 1 Airway Equipment and Method: Bite block Placement Confirmation: positive ETCO2 and breath sounds checked- equal and bilateral Tube secured with: Tape Dental Injury: Teeth and Oropharynx as per pre-operative assessment  Comments: Poor discolored dental habits/ teeth  Missing / existing teeth in bad condition  Noted pre - op nasal area red with scabbed areas.

## 2017-05-17 NOTE — Consult Note (Signed)
Name: Adrian Ramos MRN: [id number] DOB: 08-Feb-1946    ADMISSION DATE:  05/17/2017 CONSULTATION DATE:  05/17/2017  REFERRING MD :  Dr. Retta Diones Urology  CHIEF COMPLAINT:  Hypotension post TURP   HISTORY OF PRESENT ILLNESS:  71 year old male with PMH as below, which is significant for PAF (on ASA only), and CHF (likely due to AF or ETOH abuse, EF now recovered as he has largely stopped drinking). He has recently complained of urinary retention, which was discovered to be the result of an enlarged prostate. He presented for elective TURP 5/31. Post-operatively he had significant bleeding from the urethra and needed to be taken back to have cautery performed. After the second procedure his hemoglobin had fallen from 13 initially to 8.9 and he was hypotensive. He was ordered 2 units of blood and phenylephrine for BP support. Due to need for pressors he was transferred to ICU and PCCM was asked to assist in his care. He denies chest pain, dyspnea, and dizziness.   SIGNIFICANT EVENTS  5/31 bleeding after TURP requiring blood products and pressors. Admit to ICU  STUDIES:    PAST MEDICAL HISTORY :   has a past medical history of Alcohol abuse; Anxiety; BPH (benign prostatic hyperplasia); Cardiomyopathy (HCC); Chronic back pain; Complication of anesthesia (2007); Coronary artery disease (CARDIOLOGIST-  DR TIFFANY [place]); Foley catheter in place; GERD (gastroesophageal reflux disease); History of urinary retention (2007); Hypertension; IDA (iron deficiency anemia); Insomnia; PAF (paroxysmal atrial fibrillation) (HCC) (CARDIOLOGIST-  DR TIFFANY [place]); Pulmonary nodule, left (10/16/2016); RBBB (right bundle branch block); Recurrent right inguinal hernia; Systolic and diastolic CHF, chronic (HCC); and Urinary retention.  has a past surgical history that includes REPAIR RECURRENT RIGHT INGUINAL HERNIA (12-03-2006); BENIGN PENILE GROWTH REMOVED (1985); Knee arthroscopy (03/13/2012); Cataract extraction  w/ intraocular lens  implant, bilateral (Bilateral, 06/2015-07/2015); Cardioversion (N/A, 10/12/2016); TEE without cardioversion (N/A, 10/12/2016); Cardiac catheterization (2000); Inguinal hernia repair (Right, 07-19-2006); transthoracic echocardiogram (03/05/2017); and Cardiovascular stress test (10-15-2016  dr Chilton Si). Prior to Admission medications   Medication Sig Start Date End Date Taking? Authorizing Provider  amiodarone (PACERONE) 100 MG tablet Take 1 tablet (100 mg total) by mouth daily. Patient taking differently: Take 100 mg by mouth every morning. Per pt has his pharmacy dispense 200mg  halfed to equal 100mg  (cheaper) 02/05/17  Yes Chilton Si, MD  Ascorbic Acid (VITAMIN C) 1000 MG tablet Take 1,000 mg by mouth daily.   Yes [provider]  aspirin EC 81 MG tablet Take 81 mg by mouth daily.   Yes [provider]  atorvastatin (LIPITOR) 40 MG tablet Take 1 tablet (40 mg total) by mouth daily at 6 PM. Patient taking differently: Take 40 mg by mouth every evening.  10/16/16  Yes Barrett, Joline Salt, PA-C  chlorhexidine (PERIDEX) 0.12 % solution 10 mLs by Mouth Rinse route 2 (two) times daily.  09/22/16  Yes [provider]  Cholecalciferol (VITAMIN D3 PO) Take 1 tablet by mouth daily with supper.   Yes [provider]  Clotrimazole 1 % OINT Apply topically as needed.    Yes [provider]  Coenzyme Q10 (COQ-10 PO) Take 1 capsule by mouth every morning.    Yes [provider]  Cyanocobalamin (VITAMIN B-12 PO) Take by mouth. Take one 500mg  tab daily   Yes [provider]  diazepam (VALIUM) 10 MG tablet Take 10-20 mg by mouth 4 (four) times daily as needed for anxiety.    Yes [provider]  docusate sodium (  COLACE) 100 MG capsule Take 100 mg by mouth daily.   Yes [provider]  finasteride (PROSCAR) 5 MG tablet Take 5 mg by mouth every evening.   Yes [provider]  furosemide (LASIX) 40 MG  tablet Take 1 tablet by mouth every morning.  11/13/16  Yes [provider]  losartan (COZAAR) 25 MG tablet Take 1 tablet (25 mg total) by mouth daily. Patient taking differently: Take 25 mg by mouth every morning.  10/17/16  Yes Barrett, Joline Salt, PA-C  MILK THISTLE PO Take 1 tablet by mouth daily with supper.   Yes [provider]  Misc Natural Products (PROSTATE HEALTH) CAPS Take by mouth daily.   Yes [provider]  Omega-3 Fatty Acids (OMEGA 3 PO) Take 1 capsule by mouth daily with supper.   Yes [provider]  OVER THE COUNTER MEDICATION HMF Forte (Probiotic Supplement) daily   Yes [provider]  ranitidine (ZANTAC) 150 MG tablet TAKE 1 TABLET (150 MG TOTAL) BY MOUTH 2 (TWO) TIMES DAILY. 05/07/17  Yes Armbruster, Reeves Forth, MD  traZODone (DESYREL) 100 MG tablet Take 200 mg by mouth at bedtime.    Yes [provider]  VITAMIN A PO Take 1 tablet by mouth daily.   Yes [provider]  vitamin E 400 UNIT capsule Take 400 Units by mouth daily.    Yes [provider]   Allergies  Allergen Reactions  . Iodinated Diagnostic Agents Hives and Other (See Comments)    Unknown if MRI or CT was used for pelvis scan? different contrasts between them Developed hives 1 hour after scanning needs 13 hour pre med prior to scan  . Xarelto [Rivaroxaban]     FAMILY HISTORY:  family history includes Anxiety disorder in his father; Cerebrovascular Accident in his mother; Depression in his father; Heart disease in his father; Hodgkin's lymphoma in his mother; Hypertension in his father. SOCIAL HISTORY:  reports that he quit smoking about 17 months ago. His smoking use included Cigars. He quit after 25.00 years of use. He quit smokeless tobacco use about 27 years ago. His smokeless tobacco use included Chew. He reports that he drinks alcohol. He reports that he does not use drugs.  REVIEW OF SYSTEMS:    Bolds are positive    Constitutional: weight loss, gain, night sweats, Fevers, chills, fatigue .  HEENT: headaches, Sore throat, sneezing, nasal congestion, post nasal drip, Difficulty swallowing, Tooth/dental problems, visual complaints visual changes, ear ache CV:  chest pain, radiates:,Orthopnea, PND, swelling in lower extremities, dizziness, palpitations, syncope.  GI  heartburn, indigestion, abdominal pain, nausea, vomiting, diarrhea, change in bowel habits, loss of appetite, bloody stools.  Resp: cough, productive:, hemoptysis, dyspnea, chest pain, pleuritic.  Skin: rash or itching or icterus GU: dysuria, change in color of urine, urgency or frequency. flank pain, hematuria  MS: joint pain or swelling. decreased range of motion  Psych: change in mood or affect. depression or anxiety.  Neuro: difficulty with speech, weakness, numbness, ataxia   SUBJECTIVE:   VITAL SIGNS: Temp:  [97 F (36.1 C)-98.1 F (36.7 C)] 97 F (36.1 C) (05/31 1930) Pulse Rate:  [51-133] 77 (05/31 2110) Resp:  [10-27] 15 (05/31 2110) BP: (78-139)/(46-80) 82/50 (05/31 2110) SpO2:  [96 %-100 %] 100 % (05/31 2110) Weight:  [83.5 kg (184 lb)] 83.5 kg (184 lb) (05/31 0903)  PHYSICAL EXAMINATION: General:  Overweight male in NAD Neuro:  Alert, oriented, non-focal HEENT:  [place]/AT, PERRL, No JVD Cardiovascular:  RRR,  no MRG. Intermittently brady on tele review into 32s, but regular.  Lungs:  Clear, no distress.  Abdomen:  Soft, non-tender, non-distended Musculoskeletal:  No acute deformity or ROM limitation Skin:  Grossly intact   Recent Labs Lab 05/17/17 1030  NA 137  K 4.4  CL 100*  BUN 20  CREATININE 0.80  GLUCOSE 121*    Recent Labs Lab 05/17/17 1030 05/17/17 1527 05/17/17 1537  HGB 12.6* 9.2* 8.9*  HCT 37.0*  --  25.9*  WBC  --   --  8.9  PLT  --   --  142*   No results found.  ASSESSMENT / PLAN:  71 year old male with hypotension related to hemorrhage s/p TURP procedure. Receiving 2 units PRBC currently  on and phenylephrine infusion. No complaints and reports that his BP typically runs in 90s-100s albeit on BP mends.   Hypotension secondary to ABLA s/p TURP - Continue 2 unit PRBC transfusion - CBC check q 6 hours.  - Assess coagulation studies - IVF hydration with NS @ 27mL/Hr - Phenylephrine titrated to MAP goal >  Urinary retention secondary to BPH now s/p TURP - Management per Urology - CBI - Bactrim  PAF - Telemetry monitoring in ICU setting - Holding ASA - Continue home amiodarone  CHF (EF normalized as of 3/19, followed by Dr. Duke Salvia) - Holding home lasix, losartan  Pain management - Oxy IR per urology  Anxiety/Insomnia - Holding home Valium   Joneen Roach, AGACNP-BC [facility] Pulmonology/Critical Care Pager [phone] or [phone]  05/17/2017 10:04 PM   ATTENDING NOTE / ATTESTATION NOTE :   I have discussed the case with the resident/APP  Joneen Roach NP  I agree with the resident/APP's  history, physical examination, assessment, and plans.    I have edited the above note and modified it according to our agreed history, physical examination, assessment and plan.   Briefly, 71 year old male with h/o  PAF (on ASA only), and CHF (likely due to AF or ETOH abuse, EF now recovered as he has largely stopped drinking), admitted for urinary retention, which was discovered to be the result of an enlarged prostate. He presented for elective TURP 5/31. Post-operatively he had significant bleeding from the urethra and needed to be taken back to have cautery performed. After the second procedure his hemoglobin had fallen from 13 initially to 8.9 and he was hypotensive. He was ordered 2 units of blood and phenylephrine for BP support. Due to need for pressors he was transferred to ICU and PCCM was asked to assist in his care. He denies chest pain, dyspnea, and dizziness.   No issues overnight. Comfortable. (-) SOB, cp, dizziness. On neosynpehrine at 150  mcg/kg/min Getting his 3rd u pRBC Tolerating diet   Vitals:  Vitals:   05/18/17 0700 05/18/17 0800 05/18/17 0900 05/18/17 0912  BP: 100/60 (!) 118/36 (!) 101/31   Pulse: (!) 56 (!) 47 (!) 54 (!) 49  Resp: 15 16 18 18   Temp:  97.9 F (36.6 C) 98.9 F (37.2 C)   TempSrc:  Oral Oral   SpO2: 99% 99% 98% 99%  Weight:      Height:        Constitutional/General: well-nourished, well-developed, not in any distress  Body mass index is 33.19 kg/m. Wt Readings from Last 3 Encounters:  05/17/17 85 kg (187 lb 6.3 oz)  05/07/17 84.8 kg (187 lb)  01/22/17 93 kg (205 lb)    HEENT: PERLA, anicteric sclerae. (-)  Oral thrush.  Neck: No masses. Midline trachea. No JVD, (-) LAD. (-) bruits appreciated.  Respiratory/Chest: Grossly normal chest. (-) deformity. (-) Accessory muscle use.  Symmetric expansion. Diminished BS on both lower lung zones. (-) wheezing, rhonchi Some crackles at bases (-) egophony  Cardiovascular: Regular rate and  rhythm, heart sounds normal, no murmur or gallops,  Trace peripheral edema  Gastrointestinal:  Normal bowel sounds. Soft, non-tender. No hepatosplenomegaly.  (-) masses.   Musculoskeletal:  Normal muscle tone.   Extremities: Grossly normal. (-) clubbing, cyanosis.  Trace  edema  Skin: (-) rash,lesions seen.   Neurological/Psychiatric :CN grossly intact. (-) lateralizing signs.     CBC Recent Labs     05/17/17  1537  05/17/17  2315  05/18/17  0533  WBC  8.9  8.5  9.5  HGB  8.9*  8.4*  7.7*  HCT  25.9*  24.3*  21.5*  PLT  142*  102*  105*    Coag's Recent Labs     05/17/17  2315  INR  1.29    BMET Recent Labs     05/17/17  1030  05/17/17  2315  NA  137  136  K  4.4  5.0  CL  100*  108  CO2   --   24  BUN  20  16  CREATININE  0.80  0.76  GLUCOSE  121*  155*    Electrolytes Recent Labs     05/17/17  2315  CALCIUM  7.6*    Sepsis Markers No results for input(s): PROCALCITON, O2SATVEN in the last 72  hours.  Invalid input(s): LACTICACIDVEN  ABG No results for input(s): PHART, PCO2ART, PO2ART in the last 72 hours.  Liver Enzymes No results for input(s): AST, ALT, ALKPHOS, BILITOT, ALBUMIN in the last 72 hours.  Cardiac Enzymes Recent Labs     05/17/17  2315  05/18/17  0533  TROPONINI  <0.03  <0.03    Glucose No results for input(s): GLUCAP in the last 72 hours.  Imaging No results found.   Assessment/Plan :  Hypotension secondary to ABLA s/p TURP + Hypovolemia. Doubt sepsis or cardiogenic shock as source of hypotension - Pt's BP has been running low as an oupt.  Cards has been weaning/cutting down his cardiac meds.  His BP usually runs 90-100/50-60.  - If OK with Cards, goal BP is 90-100/50-60 as long as pt is mentating well and making urine. - He is on Neosynpehrine at 150 mcg/kg/min peripherally and I hope we can taper this off with his 3rd u pRBC and being more lenient with our BP goals - on his 3rd upRBC. No obvious source of ongoing bleeding.  Will get Hb and Hct at  1pm then at 7pm.  - he is tolerating diet.  Will switch IVF to LR at 50 mls/hr. Plan to d/c in am if BP is better.    Urinary retention secondary to BPH now s/p TURP - Management per Urology - CBI - Bactrim   PAF - Telemetry monitoring in ICU setting - Holding ASA - Continue home amiodarone once BP is better.    CHF (EF normalized as of 3/19, followed by Dr. Duke Salviaandolph) - Holding home lasix, losartan   Pain management - Oxy IR per urology   Anxiety/Insomnia - Suggest judicious use of benzos.    I spent  30  minutes of Critical Care time with this patient today. This is my time spent independent of the APP or resident.  Family :  No family at bedside. Patient updated of plans at bedside.    Pollie Meyer, MD 05/18/2017, 9:23 AM [place] [facility] Pager [phone] After 3 pm or if no answer, call [phone]

## 2017-05-17 NOTE — Op Note (Signed)
Preoperative diagnosis: Status post TURP with significant bleeding postoperatively.  Postoperative diagnosis: Same  Principal procedure: Cystoscopy, clot evacuation, cauterization of prostatic fossa,  Surgeon: Angala Hilgers  Anesthesia: Gen. endotracheal  Complications: Hypotension, anemia, eventual need for transfusion.  Specimen: None  Indications: 71 year old male status post TURP earlier today.  His had significant bleeding in the recovery room, necessitating catheter change and irrigation.  The patient is developing anemia from this, hypotension, and it is my view that he needs to have a repeat anesthetic procedure for clot evacuation, fulguration of bleeders.  I discussed this with the patient and he agrees to proceed.  Description of procedure: The patient was taken back to the operating room where general endotracheal anesthetic was administered.  He was placed in the dorsolithotomy position.  Genitalia and perineum were prepped and draped.  Timeout was performed.  We had removed his catheter.  The resectoscope sheath was placed/past with visual obturator.  The bladder was entered.  The bladder was irrigated from of clot which was approximately 200 mL worth.  Additionally, clot within the prostatic fossa was irrigated free.  I inspected the prostatic fossa.  First, I used the TUR button to try to cauterize small areas per this was not effective, and the cutting loop was then placed.  There was only one significant small bleeder seen posteriorly on the right upper prostatic fossa.  This was cauterized.  The remaining prostatic fossa was then cauterized using the loop as well.  No significant bleeding was seen.  Following this, and seeing that the irrigant/if fluid was clear, the resectoscope was removed.  I placed a 24 [place] three-way hematuria catheter.  45 mL of water was placed in the balloon.  This was placed to gentle traction and irrigation was running clear at the time of completion the  procedure.  The patient was then awakened and taken to PACU in stable condition.  The patient will be admitted to the stepdown/intensive care unit for medical management of his baseline medical conditions including CHF, as well as his anemia.  The patient started to have 2 units of blood transfused the time of his transfer to the recovery room.

## 2017-05-17 NOTE — Anesthesia Postprocedure Evaluation (Signed)
Anesthesia Post Note  Patient: Adrian Ramos  Procedure(s) Performed: Procedure(s) (LRB): TRANSURETHRAL RESECTION OF THE PROSTATE (TURP) (N/A)     Patient location during evaluation: PACU Anesthesia Type: General Level of consciousness: awake and alert Pain management: pain level controlled Vital Signs Assessment: post-procedure vital signs reviewed and stable Respiratory status: spontaneous breathing, nonlabored ventilation, respiratory function stable and patient connected to nasal cannula oxygen Cardiovascular status: blood pressure returned to baseline and stable Postop Assessment: no signs of nausea or vomiting Anesthetic complications: no Comments: Afib w/ RVR upon extubation HR >130's. Admin esmolol in OR. Confirmed Afib w/ RVR by EKG. Admin metoprolol in PACU, BP stable, mentation good, HR currently 90's. Spoke with Dr. Duke Salviaandolph, she will have her partner see patient. Ok to transfer to 4th floor with telemetry. Patient is aware and agreeable to plan.     Last Vitals:  Vitals:   05/17/17 1245 05/17/17 1300  BP:  (!) 107/54  Pulse: (!) 112 78  Resp:  11  Temp:      Last Pain:  Vitals:   05/17/17 0903  TempSrc: Oral                 Shelton SilvasKevin D Jamyron Redd

## 2017-05-17 NOTE — Anesthesia Procedure Notes (Signed)
Performed by: Jendaya Gossett F       

## 2017-05-17 NOTE — Anesthesia Preprocedure Evaluation (Signed)
Anesthesia Evaluation  Patient identified by MRN, date of birth, ID band Patient awake    Reviewed: Allergy & Precautions, NPO status , Patient's Chart, lab work & pertinent test results  Airway Mallampati: II  TM Distance: >3 FB Neck ROM: Full    Dental no notable dental hx.    Pulmonary neg pulmonary ROS, former smoker,    Pulmonary exam normal breath sounds clear to auscultation       Cardiovascular hypertension, + CAD and +CHF  + dysrhythmias Atrial Fibrillation  Rhythm:Irregular Rate:Normal     Neuro/Psych negative neurological ROS  negative psych ROS   GI/Hepatic negative GI ROS, (+)     substance abuse  alcohol use,   Endo/Other  negative endocrine ROS  Renal/GU negative Renal ROS  negative genitourinary   Musculoskeletal negative musculoskeletal ROS (+)   Abdominal   Peds negative pediatric ROS (+)  Hematology  (+) anemia ,   Anesthesia Other Findings   Reproductive/Obstetrics negative OB ROS                             Anesthesia Physical Anesthesia Plan  ASA: III and emergent  Anesthesia Plan: General   Post-op Pain Management:    Induction: Intravenous  Airway Management Planned: Oral ETT  Additional Equipment:   Intra-op Plan:   Post-operative Plan: Extubation in OR  Informed Consent: I have reviewed the patients History and Physical, chart, labs and discussed the procedure including the risks, benefits and alternatives for the proposed anesthesia with the patient or authorized representative who has indicated his/her understanding and acceptance.   Dental advisory given  Plan Discussed with: CRNA and Surgeon  Anesthesia Plan Comments: (Bring back from earlier today because of bleeding. Patient developed afib with RVR on emergence earlier today Cards consulted, to stepdown post procedure)        Anesthesia Quick Evaluation

## 2017-05-17 NOTE — Transfer of Care (Signed)
Immediate Anesthesia Transfer of Care Note  Patient: Adrian Ramos  Procedure(s) Performed: Procedure(s): EVACUATION HEMATOMA AND           CYSTOSCOPY AND CLOT EVACUATION AND FULGERATION (N/A)  Patient Location: PACU  Anesthesia Type:General  Level of Consciousness: awake, alert , oriented and patient cooperative  Airway & Oxygen Therapy: Patient Spontanous Breathing and Patient connected to nasal cannula oxygen  Post-op Assessment: Report given to RN and Post -op Vital signs reviewed and stable  Post vital signs: Reviewed and stable  Last Vitals:  Vitals:   05/17/17 1750 05/17/17 1800  BP: 127/63 109/63  Pulse: 73 70  Resp: 13 12  Temp:      Last Pain:  Vitals:   05/17/17 1400  TempSrc:   PainSc: 4       Patients Stated Pain Goal: 5 (05/17/17 0931)  Complications: No apparent anesthesia complications

## 2017-05-17 NOTE — Anesthesia Preprocedure Evaluation (Addendum)
Anesthesia Evaluation  Patient identified by MRN, date of birth, ID band Patient awake    Reviewed: Allergy & Precautions, NPO status , Patient's Chart, lab work & pertinent test results  Airway Mallampati: I  TM Distance: >3 FB Neck ROM: Full    Dental  (+) Poor Dentition   Pulmonary former smoker,    breath sounds clear to auscultation       Cardiovascular hypertension, Pt. on medications + CAD and +CHF  + dysrhythmias  Rhythm:Regular Rate:Normal     Neuro/Psych PSYCHIATRIC DISORDERS Anxiety negative neurological ROS     GI/Hepatic Neg liver ROS, GERD  Medicated,  Endo/Other  negative endocrine ROS  Renal/GU negative Renal ROS  negative genitourinary   Musculoskeletal negative musculoskeletal ROS (+)   Abdominal   Peds negative pediatric ROS (+)  Hematology negative hematology ROS (+)   Anesthesia Other Findings   Reproductive/Obstetrics negative OB ROS                            Lab Results  Component Value Date   WBC  01/18/2017    5.2 abnormal cells  noted on smear referred for pathology review.   HGB 12.6 (L) 05/17/2017   HCT 37.0 (L) 05/17/2017   MCV 91.9 01/18/2017   PLT 139.0 (L) 01/18/2017   Lab Results  Component Value Date   CREATININE 0.80 05/17/2017   BUN 20 05/17/2017   NA 137 05/17/2017   K 4.4 05/17/2017   CL 100 (L) 05/17/2017   CO2 30 11/27/2016   No results found for: INR, PROTIME  2018 EKG: sinus bradycardia, RBBB.  Echo: - Left ventricle: The cavity size was normal. Wall thickness was   normal. Systolic function was normal. The estimated ejection   fraction was in the range of 55% to 60%. Wall motion was normal;   there were no regional wall motion abnormalities. Left   ventricular diastolic function parameters were normal. - Left atrium: The atrium was mildly dilated. - Atrial septum: No defect or patent foramen ovale was identified. - Pulmonary  arteries: Systolic pressure was mildly increased. PA   peak pressure: 36 mm Hg (S).   Anesthesia Physical Anesthesia Plan  ASA: III  Anesthesia Plan: General   Post-op Pain Management:    Induction: Intravenous  Airway Management Planned: LMA  Additional Equipment:   Intra-op Plan:   Post-operative Plan: Extubation in OR  Informed Consent: I have reviewed the patients History and Physical, chart, labs and discussed the procedure including the risks, benefits and alternatives for the proposed anesthesia with the patient or authorized representative who has indicated his/her understanding and acceptance.     Plan Discussed with: CRNA  Anesthesia Plan Comments:         Anesthesia Quick Evaluation

## 2017-05-17 NOTE — Progress Notes (Signed)
eLink Physician-Brief Progress Note Patient Name: Adrian ChaletMark I Ramos DOB: 04/18/1946 MRN: [id number]   Date of Service  05/17/2017  HPI/Events of Note  Admission after transurethral resection of prostate with postop hypotension requiring low-dose Neo-Synephrine. Patient denies any chest pain. Does report some soreness to his throat as well as soreness in his genitals. Appears stable on room air. Brief atrial fibrillation during procedure.   eICU Interventions  1. Adjusted order for Neo-Synephrine titration 2. Checking troponin I 3. Discontinuing home Lasix 4. Continuing home antihypertensive regimen 5. Nurse practitioner for critical care service to assess at bedside      Intervention Category Evaluation Type: New Patient Evaluation  Lawanda CousinsJennings Reynald Woods 05/17/2017, 9:15 PM

## 2017-05-18 ENCOUNTER — Ambulatory Visit (HOSPITAL_COMMUNITY): Payer: Medicare Other

## 2017-05-18 ENCOUNTER — Encounter (HOSPITAL_BASED_OUTPATIENT_CLINIC_OR_DEPARTMENT_OTHER): Payer: Self-pay | Admitting: Urology

## 2017-05-18 DIAGNOSIS — R6520 Severe sepsis without septic shock: Secondary | ICD-10-CM | POA: Diagnosis not present

## 2017-05-18 DIAGNOSIS — I10 Essential (primary) hypertension: Secondary | ICD-10-CM | POA: Diagnosis not present

## 2017-05-18 DIAGNOSIS — I4891 Unspecified atrial fibrillation: Secondary | ICD-10-CM | POA: Diagnosis not present

## 2017-05-18 DIAGNOSIS — N401 Enlarged prostate with lower urinary tract symptoms: Secondary | ICD-10-CM | POA: Diagnosis present

## 2017-05-18 DIAGNOSIS — E876 Hypokalemia: Secondary | ICD-10-CM | POA: Diagnosis not present

## 2017-05-18 DIAGNOSIS — I480 Paroxysmal atrial fibrillation: Secondary | ICD-10-CM

## 2017-05-18 DIAGNOSIS — R579 Shock, unspecified: Secondary | ICD-10-CM | POA: Diagnosis not present

## 2017-05-18 DIAGNOSIS — D62 Acute posthemorrhagic anemia: Secondary | ICD-10-CM | POA: Diagnosis present

## 2017-05-18 DIAGNOSIS — A419 Sepsis, unspecified organism: Secondary | ICD-10-CM | POA: Diagnosis not present

## 2017-05-18 DIAGNOSIS — I9581 Postprocedural hypotension: Secondary | ICD-10-CM | POA: Diagnosis present

## 2017-05-18 DIAGNOSIS — J189 Pneumonia, unspecified organism: Secondary | ICD-10-CM | POA: Diagnosis not present

## 2017-05-18 DIAGNOSIS — I4510 Unspecified right bundle-branch block: Secondary | ICD-10-CM | POA: Diagnosis present

## 2017-05-18 DIAGNOSIS — I5043 Acute on chronic combined systolic (congestive) and diastolic (congestive) heart failure: Secondary | ICD-10-CM | POA: Diagnosis not present

## 2017-05-18 DIAGNOSIS — G4700 Insomnia, unspecified: Secondary | ICD-10-CM | POA: Diagnosis present

## 2017-05-18 DIAGNOSIS — I429 Cardiomyopathy, unspecified: Secondary | ICD-10-CM | POA: Diagnosis present

## 2017-05-18 DIAGNOSIS — J9601 Acute respiratory failure with hypoxia: Secondary | ICD-10-CM | POA: Diagnosis not present

## 2017-05-18 DIAGNOSIS — Y95 Nosocomial condition: Secondary | ICD-10-CM | POA: Diagnosis not present

## 2017-05-18 DIAGNOSIS — R338 Other retention of urine: Secondary | ICD-10-CM | POA: Diagnosis present

## 2017-05-18 DIAGNOSIS — F1010 Alcohol abuse, uncomplicated: Secondary | ICD-10-CM | POA: Diagnosis present

## 2017-05-18 DIAGNOSIS — K219 Gastro-esophageal reflux disease without esophagitis: Secondary | ICD-10-CM | POA: Diagnosis present

## 2017-05-18 DIAGNOSIS — N400 Enlarged prostate without lower urinary tract symptoms: Secondary | ICD-10-CM | POA: Diagnosis present

## 2017-05-18 DIAGNOSIS — I110 Hypertensive heart disease with heart failure: Secondary | ICD-10-CM | POA: Diagnosis present

## 2017-05-18 DIAGNOSIS — T50905A Adverse effect of unspecified drugs, medicaments and biological substances, initial encounter: Secondary | ICD-10-CM | POA: Diagnosis not present

## 2017-05-18 DIAGNOSIS — N99820 Postprocedural hemorrhage and hematoma of a genitourinary system organ or structure following a genitourinary system procedure: Secondary | ICD-10-CM | POA: Diagnosis present

## 2017-05-18 DIAGNOSIS — J690 Pneumonitis due to inhalation of food and vomit: Secondary | ICD-10-CM | POA: Diagnosis not present

## 2017-05-18 DIAGNOSIS — N138 Other obstructive and reflux uropathy: Secondary | ICD-10-CM | POA: Diagnosis present

## 2017-05-18 DIAGNOSIS — R001 Bradycardia, unspecified: Secondary | ICD-10-CM | POA: Diagnosis not present

## 2017-05-18 DIAGNOSIS — F419 Anxiety disorder, unspecified: Secondary | ICD-10-CM | POA: Diagnosis present

## 2017-05-18 DIAGNOSIS — I2510 Atherosclerotic heart disease of native coronary artery without angina pectoris: Secondary | ICD-10-CM | POA: Diagnosis not present

## 2017-05-18 DIAGNOSIS — J441 Chronic obstructive pulmonary disease with (acute) exacerbation: Secondary | ICD-10-CM | POA: Diagnosis not present

## 2017-05-18 LAB — CBC
HCT: 24 % — ABNORMAL LOW (ref 39.0–52.0)
HEMATOCRIT: 24.3 % — AB (ref 39.0–52.0)
HEMATOCRIT: 21.5 % — AB (ref 39.0–52.0)
HEMOGLOBIN: 8.4 g/dL — AB (ref 13.0–17.0)
HEMOGLOBIN: 8.6 g/dL — AB (ref 13.0–17.0)
Hemoglobin: 7.7 g/dL — ABNORMAL LOW (ref 13.0–17.0)
MCH: 31.2 pg (ref 26.0–34.0)
MCH: 32.1 pg (ref 26.0–34.0)
MCH: 32.1 pg (ref 26.0–34.0)
MCHC: 34.6 g/dL (ref 30.0–36.0)
MCHC: 35.8 g/dL (ref 30.0–36.0)
MCHC: 35.8 g/dL (ref 30.0–36.0)
MCV: 90.3 fL (ref 78.0–100.0)
MCV: 89.6 fL (ref 78.0–100.0)
MCV: 89.6 fL (ref 78.0–100.0)
PLATELETS: 102 10*3/uL — AB (ref 150–400)
Platelets: 105 10*3/uL — ABNORMAL LOW (ref 150–400)
Platelets: 115 10*3/uL — ABNORMAL LOW (ref 150–400)
RBC: 2.69 MIL/uL — AB (ref 4.22–5.81)
RBC: 2.4 MIL/uL — ABNORMAL LOW (ref 4.22–5.81)
RBC: 2.68 MIL/uL — AB (ref 4.22–5.81)
RDW: 14.9 % (ref 11.5–15.5)
RDW: 15 % (ref 11.5–15.5)
RDW: 15.2 % (ref 11.5–15.5)
WBC: 8.5 10*3/uL (ref 4.0–10.5)
WBC: 9.5 10*3/uL (ref 4.0–10.5)
WBC: 12.1 10*3/uL — AB (ref 4.0–10.5)

## 2017-05-18 LAB — HEMOGLOBIN AND HEMATOCRIT, BLOOD
HCT: 22.4 % — ABNORMAL LOW (ref 39.0–52.0)
HEMOGLOBIN: 8 g/dL — AB (ref 13.0–17.0)

## 2017-05-18 LAB — BASIC METABOLIC PANEL
ANION GAP: 4 — AB (ref 5–15)
BUN: 16 mg/dL (ref 6–20)
CHLORIDE: 108 mmol/L (ref 101–111)
CO2: 24 mmol/L (ref 22–32)
Calcium: 7.6 mg/dL — ABNORMAL LOW (ref 8.9–10.3)
Creatinine, Ser: 0.76 mg/dL (ref 0.61–1.24)
GFR calc non Af Amer: >60 mL/min (ref >60)
Glucose, Bld: 155 mg/dL — ABNORMAL HIGH (ref 65–99)
POTASSIUM: 5 mmol/L (ref 3.5–5.1)
Sodium: 136 mmol/L (ref 135–145)

## 2017-05-18 LAB — TROPONIN I
Troponin I: <0.03 ng/mL (ref <0.03)
Troponin I: <0.03 ng/mL (ref <0.03)

## 2017-05-18 LAB — MRSA PCR SCREENING: MRSA BY PCR: NEGATIVE

## 2017-05-18 LAB — PROTIME-INR
INR: 1.29
Prothrombin Time: 16.2 seconds — ABNORMAL HIGH (ref 11.4–15.2)

## 2017-05-18 LAB — PREPARE RBC (CROSSMATCH)

## 2017-05-18 MED ORDER — SODIUM CHLORIDE 0.9 % IV SOLN
Freq: Once | INTRAVENOUS | Status: AC
  Administered 2017-05-18: 09:00:00 via INTRAVENOUS

## 2017-05-18 MED ORDER — PHENYLEPHRINE HCL-NACL 10-0.9 MG/250ML-% IV SOLN
0.0000 ug/min | INTRAVENOUS | Status: DC
  Administered 2017-05-18: 70 ug/min via INTRAVENOUS
  Administered 2017-05-18 (×3): 100 ug/min via INTRAVENOUS
  Administered 2017-05-18: 75 ug/min via INTRAVENOUS
  Administered 2017-05-18 (×4): 100 ug/min via INTRAVENOUS
  Administered 2017-05-19: 60 ug/min via INTRAVENOUS
  Administered 2017-05-19: 40 ug/min via INTRAVENOUS
  Filled 2017-05-18 (×13): qty 250

## 2017-05-18 MED ORDER — LACTATED RINGERS IV SOLN
INTRAVENOUS | Status: DC
  Administered 2017-05-18 – 2017-05-21 (×3): via INTRAVENOUS

## 2017-05-18 MED ORDER — AMIODARONE HCL 200 MG PO TABS
200.0000 mg | ORAL_TABLET | Freq: Every morning | ORAL | Status: DC
  Administered 2017-05-19 – 2017-05-24 (×6): 200 mg via ORAL
  Filled 2017-05-18 (×6): qty 1

## 2017-05-18 MED ORDER — PHENTOLAMINE MESYLATE 5 MG IJ SOLR
5.0000 mg | Freq: Once | INTRAMUSCULAR | Status: AC
  Administered 2017-05-18: 5 mg via SUBCUTANEOUS
  Filled 2017-05-18: qty 5

## 2017-05-18 NOTE — Progress Notes (Addendum)
At 2300 patient's BP was lower than it had been. I went in to retake the blood pressure on the other arm and noticed the IV with the neo running was infiltrated. I immediately stopped the infusion, removed the infiltrated IV, and elevated the extremity and applied dry heat pack. I notified Elink and called pharmacy to have the antidote ordered. The antidote was ordered and injected along the extravasation site. Neo gtt was moved to the second IV that had verified blood return. BP subsequently improved. IV team was also consulted for a new IV and to verify how to treat the extravasation.

## 2017-05-18 NOTE — Progress Notes (Signed)
1 Day Post-Op Subjective: Patient reports no pain.  He has no nausea.  No untoward events overnight.  Objective: Vital signs in last 24 hours: Temp:  [97 F (36.1 C)-98.6 F (37 C)] 98.2 F (36.8 C) (06/01 0354) Pulse Rate:  [45-133] 56 (06/01 0700) Resp:  [9-27] 15 (06/01 0700) BP: (73-139)/(27-80) 100/60 (06/01 0700) SpO2:  [94 %-100 %] 99 % (06/01 0700) Weight:  [83.5 kg (184 lb)-85 kg (187 lb 6.3 oz)] 85 kg (187 lb 6.3 oz) (05/31 2100)  Intake/Output from previous day: 05/31 0701 - 06/01 0700 In: 22350.5 [P.O.:240; I.V.:4555.5; Blood:1355; IV Piggyback:250] Out: 5500 [Urine:5250; Blood:250] Intake/Output this shift: No intake/output data recorded.  Physical Exam:  Constitutional: Vital signs reviewed. WD WN in NAD   Eyes: PERRL, No scleral icterus.   Cardiovascular: mild bradycardia.  Appears that he is in atrial fibrillation. Pulmonary/Chest: Normal effort  Extremities: No cyanosis or edema   Lab Results:  Recent Labs  05/17/17 1537 05/17/17 2315 05/18/17 0533  HGB 8.9* 8.4* 7.7*  HCT 25.9* 24.3* 21.5*   BMET  Recent Labs  05/17/17 1030 05/17/17 2315  NA 137 136  K 4.4 5.0  CL 100* 108  CO2  --  24  GLUCOSE 121* 155*  BUN 20 16  CREATININE 0.80 0.76  CALCIUM  --  7.6*    Recent Labs  05/17/17 2315  INR 1.29   No results for input(s): LABURIN in the last 72 hours. Results for orders placed or performed during the hospital encounter of 05/17/17  MRSA PCR Screening     Status: None   Collection Time: 05/17/17 11:06 PM  Result Value Ref Range Status   MRSA by PCR NEGATIVE NEGATIVE Final    Comment:        The GeneXpert MRSA Assay (FDA approved for NASAL specimens only), is one component of a comprehensive MRSA colonization surveillance program. It is not intended to diagnose MRSA infection nor to guide or monitor treatment for MRSA infections.     Studies/Results: No results found.  Assessment/Plan:   Postoperative day #1 TURP  with postoperative bleed necessitating return to the OR and clot evacuation.  Urine is clearing now.  He still has significant anemia, and with the patient's consent, I have ordered a third unit of blood to be administered today.  Will leave in the stepdown unit for now.  I appreciate critical care assistance with him.  He will be seen by cardiology today.  If urine clear, I'm okay with discontinuing catheter tomorrow morning.  We will be following.     LOS: 0 days   Marcine MatarDAHLSTEDT, Clare Fennimore M 05/18/2017, 8:10 AM

## 2017-05-18 NOTE — Consult Note (Signed)
Cardiology Consult    Patient ID: Adrian Ramos MRN: [id number], DOB/AGE: Aug 22, 1946   Admit date: 05/17/2017 Date of Consult: 05/18/2017  Primary Physician: Martha Clan, MD Primary Cardiologist: Dr. Duke Salvia Requesting Provider: Dr. Retta Diones  Reason for Consult: paroxysmal atrial fibrillation and heart failure  Patient Profile    Adrian Ramos has a PMH significant for chronic systolic and diastolic heart failure, paroxysmal Afib, alcohol abuse, nonobstructive CAD, RBBB, HTN, hx of urinary retention, and BPH.   Adrian Ramos is a 71 y.o. male who is being seen today for the evaluation of parosysmal Afib and hx of combine heart failure at the request of Dr. Retta Diones.   Past Medical History   Past Medical History:  Diagnosis Date  . Alcohol abuse    as of 05-08-2017 per pt in remission since 10/ 2017 down to 2 drinks per month (which is not remission)  . Anxiety   . BPH (benign prostatic hyperplasia)   . Cardiomyopathy (HCC)    currrently ef 55-60% per echo 03-05-2017 up from previous echo 10-11-2016 35-40%  . Chronic back pain   . Complication of anesthesia 2007   POST-OP URINARY RETENTION  . Coronary artery disease CARDIOLOGIST-  DR [facility] [place]   cardiac/coronary CT 10-16-2016-- score 97 ,  moderate CAD in LAD and mild CAD in RCA  . Foley catheter in place   . GERD (gastroesophageal reflux disease)   . History of urinary retention 2007   POST OP  (BPH W/ BLADDER OUTLET OBSTRUCTION)  . Hypertension   . IDA (iron deficiency anemia)   . Insomnia   . PAF (paroxysmal atrial fibrillation) (HCC) CARDIOLOGIST-  DR [facility] [place]   EPISODE 10/ 2017 FAILED CARDIOVERSION 10-12-2016 BUT CONVERTED TO NSR W/ AMIODORONE  . Pulmonary nodule, left 10/16/2016  . RBBB (right bundle branch block)   . Recurrent right inguinal hernia   . Systolic and diastolic CHF, chronic [facility])    cardiologist-  dr Duke Salvia ([place]) per 03-05-2017 , ef 55-60%  . Urinary retention     Past  Surgical History:  Procedure Laterality Date  . BENIGN PENILE GROWTH REMOVED  1985  . CARDIAC CATHETERIZATION  2000   NORMAL  (FALSE POSITIVE STRESS TEST)    . CARDIOVASCULAR STRESS TEST  10-15-2016  dr Elmarie Shiley Frazer    nuclear study w/  large fixed defect in the inferoseptal region, no inducable ischemia/  LVEF 36% and global hypokinesis  . CARDIOVERSION N/A 10/12/2016   Procedure: CARDIOVERSION;  Surgeon: Quintella Reichert, MD;  Location: [facility] ENDOSCOPY;  Service: Cardiovascular;  Laterality: N/A;  . CATARACT EXTRACTION W/ INTRAOCULAR LENS  IMPLANT, BILATERAL Bilateral 06/2015-07/2015  . INGUINAL HERNIA REPAIR Right 07-19-2006  . KNEE ARTHROSCOPY  03/13/2012   Procedure: ARTHROSCOPY KNEE;  Surgeon: Loanne Drilling, MD;  Location: [facility];  Service: Orthopedics;  Laterality: Left;  WITH DEBRIDEMENT medial meniscus  . REPAIR RECURRENT RIGHT INGUINAL HERNIA  12-03-2006  . TEE WITHOUT CARDIOVERSION N/A 10/12/2016   Procedure: TRANSESOPHAGEAL ECHOCARDIOGRAM (TEE);  Surgeon: Quintella Reichert, MD;  Location[facility] ENDOSCOPY;  Service: Cardiovascular;  Laterality: N/A; EF 40-45%/ trivial AR & MR/ mild to moderate LAE/ no evidence of RA thrombus or appendage/ no evidence of vegetation TR or PR  . TRANSTHORACIC ECHOCARDIOGRAM  03/05/2017   normal LVF, ef 55-60%/ mild LAE/ trivial TR/  mild increase PASP     Allergies  Allergies  Allergen Reactions  . Iodinated Diagnostic Agents Hives and Other (See Comments)  Pt needs 13 hour pre med prior to scan.   Carlena Hurl [Rivaroxaban] Diarrhea    History of Present Illness    Adrian Ramos is well-known to this service and last saw Dr. Duke Salvia in clinic on 05/07/17. At that time, he was in his usual state of health and taking amiodarone 100 mg, lipitor, ASA, lasix, and losartan.   He had a remote hospitalization following a preoperative surgical clearance clinic appt when he was found to be short of breath and in Afib RVR (09/2016).  During that hospitalization, he underwent unsuccessful DCCV. He spontaneously converted to NSR as he was loaded on amiodarone. He also underwent myoview and cardiac CT which revealed moderate CAD that was treated medically. He had bradycardia and lopressor was switched to coreg. Bouts of second degree heart block were noted on telemetry, but it was determined he was stable for discharge.  He presented to [facility] for an elective transurethral resection of the prostate (05/17/17). He had a bleeding complication and was taken back to the OR for clot evacuation and cauterization of prostatic fossa. Critical care was consulted for hypotension and anemia. He was placed on pressors for pressure support and was transfused 2 U PRBC.   On my interview, he denies chest pain, shortness of breath, palpitations, dizziness/lightheadedness, nausea, and near-/syncope. Telemetry reveals NSR, on home amiodarone. Anticoagulation is currently held for melena at clinic visit and in the post-operative period following a bleeding complication.    Inpatient Medications    . amiodarone  100 mg Oral q morning - 10a  . atorvastatin  40 mg Oral QPM  . famotidine  10 mg Oral QHS  . senna  1 tablet Oral BID  . sulfamethoxazole-trimethoprim  1 tablet Oral Q12H  . traZODone  200 mg Oral QHS     Outpatient Medications    Prior to Admission medications   Medication Sig Start Date End Date Taking? Authorizing Provider  acidophilus (RISAQUAD) CAPS capsule Take 1 capsule by mouth daily.   Yes [provider]  amiodarone (PACERONE) 200 MG tablet Take 100 mg by mouth daily.   Yes [provider]  Ascorbic Acid (VITAMIN C) 1000 MG tablet Take 1,000 mg by mouth daily.   Yes [provider]  Ascorbic Acid (VITAMIN C) 1000 MG tablet Take 1,000 mg by mouth daily.   Yes [provider]  aspirin EC 81 MG tablet Take 81 mg by mouth every evening.    Yes [provider]  atorvastatin (LIPITOR) 40  MG tablet Take 1 tablet (40 mg total) by mouth daily at 6 PM. 10/16/16  Yes Barrett, Joline Salt, PA-C  chlorhexidine (PERIDEX) 0.12 % solution Use as directed 10 mLs in the mouth or throat daily.    Yes [provider]  cholecalciferol (VITAMIN D) 1000 units tablet Take 1,000 Units by mouth every evening.   Yes [provider]  Clotrimazole 1 % OINT Apply 1 application topically 2 (two) times daily as needed (for itching/irritation).   Yes [provider]  Coenzyme Q10 (COQ10) 100 MG CAPS Take 100 mg by mouth daily.   Yes [provider]  Cyanocobalamin (VITAMIN B-12) 5000 MCG SUBL Place 5,000 mcg under the tongue daily.   Yes [provider]  diazepam (VALIUM) 10 MG tablet Take 10 mg by mouth 4 (four) times daily as needed for anxiety.    Yes [provider]  docusate sodium (COLACE) 100 MG capsule Take 100 mg by mouth daily.   Yes  [provider]  finasteride (PROSCAR) 5 MG tablet Take 5 mg by mouth every evening.   Yes [provider]  furosemide (LASIX) 40 MG tablet Take 40 mg by mouth daily.    Yes [provider]  losartan (COZAAR) 25 MG tablet Take 1 tablet (25 mg total) by mouth daily. 10/17/16  Yes Barrett, Rhonda G, PA-C  milk thistle 175 MG tablet Take 175 mg by mouth daily.   Yes [provider]  Misc Natural Products (PROSTATE HEALTH) CAPS Take 1 capsule by mouth daily.    Yes [provider]  omega-3 acid ethyl esters (LOVAZA) 1 g capsule Take 1 g by mouth daily.    Yes [provider]  ranitidine (ZANTAC) 150 MG tablet TAKE 1 TABLET (150 MG TOTAL) BY MOUTH 2 (TWO) TIMES DAILY. 05/07/17  Yes Armbruster, Reeves ForthSteven Paul, MD  traZODone (DESYREL) 100 MG tablet Take 200 mg by mouth at bedtime.    Yes [provider]  vitamin E 400 UNIT capsule Take 400 Units by mouth daily.    Yes [provider]     Family History    Family History  Problem Relation Age of Onset  .  Cerebrovascular Accident Mother   . Hodgkin's lymphoma Mother   . Depression Father   . Heart disease Father   . Hypertension Father   . Anxiety disorder Father   . Colon cancer Neg Hx     Social History    Social History   Social History  . Marital status: Single    Spouse name: N/A  . Number of children: 0  . Years of education: N/A   Occupational History  . retired    Social History Main Topics  . Smoking status: Former Smoker    Years: 25.00    Types: Cigars    Quit date: 12/07/2015  . Smokeless tobacco: Former NeurosurgeonUser    Types: Chew    Quit date: 12/18/1989     Comment: 2 CIGARS DAILY  . Alcohol use Yes     Comment: hx alcohol abuse  --- per pt 10/ 2017 down to 2 per month average   . Drug use: No     Comment: 05-08-2017 per pt  "just for 1 yr cocaine use in 1978"  . Sexual activity: Not on file   Other Topics Concern  . Not on file   Social History Narrative  . No narrative on file     Review of Systems    General:  No chills, fever, night sweats or weight changes.  Cardiovascular:  No chest pain, dyspnea on exertion, edema, orthopnea, palpitations, paroxysmal nocturnal dyspnea. Dermatological: No rash, lesions/masses Respiratory: No cough, dyspnea Urologic: No hematuria, dysuria Abdominal:   No nausea, vomiting, diarrhea, bright red blood per rectum, melena, or hematemesis Neurologic:  No visual changes, changes in mental status. All other systems reviewed and are otherwise negative except as noted above.  Physical Exam    Blood pressure 100/60, pulse (!) 56, temperature 98.2 F (36.8 C), temperature source Oral, resp. rate 15, height 5\' 3"  (1.6 m), weight 187 lb 6.3 oz (85 kg), SpO2 99 %.  General: Pleasant, NAD Psych: Normal affect. Neuro: Alert and oriented X 3. Moves all extremities spontaneously. HEENT: Normal  Neck: Supple without bruits or JVD. Lungs:  Resp regular and unlabored, CTA. Heart: RRR no no murmurs. Abdomen: Soft, non-tender,  non-distended, BS + x 4.  Extremities: No clubbing, cyanosis, trace edema. DP/PT/Radials 2+ and equal bilaterally.  Labs    Troponin (Point of Care Test) No results for input(s): TROPIPOC in the last 72 hours.  Recent Labs  05/17/17 2315 05/18/17 0533  TROPONINI <0.03 <0.03   Lab Results  Component Value Date   WBC 9.5 05/18/2017   HGB 7.7 (L) 05/18/2017   HCT 21.5 (L) 05/18/2017   MCV 89.6 05/18/2017   PLT 105 (L) 05/18/2017    Recent Labs Lab 05/17/17 2315  NA 136  K 5.0  CL 108  CO2 24  BUN 16  CREATININE 0.76  CALCIUM 7.6*  GLUCOSE 155*   Lab Results  Component Value Date   CHOL 163 10/11/2016   HDL 49 10/11/2016   LDLCALC 99 10/11/2016   TRIG 76 10/11/2016   No results found for: [facility]   Radiology Studies    No results found.  ECG & Cardiac Imaging    Repeat EKG pending  EKG 05/17/17: Afib RVR with a RBBB  Echocardiogram 03/05/17: Study Conclusions - Left ventricle: The cavity size was normal. Wall thickness was   normal. Systolic function was normal. The estimated ejection   fraction was in the range of 55% to 60%. Wall motion was normal;   there were no regional wall motion abnormalities. Left   ventricular diastolic function parameters were normal. - Left atrium: The atrium was mildly dilated. - Atrial septum: No defect or patent foramen ovale was identified. - Pulmonary arteries: Systolic pressure was mildly increased. PA   peak pressure: 36 mm Hg (S).   CT coronary 10/16/16: IMPRESSION: 1. Coronary calcium score of 97. This was 10 percentile for age and sex matched control. 2. Normal coronary origin with right dominance. 3. Moderate CAD in the LAD and mild CAD in RCA. An aggressive risk factor modification is recommended. 4. Normal size pulmonary artery.   Myoview 10/15/16: IMPRESSION: 1. Large fixed defect in the infero septal region. No stress-induced ischemia. 2. Global hypokinesis. 3. Left ventricular ejection fraction  36% 4. Non invasive risk stratification*: High risk, based on the large inferoseptal fixed defect and LV dilatation.   Echocardiogram 10/11/16: Study Conclusions - Left ventricle: The cavity size was normal. There was mild focal   basal hypertrophy of the septum. Systolic function was moderately   reduced. The estimated ejection fraction was in the range of 35%   to 40%. Diffuse hypokinesis. - Aortic valve: Trileaflet; mildly thickened, mildly calcified   leaflets. - Left atrium: The atrium was moderately dilated.   Assessment & Plan    1. Chronic systolic and diastolic heart failure - recent echo with improvement in EF to 55-60% - holding lasix and losartan - pt does not appear volume overloaded on exam   2. Paroxysmal Afib - received 2.5 mg IV lopressor in PACU x 3 for rapid heart rate and Afib following the first surgery - continue amiodarone - holding anticoagulation in the post-operative period 2/ bleeding complication, but also held at last clinic visit on 05/07/17 for melena - currently in NSR in the 50s, EKG with Afib RVR yesterday - continue to hold beta blocker at this time - This patients CHA2DS2-VASc Score and unadjusted Ischemic Stroke Rate (% per year) is equal to 3.2 % stroke rate/year from a score of 3 (CHF, HTN, age)   3. Alcohol Abuse - pt states he has cut down on his drinking since 09/2016 and only has a drink every 2 weeks   4. CAD - nonobstructive disease per cardiac CT  - pt denies anginal symptoms  Signed, Roe Rutherford Duke, PA-C 05/18/2017, 7:26 AM [phone]  As above, patient seen and examined. Briefly he is a 71 year old male with past medical history of paroxysmal atrial fibrillation, nonobstructive coronary artery disease, alcohol abuse, prior cardiomyopathy improved and now status post TURP for evaluation of paroxysmal atrial fibrillation. Cardiac CTA October 2017 showed moderate disease in the LAD and mild in the right coronary artery.  Last echocardiogram March 2018 showed ejection fraction 55-60% and mild left atrial enlargement. Patient underwent TURP yesterday and postoperatively was noted to develop atrial fibrillation. Procedure complicated by bleeding and he was returned to the operating room for clot evacuation and cauterization. Patient was given beta blockade and converted to sinus rhythm. He is now being transfused for acute blood loss anemia. He denies dyspnea at present. No chest pain, palpitations or syncope. Presently in sinus rhythm.  Patient is now in sinus rhythm. His carvedilol was recently discontinued because of bradycardia. He will be at increased risk for recurrent atrial fibrillation postoperatively particularly in light of postoperative bleeding and need for pressors. I will increase amiodarone to 200 mg daily. This could be increased further if he has more events. In 2 weeks we could resume 100 mg daily. He is not on anticoagulation at home as an EGD was pending for evaluation of possible GI blood loss. He obviously cannot be on anticoagulation at present given active bleeding following his urological procedure. He will follow-up with Dr. Duke Salvia for further discussion concerning this issue after discharge. His blood pressure is borderline. I agree with holding his ARB and diuretic. These can be resumed at discharge. Other issues per primary  service.  Olga Millers, MD

## 2017-05-19 ENCOUNTER — Inpatient Hospital Stay (HOSPITAL_COMMUNITY): Payer: Medicare Other

## 2017-05-19 DIAGNOSIS — I2510 Atherosclerotic heart disease of native coronary artery without angina pectoris: Secondary | ICD-10-CM

## 2017-05-19 DIAGNOSIS — J189 Pneumonia, unspecified organism: Secondary | ICD-10-CM | POA: Diagnosis present

## 2017-05-19 DIAGNOSIS — R001 Bradycardia, unspecified: Secondary | ICD-10-CM

## 2017-05-19 DIAGNOSIS — R579 Shock, unspecified: Secondary | ICD-10-CM

## 2017-05-19 DIAGNOSIS — Y95 Nosocomial condition: Secondary | ICD-10-CM

## 2017-05-19 DIAGNOSIS — D62 Acute posthemorrhagic anemia: Secondary | ICD-10-CM

## 2017-05-19 DIAGNOSIS — T50905A Adverse effect of unspecified drugs, medicaments and biological substances, initial encounter: Secondary | ICD-10-CM

## 2017-05-19 LAB — CBC WITH DIFFERENTIAL/PLATELET
BASOS ABS: 0 10*3/uL (ref 0.0–0.1)
Basophils Absolute: 0 10*3/uL (ref 0.0–0.1)
Basophils Relative: 0 %
Basophils Relative: 0 %
EOS PCT: 1 %
Eosinophils Absolute: 0.1 10*3/uL (ref 0.0–0.7)
Eosinophils Absolute: 0.1 10*3/uL (ref 0.0–0.7)
Eosinophils Relative: 1 %
HCT: 21.7 % — ABNORMAL LOW (ref 39.0–52.0)
HEMATOCRIT: 25 % — AB (ref 39.0–52.0)
Hemoglobin: 7.6 g/dL — ABNORMAL LOW (ref 13.0–17.0)
Hemoglobin: 8.6 g/dL — ABNORMAL LOW (ref 13.0–17.0)
LYMPHS PCT: 23 %
Lymphocytes Relative: 22 %
Lymphs Abs: 1.4 10*3/uL (ref 0.7–4.0)
Lymphs Abs: 1.2 10*3/uL (ref 0.7–4.0)
MCH: 30.9 pg (ref 26.0–34.0)
MCH: 31.4 pg (ref 26.0–34.0)
MCHC: 35 g/dL (ref 30.0–36.0)
MCHC: 34.4 g/dL (ref 30.0–36.0)
MCV: 88.2 fL (ref 78.0–100.0)
MCV: 91.2 fL (ref 78.0–100.0)
MONO ABS: 1 10*3/uL (ref 0.1–1.0)
MONO ABS: 0.9 10*3/uL (ref 0.1–1.0)
MONOS PCT: 17 %
Monocytes Relative: 16 %
NEUTROS ABS: 3 10*3/uL (ref 1.7–7.7)
Neutro Abs: 4.1 10*3/uL (ref 1.7–7.7)
Neutrophils Relative %: 62 %
Neutrophils Relative %: 60 %
PLATELETS: 95 10*3/uL — AB (ref 150–400)
PLATELETS: 101 10*3/uL — AB (ref 150–400)
RBC: 2.46 MIL/uL — ABNORMAL LOW (ref 4.22–5.81)
RBC: 2.74 MIL/uL — ABNORMAL LOW (ref 4.22–5.81)
RDW: 15.5 % (ref 11.5–15.5)
RDW: 15.4 % (ref 11.5–15.5)
WBC: 6.6 10*3/uL (ref 4.0–10.5)
WBC: 5.1 10*3/uL (ref 4.0–10.5)

## 2017-05-19 LAB — BASIC METABOLIC PANEL
Anion gap: 8 (ref 5–15)
BUN: 6 mg/dL (ref 6–20)
BUN: 5 mg/dL — AB (ref 6–20)
CALCIUM: 8.3 mg/dL — AB (ref 8.9–10.3)
CO2: 23 mmol/L (ref 22–32)
CO2: 24 mmol/L (ref 22–32)
CREATININE: 0.54 mg/dL — AB (ref 0.61–1.24)
Calcium: 7.8 mg/dL — ABNORMAL LOW (ref 8.9–10.3)
Chloride: 116 mmol/L — ABNORMAL HIGH (ref 101–111)
Chloride: 109 mmol/L (ref 101–111)
Creatinine, Ser: 0.56 mg/dL — ABNORMAL LOW (ref 0.61–1.24)
GFR calc Af Amer: >60 mL/min (ref >60)
GFR calc non Af Amer: >60 mL/min (ref >60)
GLUCOSE: 103 mg/dL — AB (ref 65–99)
GLUCOSE: 108 mg/dL — AB (ref 65–99)
Potassium: 4.4 mmol/L (ref 3.5–5.1)
Potassium: 3.6 mmol/L (ref 3.5–5.1)
Sodium: 141 mmol/L (ref 135–145)
Sodium: 141 mmol/L (ref 135–145)

## 2017-05-19 LAB — URINALYSIS, ROUTINE W REFLEX MICROSCOPIC
BACTERIA UA: NONE SEEN
Bilirubin Urine: NEGATIVE
Glucose, UA: NEGATIVE mg/dL
Ketones, ur: NEGATIVE mg/dL
Nitrite: NEGATIVE
PROTEIN: 30 mg/dL — AB
Specific Gravity, Urine: 1.008 (ref 1.005–1.030)
pH: 5 (ref 5.0–8.0)

## 2017-05-19 LAB — MAGNESIUM: Magnesium: 1.5 mg/dL — ABNORMAL LOW (ref 1.7–2.4)

## 2017-05-19 MED ORDER — AMIODARONE IV BOLUS ONLY 150 MG/100ML
150.0000 mg | Freq: Once | INTRAVENOUS | Status: AC
  Administered 2017-05-19: 150 mg via INTRAVENOUS
  Filled 2017-05-19: qty 100

## 2017-05-19 MED ORDER — DILTIAZEM HCL-DEXTROSE 100-5 MG/100ML-% IV SOLN (PREMIX)
5.0000 mg/h | INTRAVENOUS | Status: DC
  Administered 2017-05-19: 5 mg/h via INTRAVENOUS
  Administered 2017-05-19: 15 mg/h via INTRAVENOUS
  Filled 2017-05-19 (×2): qty 100

## 2017-05-19 MED ORDER — VANCOMYCIN HCL IN DEXTROSE 1-5 GM/200ML-% IV SOLN
1000.0000 mg | Freq: Two times a day (BID) | INTRAVENOUS | Status: DC
  Administered 2017-05-19 – 2017-05-20 (×3): 1000 mg via INTRAVENOUS
  Filled 2017-05-19 (×4): qty 200

## 2017-05-19 MED ORDER — ACETAMINOPHEN 325 MG PO TABS
650.0000 mg | ORAL_TABLET | Freq: Four times a day (QID) | ORAL | Status: DC | PRN
  Administered 2017-05-19 – 2017-05-22 (×4): 650 mg via ORAL
  Filled 2017-05-19 (×4): qty 2

## 2017-05-19 MED ORDER — MAGNESIUM SULFATE 2 GM/50ML IV SOLN
2.0000 g | Freq: Once | INTRAVENOUS | Status: AC
  Administered 2017-05-19: 2 g via INTRAVENOUS
  Filled 2017-05-19: qty 50

## 2017-05-19 MED ORDER — PIPERACILLIN-TAZOBACTAM 3.375 G IVPB
3.3750 g | Freq: Three times a day (TID) | INTRAVENOUS | Status: DC
  Administered 2017-05-19 – 2017-05-24 (×14): 3.375 g via INTRAVENOUS
  Filled 2017-05-19 (×16): qty 50

## 2017-05-19 NOTE — Progress Notes (Signed)
Patient in Afib RVR, notified PA Kilroy in regards to. PA Kilroy placed Amio Bolus of 150mg  per IV. Will continue to monitor and assess.

## 2017-05-19 NOTE — Assessment & Plan Note (Signed)
Related to post op bleeding has resolved  Plan Dc pressor from MAR MAP goal > 65

## 2017-05-19 NOTE — Progress Notes (Signed)
Pt bradycardic to the low 40s. Elink RN notified. Told to continue to monitor.

## 2017-05-19 NOTE — Progress Notes (Signed)
eLink Physician-Brief Progress Note Patient Name: Drucilla ChaletMark I Standard DOB: 02/10/1946 MRN: [id number]   Date of Service  05/19/2017  HPI/Events of Note  AFIB with RVR - Ventricular rate = 135. Mg++ = 1.5 and Creatinine = 0.8. Already on Amiodarone PO, Cardizem IV infusion and Metoprolol IV.   eICU Interventions  Will order: 1. Bolus with Amiodarone 150 mg IV over 10 minutes now. 2. Replace Mg++.      Intervention Category Major Interventions: Arrhythmia - evaluation and management  Sommer,Steven Eugene 05/19/2017, 10:04 PM

## 2017-05-19 NOTE — Progress Notes (Signed)
eLink Physician-Brief Progress Note Patient Name: Adrian Ramos DOB: 11/30/1946 MRN: [id number]   Date of Service  05/19/2017  HPI/Events of Note  Hyoxia - Sat = 88% on 4 L/min [place] O2. RR = 22.   eICU Interventions  Will order: 1. Portable CXR STAT. 2. Titrate  O2 as ordered to keep sat > 92%.     Intervention Category Major Interventions: Hypoxemia - evaluation and management  Adrian Ramos,Adrian Ramos 05/19/2017, 10:13 PM

## 2017-05-19 NOTE — Assessment & Plan Note (Addendum)
New cough  05/19/2017  Followed by fever and RLL infilatres late on 05/19/17 and new hypxoemia  Plan Repeat cxr 05/20/2017 Abx Anti-infectives    Start     Dose/Rate Route Frequency Ordered Stop   05/19/17 2200  vancomycin (VANCOCIN) IVPB 1000 mg/200 mL premix     1,000 mg 200 mL/hr over 60 Minutes Intravenous Every 12 hours 05/19/17 2015     05/19/17 2100  piperacillin-tazobactam (ZOSYN) IVPB 3.375 g     3.375 g 12.5 mL/hr over 240 Minutes Intravenous Every 8 hours 05/19/17 2015     05/17/17 2200  sulfamethoxazole-trimethoprim (BACTRIM DS,SEPTRA DS) 800-160 MG per tablet 1 tablet     1 tablet Oral Every 12 hours 05/17/17 2057     05/17/17 0953  ceFAZolin (ANCEF) IVPB 2g/100 mL premix     2 g 200 mL/hr over 30 Minutes Intravenous 30 min pre-op 05/17/17 [id number] 05/17/17 1130     Await cultures

## 2017-05-19 NOTE — Progress Notes (Signed)
eLink Physician-Brief Progress Note Patient Name: Adrian Ramos DOB: 07/26/1946 MRN: [id number]   Date of Service  05/19/2017  HPI/Events of Note  Multiple issues: 1. AFIB with RVR. Ventricular rate = 140-160. Patient is already on a Cardizem IV infusion at ceiling dose and Amiodarone PO and 2. Fever to 102.0 F - CXR today at about 4 PM without evidence for infiltrate. AST and ALT normal.   eICU Interventions  Will order: 1. Amiodarone 150 mg IV X 1 now.  2. BMP and Mg++ level STAT. 3. Blood Cultures X 2 now. 4. UA now. 5. Vancomycin and Zosyn per Pharmacy Consultation. 6. Tylenol 650 mg PO Q 6 hours PRN Temp > 101.0 F.     Intervention Category Major Interventions: Arrhythmia - evaluation and management  Sommer,Steven Eugene 05/19/2017, 7:58 PM

## 2017-05-19 NOTE — Progress Notes (Addendum)
At start of shift pt was on diltiazem gtt @ 15mg /hr (max) and HR was still in the 150-160s. Pt was having an increasing oxygen requirement and spiked a fever to 102F. Elink provider, Dr Arsenio LoaderSommer was notified. An amio bolus, labs, UA, blood cultures, zosyn, vanc, and tylenol were ordered and given. Pt's HR continued to be in the 130s-140s after the amio bolus so Dr Arsenio LoaderSommer was notified again and another amio bolus was ordered and given along with 2g of magnesium for a low magnesium level (1.5). After 2nd amio bolus pt's HR was still afib but in the 80s-90s (still on dilt gtt). Also notified provider of continued increasing oxygen requirement so a second chest xray was ordered and taken. Pt was placed on HFNC @ 10L.

## 2017-05-19 NOTE — Progress Notes (Signed)
Pharmacy Antibiotic Note  Adrian Ramos is a 71 y.o. male admitted on 05/17/2017 for TURP.  He was admitted to ICU for hypotension post-op, however had improved.  Tonight he spiked a fever of 102F.  Complaints of cough noted, however CXR has been negative for PNA.  Pharmacy has been consulted for Vanc & Zosyn dosing. No leukocytosis.  Renal function has been stable at patient's baseline.   Plan: Vancomycin 1g IV every 12 hours.  Goal trough 15-20 mcg/mL. Zosyn 3.375g IV q8h (4 hour infusion). Check Vancomycin trough at steady state Monitor renal function and cx data   Height: 5\' 3"  (160 cm) Weight: 187 lb 6.3 oz (85 kg) IBW/kg (Calculated) : 56.9  Temp (24hrs), Avg:98.7 F (37.1 C), Min:97.5 F (36.4 C), Max:102 F (38.9 C)   Recent Labs Lab 05/17/17 1030  05/17/17 2315 05/18/17 0533 05/18/17 1910 05/19/17 0923 05/19/17 1515  WBC  --   < > 8.5 9.5 12.1* 6.6 5.1  CREATININE 0.80  --  0.76  --   --  0.56*  --   < > = values in this interval not displayed.  Estimated Creatinine Clearance: 80.4 mL/min (A) (by C-G formula based on SCr of 0.56 mg/dL (L)).    Allergies  Allergen Reactions  . Iodinated Diagnostic Agents Hives and Other (See Comments)    Pt needs 13 hour pre med prior to scan.   Carlena Hurl. Xarelto [Rivaroxaban] Diarrhea    Antimicrobials this admission: 6/2 Vanc >>  6/2 Zosyn >>  5/31 Bactrim >> 5/31 Ancef x1  Dose adjustments this admission:  Microbiology results: 6/2 BCx: ordered 5/31 MRSA PCR: negative  Thank you for allowing pharmacy to be a part of this patient's care.  Elson ClanLilliston, Rick Carruthers Michelle 05/19/2017 8:10 PM

## 2017-05-19 NOTE — Progress Notes (Signed)
2 Days Post-Op Subjective: Patient reports no pain.  He has no nausea.  No untoward events overnight. Urine clear. Off CBI  Objective: Vital signs in last 24 hours: Temp:  [97.5 F (36.4 C)-98.8 F (37.1 C)] 97.6 F (36.4 C) (06/02 0800) Pulse Rate:  [42-63] 56 (06/02 1100) Resp:  [10-25] 10 (06/02 1100) BP: (64-171)/(20-106) 141/46 (06/02 1100) SpO2:  [90 %-100 %] 100 % (06/02 1100)  Intake/Output from previous day: 06/01 0701 - 06/02 0700 In: 4980.2 [I.V.:4450.2; Blood:30] Out: 4200 [Urine:4200] Intake/Output this shift: Total I/O In: 813.3 [I.V.:813.3] Out: -   Physical Exam:  Constitutional: Vital signs reviewed. WD WN in NAD   Eyes: PERRL, No scleral icterus.   Cardiovascular: BP now stable Pulmonary/Chest: Normal effort Urine clear ine foley Extremities: No cyanosis or edema   Lab Results:  Recent Labs  05/18/17 1413 05/18/17 1910 05/19/17 0923  HGB 8.0* 8.6* 7.6*  HCT 22.4* 24.0* 21.7*   BMET  Recent Labs  05/17/17 2315 05/19/17 0923  NA 136 141  K 5.0 4.4  CL 108 116*  CO2 24 23  GLUCOSE 155* 103*  BUN 16 6  CREATININE 0.76 0.56*  CALCIUM 7.6* 7.8*    Recent Labs  05/17/17 2315  INR 1.29   No results for input(s): LABURIN in the last 72 hours. Results for orders placed or performed during the hospital encounter of 05/17/17  MRSA PCR Screening     Status: None   Collection Time: 05/17/17 11:06 PM  Result Value Ref Range Status   MRSA by PCR NEGATIVE NEGATIVE Final    Comment:        The GeneXpert MRSA Assay (FDA approved for NASAL specimens only), is one component of a comprehensive MRSA colonization surveillance program. It is not intended to diagnose MRSA infection nor to guide or monitor treatment for MRSA infections.     Studies/Results: Dg Chest Port 1 View  Result Date: 05/18/2017 CLINICAL DATA:  Dyspnea. EXAM: PORTABLE CHEST 1 VIEW COMPARISON:  Scout image for CT scan of the chest dated 10/16/2016 FINDINGS: Heart size  and pulmonary vascularity are normal and the lungs are clear except for a tiny stable calcified granuloma at the left lung base laterally. No appreciable effusions. No significant bone abnormality. IMPRESSION: No active disease. Electronically Signed   By: Francene BoyersJames  Maxwell M.D.   On: 05/18/2017 12:57    Assessment/Plan:  Postoperative day #2 TURP. Doing well -urine now clear. D/c foley. Measure PVR x2 -ok to transfer to floor -recheck CBC/BMP in AM -appreciate cardiology and critical care recommendations     Hildred LaserBrian James Symphoni Helbling 05/19/2017, 11:34 AM

## 2017-05-19 NOTE — Progress Notes (Signed)
Progress Note  Patient Name: Adrian Ramos Date of Encounter: 05/19/2017  Primary Cardiologist: Dr. Duke Salvia  Subjective   No complaints of CP or SOB  Inpatient Medications    Scheduled Meds: . amiodarone  200 mg Oral q morning - 10a  . atorvastatin  40 mg Oral QPM  . famotidine  10 mg Oral QHS  . senna  1 tablet Oral BID  . sulfamethoxazole-trimethoprim  1 tablet Oral Q12H  . traZODone  200 mg Oral QHS   Continuous Infusions: . lactated ringers 50 mL/hr at 05/19/17 0900  . phenylephrine 20 mcg/min (05/19/17 0918)   PRN Meds: diazepam, ondansetron, opium-belladonna, oxyCODONE, phenol   Vital Signs    Vitals:   05/19/17 0615 05/19/17 0700 05/19/17 0800 05/19/17 0900  BP: (!) 117/26 (!) 141/36 (!) 158/33 (!) 141/40  Pulse: (!) 44 (!) 58 (!) 55 (!) 49  Resp: 20 13 18 15   Temp:   97.6 F (36.4 C)   TempSrc:   Oral   SpO2: 94% 98% 96% 100%  Weight:      Height:        Intake/Output Summary (Last 24 hours) at 05/19/17 0937 Last data filed at 05/19/17 0900  Gross per 24 hour  Intake          4048.42 ml  Output             4200 ml  Net          -151.58 ml   Filed Weights   05/17/17 0903 05/17/17 2100  Weight: 184 lb (83.5 kg) 187 lb 6.3 oz (85 kg)    Telemetry    NSR - Personally Reviewed  ECG    No new EKG to review - Personally Reviewed  Physical Exam   GEN: No acute distress.   Neck: No JVD Cardiac: RRR, no murmurs, rubs, or gallops.  Respiratory: Clear to auscultation bilaterally. GI: Soft, nontender, non-distended  MS: No edema; No deformity. Neuro:  Nonfocal  Psych: Normal affect   Labs    Chemistry Recent Labs Lab 05/17/17 1030 05/17/17 2315  NA 137 136  K 4.4 5.0  CL 100* 108  CO2  --  24  GLUCOSE 121* 155*  BUN 20 16  CREATININE 0.80 0.76  CALCIUM  --  7.6*  GFRNONAA  --  >60  GFRAA  --  >60  ANIONGAP  --  4*     Hematology Recent Labs Lab 05/17/17 2315 05/18/17 0533 05/18/17 1413 05/18/17 1910  WBC 8.5 9.5  --   12.1*  RBC 2.69* 2.40*  --  2.68*  HGB 8.4* 7.7* 8.0* 8.6*  HCT 24.3* 21.5* 22.4* 24.0*  MCV 90.3 89.6  --  89.6  MCH 31.2 32.1  --  32.1  MCHC 34.6 35.8  --  35.8  RDW 14.9 15.0  --  15.2  PLT 102* 105*  --  115*    Cardiac Enzymes Recent Labs Lab 05/17/17 2315 05/18/17 0533 05/18/17 1413  TROPONINI <0.03 <0.03 <0.03   No results for input(s): TROPIPOC in the last 168 hours.   BNPNo results for input(s): BNP, PROBNP in the last 168 hours.   DDimer No results for input(s): DDIMER in the last 168 hours.   Radiology    Dg Chest Port 1 View  Result Date: 05/18/2017 CLINICAL DATA:  Dyspnea. EXAM: PORTABLE CHEST 1 VIEW COMPARISON:  Scout image for CT scan of the chest dated 10/16/2016 FINDINGS: Heart size and pulmonary vascularity are normal and  the lungs are clear except for a tiny stable calcified granuloma at the left lung base laterally. No appreciable effusions. No significant bone abnormality. IMPRESSION: No active disease. Electronically Signed   By: Francene BoyersJames  Maxwell M.D.   On: 05/18/2017 12:57    Cardiac Studies  2D echo 02/2017 Study Conclusions  - Left ventricle: The cavity size was normal. Wall thickness was   normal. Systolic function was normal. The estimated ejection   fraction was in the range of 55% to 60%. Wall motion was normal;   there were no regional wall motion abnormalities. Left   ventricular diastolic function parameters were normal. - Left atrium: The atrium was mildly dilated. - Atrial septum: No defect or patent foramen ovale was identified. - Pulmonary arteries: Systolic pressure was mildly increased. PA   peak pressure: 36 mm Hg (S).  CT coronary 10/16/16: IMPRESSION: 1. Coronary calcium score of 97. This was 5642 percentile for age and sex matched control. 2. Normal coronary origin with right dominance. 3. Moderate CAD in the LAD and mild CAD in RCA. An aggressive risk factor modification is recommended. 4. Normal size pulmonary  artery.   Myoview 10/15/16: IMPRESSION: 1. Large fixed defect in the infero septal region. No stress-induced ischemia. 2. Global hypokinesis. 3. Left ventricular ejection fraction 36% 4. Non invasive risk stratification*: High risk, based on the large inferoseptal fixed defect and LV dilatation.    Patient Profile     71 y.o. male with past medical history of paroxysmal atrial fibrillation, nonobstructive coronary artery disease, alcohol abuse, prior cardiomyopathy improved and now status post TURP for evaluation of paroxysmal atrial fibrillation. Cardiac CTA October 2017 showed moderate disease in the LAD and mild in the right coronary artery. Last echocardiogram March 2018 showed ejection fraction 55-60% and mild left atrial enlargement. Patient underwent TURP yesterday and postoperatively was noted to develop atrial fibrillation. Procedure complicated by bleeding and he was returned to the operating room for clot evacuation and cauterization. Patient was given beta blockade and converted to sinus rhythm.  Assessment & Plan    1. Chronic systolic and diastolic heart failure - recent echo with improvement in EF to 55-60% - holding lasix and losartan for hypotension - pt does not appear volume overloaded on exam   2. Paroxysmal Afib - received 2.5 mg IV lopressor in PACU x 3 for rapid heart rate and Afib following the first surgery - now back in sinus bradycardia (HR 45-58bpm) and maintaining on Amio - continue amiodarone and continue to hold beta blocker at this time due to braydcardia - continue to hold anticoagulation in the post-operative period 2/ bleeding complication, but also held at last clinic visit on 05/07/17 for melena - EGD pending as outpt - This patients CHA2DS2-VASc Score and unadjusted Ischemic Stroke Rate (% per year) is equal to 3.2 % stroke rate/year from a score of 3 (CHF, HTN, age)  3.  Acute blood loss anemia Hbg up to 8.6 after transfusion (7.7 was  lowest).   - anticoagulation on hold and has been on hold prior to admission due to melena - has outpt EGD pending.   4. Alcohol Abuse - pt states he has cut down on his drinking since 09/2016 and only has a drink every 2 weeks   5. CAD - nonobstructive disease per cardiac CTA and no ischemia on nuclear stress test 2017 - pt denies anginal symptoms    Signed, Armanda Magicraci Charnice Zwilling, MD  05/19/2017, 9:37 AM

## 2017-05-19 NOTE — Progress Notes (Signed)
Called twice about AF with RVR- HR 130's. He reportedly is tolerating this well. He has had problems with bradycardia in the past. Amiodarone resumed PO but he had apparently missed a couple of doses. I ordered a 150 mg IV Amiodarone bolus x 1 but after an hour his HR is still 130. I added low dose IV Diltiazem 5 mg/hr.   Corine ShelterLUKE Dewight Catino PA-C 05/19/2017 4:32 PM

## 2017-05-19 NOTE — Consult Note (Signed)
Name: Adrian ChaletMark I Ramos MRN: [id number] DOB: 04/02/1946    ADMISSION DATE:  05/17/2017 CONSULTATION DATE:  05/17/2017  REFERRING MD :  Dr. Retta Dionesahlstedt [facility]  CHEF COMPLAINT:  Hypotension post TURP   brief:  71 year old male with PMH as below, which is significant for PAF (on ASA only), and CHF (likely due to AF or ETOH abuse, EF now recovered as he has largely stopped drinking). He has recently complained of urinary retention, which was discovered to be the result of an enlarged prostate. He presented for elective TURP 5/31. Post-operatively he had significant bleeding from the urethra and needed to be taken back to have cautery performed. After the second procedure his hemoglobin had fallen from 13 initially to 8.9 and he was hypotensive. He was ordered 2 units of blood and phenylephrine for BP support. Due to need for pressors he was transferred to ICU and PCCM was asked to assist in his care. He denies chest pain, dyspnea, and dizziness.   SIGNIFICANT EVENTS  5/31 bleeding after TURP requiring blood products and pressors. Admit to ICU   SUBJECTIVE:  6/2 -  Off pressors. Having new dru cough. Using IS. Feeling better overall though./No active bleeding  VITAL SIGNS: Temp:  [97.5 F (36.4 C)-98.8 F (37.1 C)] 97.6 F (36.4 C) (06/02 0800) Pulse Rate:  [42-63] 49 (06/02 0900) Resp:  [13-25] 15 (06/02 0900) BP: (64-171)/(20-106) 141/40 (06/02 0900) SpO2:  [90 %-100 %] 100 % (06/02 0900)   EXAM  General Appearance:    Looks chronically unwell but per RN looking better  Head:    Normocephalic, without obvious abnormality, atraumatic  Eyes:    PERRL - yes, conjunctiva/corneas - clear      Ears:    Normal external ear canals, both ears  Nose:   NG tube - no  Throat:  ETT TUBE - no , OG tube - no  Neck:   Supple,  No enlargement/tenderness/nodules     Lungs:     Clear to auscultation bilaterally,   Chest wall:    No deformity  Heart:    S1 and S2 normal, no murmur, CVP - no.   Pressors - no  Abdomen:     Soft, no masses, no organomegaly  Genitalia:    Not done  Rectal:   not done  Extremities:   Extremities- intact     Skin:   Intact in exposed areas .      Neurologic:   Sedation - none -> RASS - +1 . Moves all 4s - yes. CAM-ICU - neg . Orientation - x3+       LABS  PULMONARY  Recent Labs Lab 05/17/17 1030  TCO2 28    CBC  Recent Labs Lab 05/18/17 0533 05/18/17 1413 05/18/17 1910 05/19/17 0923  HGB 7.7* 8.0* 8.6* 7.6*  HCT 21.5* 22.4* 24.0* 21.7*  WBC 9.5  --  12.1* 6.6  PLT 105*  --  115* 95*    COAGULATION  Recent Labs Lab 05/17/17 2315  INR 1.29    CARDIAC   Recent Labs Lab 05/17/17 2315 05/18/17 0533 05/18/17 1413  TROPONINI <0.03 <0.03 <0.03   No results for input(s): PROBNP in the last 168 hours.   CHEMISTRY  Recent Labs Lab 05/17/17 1030 05/17/17 2315 05/19/17 0923  NA 137 136 141  K 4.4 5.0 4.4  CL 100* 108 116*  CO2  --  24 23  GLUCOSE 121* 155* 103*  BUN 20 16 6   CREATININE 0.80  0.76 0.56*  CALCIUM  --  7.6* 7.8*   Estimated Creatinine Clearance: 80.4 mL/min (A) (by C-G formula based on SCr of 0.56 mg/dL (L)).   LIVER  Recent Labs Lab 05/17/17 2315  INR 1.29     INFECTIOUS No results for input(s): LATICACIDVEN, PROCALCITON in the last 168 hours.   ENDOCRINE CBG (last 3)  No results for input(s): GLUCAP in the last 72 hours.       IMAGING x48h  - image(s) personally visualized  -   highlighted in bold Dg Chest Port 1 View  Result Date: 05/18/2017 CLINICAL DATA:  Dyspnea. EXAM: PORTABLE CHEST 1 VIEW COMPARISON:  Scout image for CT scan of the chest dated 10/16/2016 FINDINGS: Heart size and pulmonary vascularity are normal and the lungs are clear except for a tiny stable calcified granuloma at the left lung base laterally. No appreciable effusions. No significant bone abnormality. IMPRESSION: No active disease. Electronically Signed   By: Francene Boyers M.D.   On: 05/18/2017 12:57         ASSESSMENT and PLAN  Cough New 05/19/2017 . Clear CXR yesterday  Plan Repeat cxr give new cough 05/19/2017   Shock circulatory (HCC) Related to post op bleeding has resolved  Plan Dc pressor from [facility] MAP goal > 65  Acute post-hemorrhagic anemia  Recent Labs Lab 05/17/17 2315 05/18/17 0533 05/18/17 1413 05/18/17 1910 05/19/17 0923  HGB 8.4* 7.7* 8.0* 8.6* 7.6*    Bp/hr stable  Plan Recheck cbc 3pm 05/19/2017  - PRBC for hgb </= 6.9gm%    - exceptions are   -  if ACS susepcted/confirmed then transfuse for hgb </= 8.0gm%,  or    -  active bleeding with hemodynamic instability, then transfuse regardless of hemoglobin value   At at all times try to transfuse 1 unit prbc as possible with exception of active hemorrhage        FAMILY  - Updates: 05/19/2017 --> patient updated  - Inter-disciplinary family meet or Palliative Care meeting due by:  DAy 7. Current LOS is LOS 1 days  CODE STATUS    Code Status Orders        Start     Ordered   05/17/17 2057  Full code  Continuous     05/17/17 2056    Code Status History    Date Active Date Inactive Code Status Order ID Comments User Context   10/10/2016  5:27 PM 10/16/2016  9:11 PM Full Code [id number]  Manson Passey, PA Inpatient    Advance Directive Documentation     Most Recent Value  Type of Advance Directive  Healthcare Power of Attorney, Living will  Pre-existing out of facility DNR order (yellow form or pink MOST form)  -  "MOST" Form in Place?  -        DISPO Per urology     Dr. Kalman Shan, M.D., [facility].C.P Pulmonary and Critical Care Medicine Staff Physician [facility] Pager: [phone], If no answer or between  15:00h - 7:00h: call [phone]  05/19/2017 10:44 AM

## 2017-05-19 NOTE — Assessment & Plan Note (Addendum)
This has resolved as of 05/20/2017 and 05/19/17 as well   Plan - PRBC for hgb </= 6.9gm%    - exceptions are   -  if ACS susepcted/confirmed then transfuse for hgb </= 8.0gm%,  or    -  active bleeding with hemodynamic instability, then transfuse regardless of hemoglobin value   At at all times try to transfuse 1 unit prbc as possible with exception of active hemorrhage

## 2017-05-20 ENCOUNTER — Inpatient Hospital Stay (HOSPITAL_COMMUNITY): Payer: Medicare Other

## 2017-05-20 DIAGNOSIS — R6520 Severe sepsis without septic shock: Secondary | ICD-10-CM

## 2017-05-20 DIAGNOSIS — J9601 Acute respiratory failure with hypoxia: Secondary | ICD-10-CM

## 2017-05-20 DIAGNOSIS — I10 Essential (primary) hypertension: Secondary | ICD-10-CM

## 2017-05-20 DIAGNOSIS — A419 Sepsis, unspecified organism: Secondary | ICD-10-CM

## 2017-05-20 LAB — HEPATIC FUNCTION PANEL
ALT: 19 U/L (ref 17–63)
AST: 22 U/L (ref 15–41)
Albumin: 3.1 g/dL — ABNORMAL LOW (ref 3.5–5.0)
Alkaline Phosphatase: 53 U/L (ref 38–126)
BILIRUBIN DIRECT: 0.2 mg/dL (ref 0.1–0.5)
BILIRUBIN TOTAL: 0.7 mg/dL (ref 0.3–1.2)
Indirect Bilirubin: 0.5 mg/dL (ref 0.3–0.9)
Total Protein: 5.7 g/dL — ABNORMAL LOW (ref 6.5–8.1)

## 2017-05-20 LAB — PROCALCITONIN: Procalcitonin: <0.1 ng/mL

## 2017-05-20 LAB — LACTIC ACID, PLASMA: Lactic Acid, Venous: 1.3 mmol/L (ref 0.5–1.9)

## 2017-05-20 LAB — PHOSPHORUS: Phosphorus: 2.9 mg/dL (ref 2.5–4.6)

## 2017-05-20 LAB — BASIC METABOLIC PANEL
Anion gap: 5 (ref 5–15)
BUN: 6 mg/dL (ref 6–20)
CHLORIDE: 109 mmol/L (ref 101–111)
CO2: 24 mmol/L (ref 22–32)
Calcium: 7.5 mg/dL — ABNORMAL LOW (ref 8.9–10.3)
Creatinine, Ser: 0.71 mg/dL (ref 0.61–1.24)
GFR calc Af Amer: >60 mL/min (ref >60)
GFR calc non Af Amer: >60 mL/min (ref >60)
Glucose, Bld: 120 mg/dL — ABNORMAL HIGH (ref 65–99)
Potassium: 3.6 mmol/L (ref 3.5–5.1)
SODIUM: 138 mmol/L (ref 135–145)

## 2017-05-20 LAB — CK TOTAL AND CKMB (NOT AT ARMC)
CK, MB: 2.5 ng/mL (ref 0.5–5.0)
Relative Index: 2.1 (ref 0.0–2.5)
Total CK: 120 U/L (ref 49–397)

## 2017-05-20 LAB — CBC
HCT: 22.2 % — ABNORMAL LOW (ref 39.0–52.0)
HEMOGLOBIN: 7.8 g/dL — AB (ref 13.0–17.0)
MCH: 32 pg (ref 26.0–34.0)
MCHC: 35.1 g/dL (ref 30.0–36.0)
MCV: 91 fL (ref 78.0–100.0)
Platelets: 108 10*3/uL — ABNORMAL LOW (ref 150–400)
RBC: 2.44 MIL/uL — ABNORMAL LOW (ref 4.22–5.81)
RDW: 15.2 % (ref 11.5–15.5)
WBC: 12.9 10*3/uL — ABNORMAL HIGH (ref 4.0–10.5)

## 2017-05-20 LAB — MAGNESIUM: MAGNESIUM: 2 mg/dL (ref 1.7–2.4)

## 2017-05-20 LAB — TROPONIN I: Troponin I: <0.03 ng/mL (ref <0.03)

## 2017-05-20 NOTE — Progress Notes (Signed)
Pt HR in the 50s/60s and BP 110s/30s with a MAP of 59. Elink notified and dilt gtt turned off. Elink said that the HR and BP were okay and to continue to monitor.

## 2017-05-20 NOTE — Progress Notes (Signed)
Progress Note  Patient Name: Adrian Ramos Date of Encounter: 05/20/2017  Primary Cardiologist: Dr. Duke Salviaandolph  Subjective   He denies any chest pain or SOB  Inpatient Medications    Scheduled Meds: . amiodarone  200 mg Oral q morning - 10a  . atorvastatin  40 mg Oral QPM  . famotidine  10 mg Oral QHS  . senna  1 tablet Oral BID  . sulfamethoxazole-trimethoprim  1 tablet Oral Q12H  . traZODone  200 mg Oral QHS   Continuous Infusions: . diltiazem (CARDIZEM) infusion Stopped (05/20/17 0210)  . lactated ringers 10 mL/hr at 05/19/17 2000  . piperacillin-tazobactam (ZOSYN)  IV Stopped (05/20/17 0900)  . vancomycin 1,000 mg (05/20/17 0932)   PRN Meds: acetaminophen, diazepam, ondansetron, opium-belladonna, oxyCODONE, phenol   Vital Signs    Vitals:   05/20/17 0800 05/20/17 0900 05/20/17 0930 05/20/17 1000  BP: (!) 178/52 (!) 180/65 (!) 155/55 (!) 188/81  Pulse: (!) 33 99 88 84  Resp: (!) 26 (!) 31 (!) 30 (!) 29  Temp: 98.4 F (36.9 C)     TempSrc: Oral     SpO2: (!) 87% 92% 96% 96%  Weight:      Height:        Intake/Output Summary (Last 24 hours) at 05/20/17 1012 Last data filed at 05/20/17 1004  Gross per 24 hour  Intake          1117.83 ml  Output             1860 ml  Net          -742.17 ml   Filed Weights   05/17/17 0903 05/17/17 2100  Weight: 184 lb (83.5 kg) 187 lb 6.3 oz (85 kg)    Telemetry    NSR - Personally Reviewed  ECG    No new EKG to review - Personally Reviewed  Physical Exam   GEN: WD, WN in NAD Neck: No JVD or bruit Cardiac: RRR with no M/R/G Respiratory: CTA bilaterally GI: soft NT ND MS: No edema Neuro:  A&O x 3 Psych: normal affect  Labs    Chemistry  Recent Labs Lab 05/17/17 2315 05/19/17 0923 05/19/17 2025 05/20/17 0343  NA 136 141 141 138  K 5.0 4.4 3.6 3.6  CL 108 116* 109 109  CO2 24 23 24 24   GLUCOSE 155* 103* 108* 120*  BUN 16 6 5* 6  CREATININE 0.76 0.56* 0.54* 0.71  CALCIUM 7.6* 7.8* 8.3* 7.5*    GFRNONAA >60 >60 >60 >60  GFRAA >60 >60 >60 >60  ANIONGAP 4*  --  8 5     Hematology  Recent Labs Lab 05/19/17 0923 05/19/17 1515 05/20/17 0343  WBC 6.6 5.1 12.9*  RBC 2.46* 2.74* 2.44*  HGB 7.6* 8.6* 7.8*  HCT 21.7* 25.0* 22.2*  MCV 88.2 91.2 91.0  MCH 30.9 31.4 32.0  MCHC 35.0 34.4 35.1  RDW 15.5 15.4 15.2  PLT 95* 101* 108*    Cardiac Enzymes  Recent Labs Lab 05/17/17 2315 05/18/17 0533 05/18/17 1413  TROPONINI <0.03 <0.03 <0.03   No results for input(s): TROPIPOC in the last 168 hours.   BNPNo results for input(s): BNP, PROBNP in the last 168 hours.   DDimer No results for input(s): DDIMER in the last 168 hours.   Radiology    Dg Chest Port 1 View  Result Date: 05/19/2017 CLINICAL DATA:  Acute onset of hypoxia.  Initial encounter. EXAM: PORTABLE CHEST 1 VIEW COMPARISON:  Chest radiograph  performed earlier today at 3:24 p.m. FINDINGS: The lungs are well-aerated. Right basilar airspace opacity raises concern for pneumonia. There is no evidence of pleural effusion or pneumothorax. The cardiomediastinal silhouette is mildly enlarged. No acute osseous abnormalities are seen. IMPRESSION: 1. Right basilar airspace opacity raises concern for pneumonia. 2. Mild cardiomegaly. Electronically Signed   By: Roanna Raider M.D.   On: 05/19/2017 22:52   Dg Chest Port 1 View  Result Date: 05/19/2017 CLINICAL DATA:  Cough. EXAM: PORTABLE CHEST 1 VIEW COMPARISON:  05/18/2017 FINDINGS: 1524 hours. The cardio pericardial silhouette is enlarged. The lungs are clear wiithout focal pneumonia, edema, pneumothorax or pleural effusion. The visualized bony structures of the thorax are intact. Telemetry leads overlie the chest. IMPRESSION: Cardiomegaly without acute findings. Electronically Signed   By: Kennith Center M.D.   On: 05/19/2017 16:07   Dg Chest Port 1 View  Result Date: 05/18/2017 CLINICAL DATA:  Dyspnea. EXAM: PORTABLE CHEST 1 VIEW COMPARISON:  Scout image for CT scan of the chest  dated 10/16/2016 FINDINGS: Heart size and pulmonary vascularity are normal and the lungs are clear except for a tiny stable calcified granuloma at the left lung base laterally. No appreciable effusions. No significant bone abnormality. IMPRESSION: No active disease. Electronically Signed   By: Francene Boyers M.D.   On: 05/18/2017 12:57    Cardiac Studies  2D echo 02/2017 Study Conclusions  - Left ventricle: The cavity size was normal. Wall thickness was   normal. Systolic function was normal. The estimated ejection   fraction was in the range of 55% to 60%. Wall motion was normal;   there were no regional wall motion abnormalities. Left   ventricular diastolic function parameters were normal. - Left atrium: The atrium was mildly dilated. - Atrial septum: No defect or patent foramen ovale was identified. - Pulmonary arteries: Systolic pressure was mildly increased. PA   peak pressure: 36 mm Hg (S).  CT coronary 10/16/16: IMPRESSION: 1. Coronary calcium score of 97. This was 9 percentile for age and sex matched control. 2. Normal coronary origin with right dominance. 3. Moderate CAD in the LAD and mild CAD in RCA. An aggressive risk factor modification is recommended. 4. Normal size pulmonary artery.   Myoview 10/15/16: IMPRESSION: 1. Large fixed defect in the infero septal region. No stress-induced ischemia. 2. Global hypokinesis. 3. Left ventricular ejection fraction 36% 4. Non invasive risk stratification*: High risk, based on the large inferoseptal fixed defect and LV dilatation.    Patient Profile     71 y.o. male with past medical history of paroxysmal atrial fibrillation, nonobstructive coronary artery disease, alcohol abuse, prior cardiomyopathy improved and now status post TURP for evaluation of paroxysmal atrial fibrillation. Cardiac CTA October 2017 showed moderate disease in the LAD and mild in the right coronary artery. Last echocardiogram March 2018 showed  ejection fraction 55-60% and mild left atrial enlargement. Patient underwent TURP yesterday and postoperatively was noted to develop atrial fibrillation. Procedure complicated by bleeding and he was returned to the operating room for clot evacuation and cauterization. Patient was given beta blockade and converted to sinus rhythm.  Assessment & Plan    1. Chronic systolic and diastolic heart failure - recent echo with improvement in EF to 55-60% - had been holding lasix and losartan for hypotension - pt does not appear volume overloaded on exam   2. Paroxysmal Afib - received 2.5 mg IV lopressor in PACU x 3 for rapid heart rate and Afib following the first surgery -  was back in sinus bradycardia (HR 45-58bpm) yesterday on Amio but his dose was held due to bradycardia and then went into afib with RVR in the 130's and was given 150mg  bolus of Amio and then started on IV Cardizem gtt.  - he is now back in NSR - continue amiodarone PO and Cardizem gtt - continue to hold anticoagulation in the post-operative period 2/ bleeding complication, but also held at last clinic visit on 05/07/17 for melena - EGD pending as outpt - This patients CHA2DS2-VASc Score and unadjusted Ischemic Stroke Rate (% per year) is equal to 3.2 % stroke rate/year from a score of 3 (CHF, HTN, age)  3.  Acute blood loss anemia Hbg back down to 7.8 - down from 8.6 yesterday - anticoagulation on hold and has been on hold prior to admission due to melena - has outpt EGD pending.   4. Alcohol Abuse - pt states he has cut down on his drinking since 09/2016 and only has a drink every 2 weeks   5. CAD - nonobstructive disease per cardiac CTA and no ischemia on nuclear stress test 2017 - He denies any chest pain or SOB  6.  HTN  - BP now poorly controlled has ranging 112/37 -188/34mmHg.  High this am. - will restart home dose of Losartan 25mg  daily.      Signed, Armanda Magic, MD  05/20/2017, 10:12 AM

## 2017-05-20 NOTE — Progress Notes (Signed)
Name: Adrian Ramos MRN: [id number] DOB: [birth year]    ADMISSION DATE:  05/17/2017 CONSULTATION DATE:  05/17/2017  REFERRING MD :  Dr. Retta Diones [facility]  CHEF COMPLAINT:  Hypotension post TURP   brief:  71 year old male with PMH as below, which is significant for PAF (on ASA only), and CHF (likely due to AF or ETOH abuse, EF now recovered as he has largely stopped drinking). He has recently complained of urinary retention, which was discovered to be the result of an enlarged prostate. He presented for elective TURP 5/31. Post-operatively he had significant bleeding from the urethra and needed to be taken back to have cautery performed. After the second procedure his hemoglobin had fallen from 13 initially to 8.9 and he was hypotensive. He was ordered 2 units of blood and phenylephrine for BP support. Due to need for pressors he was transferred to ICU and PCCM was asked to assist in his care. He denies chest pain, dyspnea, and dizziness.   SIGNIFICANT EVENTS  5/31 bleeding after TURP requiring blood products and pressors. Admit to ICU 6/2 -  Off pressors. Having new dru cough. Using IS. Feeling better overall though./No active bleeding    SUBJECTIVE/OVERNIGHT/INTERVAL HX 6/3 - still with cough. Became febrile yesterday at 8pm and cultured and started on broad abx yesteday.  AFib RVR yeserday and got amio bolus at 10pm and started on po amio  And since then worsening o2 need. On 10LNC currently. Today off dilt gtt due to bradycardia. HR  Now 91 No bleeding  VITAL SIGNS: Temp:  [98.1 F (36.7 C)-102.1 F (38.9 C)] 98.4 F (36.9 C) (06/03 0800) Pulse Rate:  [54-152] 54 (06/03 0400) Resp:  [10-39] 27 (06/03 0400) BP: (111-196)/(32-91) 126/38 (06/03 0400) SpO2:  [81 %-100 %] 100 % (06/03 0400)     EXAM  General Appearance:    Looks same though more tachypneic  Head:    Normocephalic, without obvious abnormality, atraumatic  Eyes:    PERRL - yes, conjunctiva/corneas - clear        Ears:    Normal external ear canals, both ears  Nose:   NG tube - no but  Has 10L Albers need - NEW  Throat:  ETT TUBE - no , OG tube - no  Neck:   Supple,  No enlargement/tenderness/nodules     Lungs:     Clear to auscultation bilaterally but RR 25-30 not paradoxical  Chest wall:    No deformity  Heart:    S1 and S2 normal, no murmur, CVP - no.  Pressors - no  Abdomen:     Soft, no masses, no organomegaly  Genitalia:    Not done  Rectal:   not done  Extremities:   Extremities- intact     Skin:   Intact in exposed areas .     Neurologic:   Sedation - none -> RASS - +1 . Moves all 4s - yes. CAM-ICU - neg  . Orientation - x3+       LABS  PULMONARY  Recent Labs Lab 05/17/17 1030  TCO2 28    CBC  Recent Labs Lab 05/19/17 0923 05/19/17 1515 05/20/17 0343  HGB 7.6* 8.6* 7.8*  HCT 21.7* 25.0* 22.2*  WBC 6.6 5.1 12.9*  PLT 95* 101* 108*    COAGULATION  Recent Labs Lab 05/17/17 2315  INR 1.29    CARDIAC   Recent Labs Lab 05/17/17 2315 05/18/17 0533 05/18/17 1413  TROPONINI <0.03 <0.03 <0.03  No results for input(s): PROBNP in the last 168 hours.   CHEMISTRY  Recent Labs Lab 05/17/17 1030 05/17/17 2315 05/19/17 0923 05/19/17 2025 05/20/17 0343  NA 137 136 141 141 138  K 4.4 5.0 4.4 3.6 3.6  CL 100* 108 116* 109 109  CO2  --  24 23 24 24   GLUCOSE 121* 155* 103* 108* 120*  BUN 20 16 6  5* 6  CREATININE 0.80 0.76 0.56* 0.54* 0.71  CALCIUM  --  7.6* 7.8* 8.3* 7.5*  MG  --   --   --  1.5* 2.0  PHOS  --   --   --   --  2.9   Estimated Creatinine Clearance: 80.4 mL/min (by C-G formula based on SCr of 0.71 mg/dL).   LIVER  Recent Labs Lab 05/17/17 2315  INR 1.29     INFECTIOUS No results for input(s): LATICACIDVEN, PROCALCITON in the last 168 hours.   ENDOCRINE CBG (last 3)  No results for input(s): GLUCAP in the last 72 hours.       IMAGING x48h  - image(s) personally visualized  -   highlighted in bold Dg Chest Port 1  View  Result Date: 05/19/2017 CLINICAL DATA:  Acute onset of hypoxia.  Initial encounter. EXAM: PORTABLE CHEST 1 VIEW COMPARISON:  Chest radiograph performed earlier today at 3:24 p.m. FINDINGS: The lungs are well-aerated. Right basilar airspace opacity raises concern for pneumonia. There is no evidence of pleural effusion or pneumothorax. The cardiomediastinal silhouette is mildly enlarged. No acute osseous abnormalities are seen. IMPRESSION: 1. Right basilar airspace opacity raises concern for pneumonia. 2. Mild cardiomegaly. Electronically Signed   By: Roanna RaiderJeffery  Chang M.D.   On: 05/19/2017 22:52   Dg Chest Port 1 View  Result Date: 05/19/2017 CLINICAL DATA:  Cough. EXAM: PORTABLE CHEST 1 VIEW COMPARISON:  05/18/2017 FINDINGS: 1524 hours. The cardio pericardial silhouette is enlarged. The lungs are clear wiithout focal pneumonia, edema, pneumothorax or pleural effusion. The visualized bony structures of the thorax are intact. Telemetry leads overlie the chest. IMPRESSION: Cardiomegaly without acute findings. Electronically Signed   By: Kennith CenterEric  Mansell M.D.   On: 05/19/2017 16:07   Dg Chest Port 1 View  Result Date: 05/18/2017 CLINICAL DATA:  Dyspnea. EXAM: PORTABLE CHEST 1 VIEW COMPARISON:  Scout image for CT scan of the chest dated 10/16/2016 FINDINGS: Heart size and pulmonary vascularity are normal and the lungs are clear except for a tiny stable calcified granuloma at the left lung base laterally. No appreciable effusions. No significant bone abnormality. IMPRESSION: No active disease. Electronically Signed   By: Francene BoyersJames  Maxwell M.D.   On: 05/18/2017 12:57       ASSESSMENT and PLAN  HAP (hospital-acquired pneumonia) New cough  05/19/2017  Followed by fever and RLL infilatres late on 05/19/17 and new hypxoemia  Plan Repeat cxr 05/20/2017 Abx Anti-infectives    Start     Dose/Rate Route Frequency Ordered Stop   05/19/17 2200  vancomycin (VANCOCIN) IVPB 1000 mg/200 mL premix     1,000 mg 200 mL/hr  over 60 Minutes Intravenous Every 12 hours 05/19/17 2015     05/19/17 2100  piperacillin-tazobactam (ZOSYN) IVPB 3.375 g     3.375 g 12.5 mL/hr over 240 Minutes Intravenous Every 8 hours 05/19/17 2015     05/17/17 2200  sulfamethoxazole-trimethoprim (BACTRIM DS,SEPTRA DS) 800-160 MG per tablet 1 tablet     1 tablet Oral Every 12 hours 05/17/17 2057     05/17/17 0953  ceFAZolin (ANCEF) IVPB  2g/100 mL premix     2 g 200 mL/hr over 30 Minutes Intravenous 30 min pre-op 05/17/17 1610 05/17/17 1130     Await cultures  Shock circulatory (HCC) Related to post op bleeding has resolved  Plan Dc pressor from MAR MAP goal > 65  Acute post-hemorrhagic anemia This has resolved as of 05/20/2017 and 05/19/17 as well   Plan - PRBC for hgb </= 6.9gm%    - exceptions are   -  if ACS susepcted/confirmed then transfuse for hgb </= 8.0gm%,  or    -  active bleeding with hemodynamic instability, then transfuse regardless of hemoglobin value   At at all times try to transfuse 1 unit prbc as possible with exception of active hemorrhage    Acute respiratory failure with hypoxia (HCC) New onset acute resp failure with 10L hypoxemia and mild to moderate tachypnea overnight as of 05/20/2017 AM. Likely due to HAP  Plan o2 for pulsoe ox > 88% BipaP on and off day time + QHS Intubate if worse Rx HAP  Atrial fibrillation with RVR (HCC) On home po amio Needed amio bolus last ngiht (hypoxemia worse after that) Currently on po amio but off crdizem gtt  Plan Per cards  Enlarged prostate with urinary obstruction S/p TURP 53/1/18 by urology  Plan  per urology  Severe sepsis (HCC) New since late pm 05/20/17. Due to HAP  Plan Check PCT and lactate      FAMILY  - Updates: 05/20/2017 --> patient at [facility] d/w Dr Mayford Knife of cards  - Inter-disciplinary family meet or Palliative Care meeting due by:  DAy 7. Current LOS is LOS 2 days  CODE STATUS    Code Status Orders        Start      Ordered   05/17/17 2057  Full code  Continuous     05/17/17 2056    Code Status History    Date Active Date Inactive Code Status Order ID Comments User Context   10/10/2016  5:27 PM 10/16/2016  9:11 PM Full Code [id number]  Manson Passey, PA Inpatient    Advance Directive Documentation     Most Recent Value  Type of Advance Directive  Healthcare Power of Attorney, Living will  Pre-existing out of facility DNR order (yellow form or pink MOST form)  -  "MOST" Form in Place?  -        DISPO Keep in ICU     The patient is critically ill with multiple organ systems failure and requires high complexity decision making for assessment and support, frequent evaluation and titration of therapies, application of advanced monitoring technologies and extensive interpretation of multiple databases.   Critical Care Time devoted to patient care services described in this note is  30  Minutes. This time reflects time of care of this signee Dr Kalman Shan. This critical care time does not reflect procedure time, or teaching time or supervisory time of PA/NP/Med student/Med Resident etc but could involve care discussion time    Dr. Kalman Shan, M.D., [facility].C.P Pulmonary and Critical Care Medicine Staff Physician [facility]  Pulmonary and Critical Care Pager: [phone], If no answer or between  15:00h - 7:00h: call [phone]  05/20/2017 10:01 AM

## 2017-05-20 NOTE — Progress Notes (Signed)
3 Days Post-Op Subjective: After patient was seen yesterday, he developed Afib w/ RVR and was placed back on amio gtt and therefore was not transferred to the floor. He developed a cough throughout the afternoon. He then spiked a fever to 102.1 at approx 8 pm. Cx sent. CXR positive for PNA. Started on vanco/zosyn.  Had hypoxia overnight and currently on BiPAP.  No urological complaints. Voiding clear yellow urine into condom cath. PVR 0 today bedside. No dysuria, SP tenderness, or difficulty urinating.   Objective: Vital signs in last 24 hours: Temp:  [98.4 F (36.9 C)-102.1 F (38.9 C)] 98.4 F (36.9 C) (06/03 0800) Pulse Rate:  [33-152] 89 (06/03 1034) Resp:  [14-39] 28 (06/03 1034) BP: (111-196)/(32-91) 188/81 (06/03 1034) SpO2:  [81 %-100 %] 95 % (06/03 1034)  Intake/Output from previous day: 06/02 0701 - 06/03 0700 In: 1831.1 [P.O.:240; I.V.:1541.1; IV Piggyback:50] Out: 1660 [Urine:1660] Intake/Output this shift: Total I/O In: -  Out: 350 [Urine:350]  Physical Exam:  Constitutional: Vital signs reviewed.  Eyes: PERRL, No scleral icterus.   Cardiovascular: BP now stable Pulmonary/Chest: on BiPAP Urine clear yellow in condom cath bag  Lab Results:  Recent Labs  05/19/17 0923 05/19/17 1515 05/20/17 0343  HGB 7.6* 8.6* 7.8*  HCT 21.7* 25.0* 22.2*   BMET  Recent Labs  05/19/17 2025 05/20/17 0343  NA 141 138  K 3.6 3.6  CL 109 109  CO2 24 24  GLUCOSE 108* 120*  BUN 5* 6  CREATININE 0.54* 0.71  CALCIUM 8.3* 7.5*    Recent Labs  05/17/17 2315  INR 1.29   No results for input(s): LABURIN in the last 72 hours. Results for orders placed or performed during the hospital encounter of 05/17/17  MRSA PCR Screening     Status: None   Collection Time: 05/17/17 11:06 PM  Result Value Ref Range Status   MRSA by PCR NEGATIVE NEGATIVE Final    Comment:        The GeneXpert MRSA Assay (FDA approved for NASAL specimens only), is one component of  a comprehensive MRSA colonization surveillance program. It is not intended to diagnose MRSA infection nor to guide or monitor treatment for MRSA infections.     Studies/Results: Dg Chest Port 1 View  Result Date: 05/20/2017 CLINICAL DATA:  Shortness of breath, cough for 2 days EXAM: PORTABLE CHEST 1 VIEW COMPARISON:  05/19/2017 FINDINGS: Elevated right hemidiaphragm. Bibasilar atelectasis. Mild cardiomegaly. No visible effusions or edema. No acute bony abnormality. IMPRESSION: Cardiomegaly.  Bibasilar atelectasis. Electronically Signed   By: Charlett Nose M.D.   On: 05/20/2017 10:26   Dg Chest Port 1 View  Result Date: 05/19/2017 CLINICAL DATA:  Acute onset of hypoxia.  Initial encounter. EXAM: PORTABLE CHEST 1 VIEW COMPARISON:  Chest radiograph performed earlier today at 3:24 p.m. FINDINGS: The lungs are well-aerated. Right basilar airspace opacity raises concern for pneumonia. There is no evidence of pleural effusion or pneumothorax. The cardiomediastinal silhouette is mildly enlarged. No acute osseous abnormalities are seen. IMPRESSION: 1. Right basilar airspace opacity raises concern for pneumonia. 2. Mild cardiomegaly. Electronically Signed   By: Roanna Raider M.D.   On: 05/19/2017 22:52   Dg Chest Port 1 View  Result Date: 05/19/2017 CLINICAL DATA:  Cough. EXAM: PORTABLE CHEST 1 VIEW COMPARISON:  05/18/2017 FINDINGS: 1524 hours. The cardio pericardial silhouette is enlarged. The lungs are clear wiithout focal pneumonia, edema, pneumothorax or pleural effusion. The visualized bony structures of the thorax are intact. Telemetry leads overlie the  chest. IMPRESSION: Cardiomegaly without acute findings. Electronically Signed   By: Kennith CenterEric  Mansell M.D.   On: 05/19/2017 16:07    Assessment/Plan:  Postoperative day #3 TURP. Diagnosed with PNA and hypoxia overnight. Also had a run of Afib. Doing well urologically. -appreciate critical care and cardiology recommendations and management of  pulmonary/cardiovascular conditions that have arisen/worsened over the last 24hrs -continue vanco/zosyn pending c/s results -continue spontaneous voids into condom catheter   Hildred LaserBrian James Lailany Enoch 05/20/2017, 1:07 PM

## 2017-05-20 NOTE — Assessment & Plan Note (Signed)
On home po amio Needed amio bolus last ngiht (hypoxemia worse after that) Currently on po amio but off crdizem gtt  Plan Per cards

## 2017-05-20 NOTE — Assessment & Plan Note (Signed)
New since late pm 05/20/17. Due to HAP  Plan Check PCT and lactate

## 2017-05-20 NOTE — Assessment & Plan Note (Signed)
S/p TURP 53/1/18 by urology  Plan  per urology

## 2017-05-20 NOTE — Assessment & Plan Note (Signed)
New onset acute resp failure with 10L hypoxemia and mild to moderate tachypnea overnight as of 05/20/2017 AM. Likely due to HAP  Plan o2 for pulsoe ox > 88% BipaP on and off day time + QHS Intubate if worse Rx HAP

## 2017-05-21 DIAGNOSIS — I4891 Unspecified atrial fibrillation: Secondary | ICD-10-CM

## 2017-05-21 LAB — TYPE AND SCREEN
ABO/RH(D): A POS
ANTIBODY SCREEN: NEGATIVE
Unit division: 0
Unit division: 0
Unit division: 0
Unit division: 0

## 2017-05-21 LAB — VANCOMYCIN, TROUGH: Vancomycin Tr: 9 ug/mL — ABNORMAL LOW (ref 15–20)

## 2017-05-21 LAB — CREATININE, SERUM
CREATININE: 0.63 mg/dL (ref 0.61–1.24)
GFR calc non Af Amer: >60 mL/min (ref >60)

## 2017-05-21 LAB — PROCALCITONIN: PROCALCITONIN: 0.24 ng/mL

## 2017-05-21 LAB — BPAM RBC
BLOOD PRODUCT EXPIRATION DATE: 201806122359
BLOOD PRODUCT EXPIRATION DATE: 201806122359
Blood Product Expiration Date: 201806122359
Blood Product Expiration Date: 201806202359
ISSUE DATE / TIME: 201805311715
ISSUE DATE / TIME: 201805311917
ISSUE DATE / TIME: 201806010858
UNIT TYPE AND RH: 6200
Unit Type and Rh: 6200
Unit Type and Rh: 6200
Unit Type and Rh: 6200

## 2017-05-21 LAB — TROPONIN I

## 2017-05-21 LAB — PHOSPHORUS: PHOSPHORUS: 3.3 mg/dL (ref 2.5–4.6)

## 2017-05-21 LAB — MAGNESIUM: Magnesium: 1.9 mg/dL (ref 1.7–2.4)

## 2017-05-21 MED ORDER — DIAZEPAM 5 MG PO TABS: 10.0000 mg | ORAL_TABLET | Freq: Four times a day (QID) | ORAL | Status: DC | PRN

## 2017-05-21 MED ORDER — DIAZEPAM 5 MG PO TABS
10.0000 mg | ORAL_TABLET | Freq: Four times a day (QID) | ORAL | Status: DC | PRN
  Administered 2017-05-23: 10 mg via ORAL
  Filled 2017-05-21: qty 2

## 2017-05-21 NOTE — Progress Notes (Signed)
Patient on [place] with O2 sats of 91%. No distress noted. RT will continue to monitor patient.

## 2017-05-21 NOTE — Care Management Note (Signed)
Case Management Note  Patient Details  Name: Adrian Ramos MRN: [id number] Date of Birth: 07/19/1946  Subjective/Objective:                  71 year old male with PMH as below, which is significant for PAF (on ASA only), and CHF (likely due to AF or ETOH abuse, EF now recovered as he has largely stopped drinking). He has recently complained of urinary retention, which was discovered to be the result of an enlarged prostate. He presented for elective TURP 5/31. Post-operatively he had significant bleeding from the urethra and needed to be taken back to have cautery performed. After the second procedure his hemoglobin had fallen from 13 initially to 8.9 and he was hypotensive. He was ordered 2 units of blood and phenylephrine for BP support. Due to need for pressors he was transferred to ICU and PCCM was asked to assist in his care. He denies chest pain, dyspnea, and dizziness.   Date:  May 21, 2017  Chart reviewed for concurrent status and case management needs.  Will continue to follow patient progress.  Discharge Planning: following for needs  Expected discharge date: [id number]  Marcelle SmilingRhonda Malacai Grantz, BSN, [place]RN3, [place]CCM   [phone] Action/Plan:   Expected Discharge Date:                  Expected Discharge Plan:  Home/Self Care  In-House Referral:     Discharge planning Services  CM Consult  Post Acute Care Choice:    Choice offered to:     DME Arranged:    DME Agency:     HH Arranged:    HH Agency:     Status of Service:  In process, will continue to follow  If discussed at Long Length of Stay Meetings, dates discussed:    Additional Comments:  Golda AcreDavis, Vian Fluegel Lynn, RN 05/21/2017, 9:24 AM

## 2017-05-21 NOTE — Progress Notes (Signed)
Increase ipap and epap to increase VT.

## 2017-05-21 NOTE — Progress Notes (Signed)
4 Days Post-Op Subjective: Patient reports he's voiding well. Still coughing. Has not walked.  Objective: Vital signs in last 24 hours: Temp:  [98.1 F (36.7 C)-100.3 F (37.9 C)] 100.3 F (37.9 C) (06/04 1838) Pulse Rate:  [52-124] 90 (06/04 1838) Resp:  [17-33] 18 (06/04 1838) BP: (121-150)/(30-80) 136/80 (06/04 1838) SpO2:  [88 %-100 %] 94 % (06/04 1838) FiO2 (%):  [50 %] 50 % (06/04 0410)  Intake/Output from previous day: 06/03 0701 - 06/04 0700 In: 860 [I.V.:10; IV Piggyback:850] Out: 1130 [Urine:1130] Intake/Output this shift: No intake/output data recorded.  Physical Exam:  Constitutional: Vital signs reviewed. WD WN in NAD   Eyes: PERRL, No scleral icterus.   Pulmonary/Chest: Normal effort Abdominal: Soft. Non-tender, non-distended, bowel sounds are normal, no masses, organomegaly, or guarding present.    Lab Results:  Recent Labs  05/19/17 0923 05/19/17 1515 05/20/17 0343  HGB 7.6* 8.6* 7.8*  HCT 21.7* 25.0* 22.2*   BMET  Recent Labs  05/19/17 2025 05/20/17 0343 05/21/17 0328  NA 141 138  --   K 3.6 3.6  --   CL 109 109  --   CO2 24 24  --   GLUCOSE 108* 120*  --   BUN 5* 6  --   CREATININE 0.54* 0.71 0.63  CALCIUM 8.3* 7.5*  --    No results for input(s): LABPT, INR in the last 72 hours. No results for input(s): LABURIN in the last 72 hours. Results for orders placed or performed during the hospital encounter of 05/17/17  MRSA PCR Screening     Status: None   Collection Time: 05/17/17 11:06 PM  Result Value Ref Range Status   MRSA by PCR NEGATIVE NEGATIVE Final    Comment:        The GeneXpert MRSA Assay (FDA approved for NASAL specimens only), is one component of a comprehensive MRSA colonization surveillance program. It is not intended to diagnose MRSA infection nor to guide or monitor treatment for MRSA infections.   Culture, blood (Routine X 2) w Reflex to ID Panel     Status: None (Preliminary result)   Collection Time:  05/19/17  8:25 PM  Result Value Ref Range Status   Specimen Description BLOOD LEFT HAND  Final   Special Requests IN PEDIATRIC BOTTLE Blood Culture adequate volume  Final   Culture   Final    NO GROWTH 1 DAY Performed at [facility], [address] [place], [place] [address]    Report Status PENDING  Incomplete  Culture, blood (Routine X 2) w Reflex to ID Panel     Status: None (Preliminary result)   Collection Time: 05/19/17  8:25 PM  Result Value Ref Range Status   Specimen Description BLOOD LEFT HAND  Final   Special Requests   Final    BOTTLES DRAWN AEROBIC ONLY Blood Culture adequate volume   Culture   Final    NO GROWTH 1 DAY Performed at [facility], [address] [place], [place] [address]    Report Status PENDING  Incomplete    Studies/Results: Dg Chest Port 1 View  Result Date: 05/20/2017 CLINICAL DATA:  Shortness of breath, cough for 2 days EXAM: PORTABLE CHEST 1 VIEW COMPARISON:  05/19/2017 FINDINGS: Elevated right hemidiaphragm. Bibasilar atelectasis. Mild cardiomegaly. No visible effusions or edema. No acute bony abnormality. IMPRESSION: Cardiomegaly.  Bibasilar atelectasis. Electronically Signed   By: Charlett NoseKevin  Dover M.D.   On: 05/20/2017 10:26   Dg Chest Port 1  View  Result Date: 05/19/2017 CLINICAL DATA:  Acute onset of hypoxia.  Initial encounter. EXAM: PORTABLE CHEST 1 VIEW COMPARISON:  Chest radiograph performed earlier today at 3:24 p.m. FINDINGS: The lungs are well-aerated. Right basilar airspace opacity raises concern for pneumonia. There is no evidence of pleural effusion or pneumothorax. The cardiomediastinal silhouette is mildly enlarged. No acute osseous abnormalities are seen. IMPRESSION: 1. Right basilar airspace opacity raises concern for pneumonia. 2. Mild cardiomegaly. Electronically Signed   By: Roanna Raider M.D.   On: 05/19/2017 22:52    Assessment/Plan:  POD 4 TURP w/ takeback for bleeding. Voiding clear  Urine. Pulmonary status  improving.  OK for d/c from GU standpoint but still needs mobilization. Will order PT  LOS: 3 days   Marcine Matar M 05/21/2017, 8:21 PM

## 2017-05-21 NOTE — Progress Notes (Signed)
Progress Note  Patient Name: Adrian Ramos Date of Encounter: 05/21/2017  Primary Cardiologist: Dr. Duke Salvia  Subjective   Patient is feeling well; denies chest pain, SOB, and palpitations. Pt is coughing frequently - today is the first day up in the chair. Encouraged IS use.  Inpatient Medications    Scheduled Meds: . amiodarone  200 mg Oral q morning - 10a  . atorvastatin  40 mg Oral QPM  . famotidine  10 mg Oral QHS  . senna  1 tablet Oral BID  . traZODone  200 mg Oral QHS   Continuous Infusions: . diltiazem (CARDIZEM) infusion Stopped (05/20/17 0210)  . lactated ringers 10 mL/hr at 05/20/17 0800  . piperacillin-tazobactam (ZOSYN)  IV Stopped (05/21/17 0809)   PRN Meds: acetaminophen, diazepam, ondansetron, opium-belladonna, oxyCODONE, phenol   Vital Signs    Vitals:   05/21/17 0600 05/21/17 0800 05/21/17 1000 05/21/17 1139  BP: (!) 129/37 (!) 125/38 (!) 134/30   Pulse: (!) 52 71 71   Resp: (!) 23 (!) 26 (!) 21   Temp:  98.7 F (37.1 C)  98.1 F (36.7 C)  TempSrc:  Oral  Oral  SpO2: 94% 94% 96%   Weight:      Height:        Intake/Output Summary (Last 24 hours) at 05/21/17 1142 Last data filed at 05/21/17 0800  Gross per 24 hour  Intake              850 ml  Output              840 ml  Net               10 ml   Filed Weights   05/17/17 0903 05/17/17 2100  Weight: 184 lb (83.5 kg) 187 lb 6.3 oz (85 kg)     Physical Exam   General: Well developed, well nourished, male appearing in no acute distress. Head: Normocephalic, atraumatic.  Neck: Supple without bruits, no JVD. Lungs:  Resp regular and unlabored, CTA in upper lobes, scattered crackles in bases, frequent cough Heart: RRR, S1, S2, no murmur; no rub. Abdomen: Soft, non-tender, non-distended with normoactive bowel sounds. No hepatomegaly. No rebound/guarding. No obvious abdominal masses. Extremities: No clubbing, cyanosis, no edema. Distal pedal pulses are 1+ bilaterally. Neuro: Alert and  oriented X 3. Moves all extremities spontaneously. Psych: Normal affect.  Labs    Chemistry Recent Labs Lab 05/17/17 2315 05/19/17 1610 05/19/17 2025 05/20/17 0343 05/20/17 1033 05/21/17 0328  NA 136 141 141 138  --   --   K 5.0 4.4 3.6 3.6  --   --   CL 108 116* 109 109  --   --   CO2 24 23 24 24   --   --   GLUCOSE 155* 103* 108* 120*  --   --   BUN 16 6 5* 6  --   --   CREATININE 0.76 0.56* 0.54* 0.71  --  0.63  CALCIUM 7.6* 7.8* 8.3* 7.5*  --   --   PROT  --   --   --   --  5.7*  --   ALBUMIN  --   --   --   --  3.1*  --   AST  --   --   --   --  22  --   ALT  --   --   --   --  19  --   ALKPHOS  --   --   --   --  53  --   BILITOT  --   --   --   --  0.7  --   GFRNONAA >60 >60 >60 >60  --  >60  GFRAA >60 >60 >60 >60  --  >60  ANIONGAP 4*  --  8 5  --   --      Hematology Recent Labs Lab 05/19/17 0923 05/19/17 1515 05/20/17 0343  WBC 6.6 5.1 12.9*  RBC 2.46* 2.74* 2.44*  HGB 7.6* 8.6* 7.8*  HCT 21.7* 25.0* 22.2*  MCV 88.2 91.2 91.0  MCH 30.9 31.4 32.0  MCHC 35.0 34.4 35.1  RDW 15.5 15.4 15.2  PLT 95* 101* 108*    Cardiac Enzymes Recent Labs Lab 05/18/17 0533 05/18/17 1413 05/20/17 1033 05/21/17 0328  TROPONINI <0.03 <0.03 <0.03 <0.03   No results for input(s): TROPIPOC in the last 168 hours.   BNPNo results for input(s): BNP, PROBNP in the last 168 hours.   DDimer No results for input(s): DDIMER in the last 168 hours.   Radiology    Dg Chest Port 1 View  Result Date: 05/20/2017 CLINICAL DATA:  Shortness of breath, cough for 2 days EXAM: PORTABLE CHEST 1 VIEW COMPARISON:  05/19/2017 FINDINGS: Elevated right hemidiaphragm. Bibasilar atelectasis. Mild cardiomegaly. No visible effusions or edema. No acute bony abnormality. IMPRESSION: Cardiomegaly.  Bibasilar atelectasis. Electronically Signed   By: Charlett NoseKevin  Dover M.D.   On: 05/20/2017 10:26   Dg Chest Port 1 View  Result Date: 05/19/2017 CLINICAL DATA:  Acute onset of hypoxia.  Initial encounter.  EXAM: PORTABLE CHEST 1 VIEW COMPARISON:  Chest radiograph performed earlier today at 3:24 p.m. FINDINGS: The lungs are well-aerated. Right basilar airspace opacity raises concern for pneumonia. There is no evidence of pleural effusion or pneumothorax. The cardiomediastinal silhouette is mildly enlarged. No acute osseous abnormalities are seen. IMPRESSION: 1. Right basilar airspace opacity raises concern for pneumonia. 2. Mild cardiomegaly. Electronically Signed   By: Roanna RaiderJeffery  Chang M.D.   On: 05/19/2017 22:52   Dg Chest Port 1 View  Result Date: 05/19/2017 CLINICAL DATA:  Cough. EXAM: PORTABLE CHEST 1 VIEW COMPARISON:  05/18/2017 FINDINGS: 1524 hours. The cardio pericardial silhouette is enlarged. The lungs are clear wiithout focal pneumonia, edema, pneumothorax or pleural effusion. The visualized bony structures of the thorax are intact. Telemetry leads overlie the chest. IMPRESSION: Cardiomegaly without acute findings. Electronically Signed   By: Kennith CenterEric  Mansell M.D.   On: 05/19/2017 16:07     Telemetry    NSR - Personally Reviewed  ECG    05/21/17: NSR with RBBB - Personally Reviewed   Cardiac Studies   2D echo 02/2017 Study Conclusions - Left ventricle: The cavity size was normal. Wall thickness was normal. Systolic function was normal. The estimated ejection fraction was in the range of 55% to 60%. Wall motion was normal; there were no regional wall motion abnormalities. Left ventricular diastolic function parameters were normal. - Left atrium: The atrium was mildly dilated. - Atrial septum: No defect or patent foramen ovale was identified. - Pulmonary arteries: Systolic pressure was mildly increased. PA peak pressure: 36 mm Hg (S).   CT coronary 10/16/16: IMPRESSION: 1. Coronary calcium score of 97. This was 10642 percentile for age and sex matched control. 2. Normal coronary origin with right dominance. 3. Moderate CAD in the LAD and mild CAD in RCA. An aggressive  risk factor modification is recommended. 4. Normal size pulmonary artery.   Myoview 10/15/16: IMPRESSION: 1. Large fixed defect in the infero septal  region. No stress-induced ischemia. 2. Global hypokinesis. 3. Left ventricular ejection fraction 36% 4. Non invasive risk stratification*: High risk, based on the large inferoseptal fixed defect and LV dilatation.  Patient Profile     71 y.o. male with past medical history of paroxysmal atrial fibrillation, nonobstructive coronary artery disease, alcohol abuse, prior cardiomyopathy improved and now status post TURP for evaluation of paroxysmal atrial fibrillation. Cardiac CTA October 2017 showed moderate disease in the LAD and mild in the right coronary artery. Last echocardiogram March 2018 showed ejection fraction 55-60% and mild left atrial enlargement. Patient underwent TURP yesterday and postoperatively was noted to develop atrial fibrillation. Procedure complicated by bleeding and he was returned to the operating room for clot evacuation and cauterization. Patient was given beta blockade and converted to sinus rhythm.  Assessment & Plan    1. Chronic systolic and diastolic heart failure - Recent echo with improvement in EF to 55-60% - Lasix and losartan have been held for hypotension - Patient does not appear volume overloaded on exam   2. Paroxysmal A. Fib - He received 3 doses of 2.5 mg IV Lopressor and the PACU following his TURP procedure for rapid heart rate and A. Fib - Amiodarone was subsequently held for bradycardia in the 40s to 50s; however he went into A. fib RVR in the 130s and was given 150 mg bolus of amiodarone - He was placed on a diltiazem drip at 5 mg/hour; this was titrated to 15 mg/hour before being weaned off  - Diltiazem drip has been discontinued and he is in normal sinus rhythm, repeat EKG confirmed NSR  - Amiodarone has been transitioned to PO dosing - Anticoagulation on hold for postoperative bleeding;  was on hold preop for suspected melena    3. Acute blood loss anemia  - Hb 7.8 yesterday, from 8.6 - anticoagulation still on hold, was on hold previously for suspected melena   4. Alcohol Abuse - pt states he has cut down on his drinking since 09/2016 and only has a drink every 2 weeks   5. CAD - nonobstructive disease per cardiac CTA and no ischemia on nuclear stress test 2017 - He denies any chest pain or SOB   6.  HTN  - Home losartan on hold - BP well-controlled    Signed, Marcelino Duster , [place] 11:42 AM 05/21/2017 Pager: [phone]  History and all data above reviewed.  Patient examined.  I agree with the findings as above.   No chest pain.  He says that he is breathing well.  No distress The patient exam reveals COR:RRR with ectopy  ,  Lungs: Decreased breath sounds  ,  Abd: Positive bowel sounds, no rebound no guarding, Ext No edema  .  All available labs, radiology testing, previous records reviewed. Agree with documented assessment and plan. Now in NSR with ectopy.  Continue PO amiodarone.  Not an anticoagulation candidate at this point.    Fayrene Fearing Camielle Sizer  12:58 PM  05/21/2017

## 2017-05-21 NOTE — Progress Notes (Signed)
Name: Adrian Ramos MRN: [id number] DOB: 01-10-1946    ADMISSION DATE:  05/17/2017 CONSULTATION DATE:  05/17/2017  REFERRING MD :  Dr. Retta Diones [facility]  CHEF COMPLAINT:  Hypotension post TURP   brief:  71 year old man status post elective TURP on 5/31, developed postoperative hypotension, atrial fibrillation/RVR and respiratory distress requiring BiPAP  PMH -PAF (on ASA only), and CHF (likely due to AF or ETOH abuse, EF now recovered as he has largely stopped drinking).  SIGNIFICANT EVENTS  5/31 bleeding after TURP requiring blood products and pressors. Admit to ICU 6/2 -  Off pressors. Having new dru cough. Using IS. Feeling better overall though./No active bleeding 6/3 -  Became febrile yesterday at 8pm and cultured and started on broad abx yesteday.  AFib RVR yeserday and got amio bolus at 10pm and started on po amio  And since then worsening o2 need.>> bipap   SUBJECTIVE/OVERNIGHT/INTERVAL HX  Feels much improved, denies chest pain or dyspnea, able to speak in full sentences and provide. Ray. Complains of right upper extremities swelling  VITAL SIGNS: Temp:  [98.2 F (36.8 C)-101.3 F (38.5 C)] 98.7 F (37.1 C) (06/04 0800) Pulse Rate:  [48-94] 71 (06/04 1000) Resp:  [16-33] 21 (06/04 1000) BP: (121-174)/(30-71) 134/30 (06/04 1000) SpO2:  [85 %-98 %] 96 % (06/04 1000) FiO2 (%):  [50 %] 50 % (06/04 0410)                                                          EXAM  Gen. Pleasant, well-nourished, in no distress, on [place], normal affect ENT - no lesions, no post nasal drip Neck: No JVD, no thyromegaly, no carotid bruits Lungs: no use of accessory muscles, no dullness to percussion,decreased BS Rt without rales or rhonchi  Cardiovascular: Rhythm regular, heart sounds  normal, no murmurs or gallops, no peripheral edema Abdomen: soft and non-tender, no hepatosplenomegaly, BS normal. Musculoskeletal: No deformities, no cyanosis or clubbing, RUE forearm  erythema & mild swelling  Neuro:  alert, non focal   LABS  PULMONARY  Recent Labs Lab 05/17/17 1030  TCO2 28    CBC  Recent Labs Lab 05/19/17 0923 05/19/17 1515 05/20/17 0343  HGB 7.6* 8.6* 7.8*  HCT 21.7* 25.0* 22.2*  WBC 6.6 5.1 12.9*  PLT 95* 101* 108*    COAGULATION  Recent Labs Lab 05/17/17 2315  INR 1.29    CARDIAC    Recent Labs Lab 05/17/17 2315 05/18/17 0533 05/18/17 1413 05/20/17 1033 05/21/17 0328  TROPONINI <0.03 <0.03 <0.03 <0.03 <0.03   No results for input(s): PROBNP in the last 168 hours.   CHEMISTRY  Recent Labs Lab 05/17/17 1030 05/17/17 2315 05/19/17 0923 05/19/17 2025 05/20/17 0343 05/21/17 0328  NA 137 136 141 141 138  --   K 4.4 5.0 4.4 3.6 3.6  --   CL 100* 108 116* 109 109  --   CO2  --  24 23 24 24   --   GLUCOSE 121* 155* 103* 108* 120*  --   BUN 20 16 6  5* 6  --   CREATININE 0.80 0.76 0.56* 0.54* 0.71 0.63  CALCIUM  --  7.6* 7.8* 8.3* 7.5*  --   MG  --   --   --  1.5* 2.0 1.9  PHOS  --   --   --   --  2.9 3.3   Estimated Creatinine Clearance: 80.4 mL/min (by C-G formula based on SCr of 0.63 mg/dL).   LIVER  Recent Labs Lab 05/17/17 2315 05/20/17 1033  AST  --  22  ALT  --  19  ALKPHOS  --  53  BILITOT  --  0.7  PROT  --  5.7*  ALBUMIN  --  3.1*  INR 1.29  --      INFECTIOUS  Recent Labs Lab 05/20/17 1033 05/20/17 1107 05/21/17 0328  LATICACIDVEN  --  1.3  --   PROCALCITON <0.10  --  0.24     ENDOCRINE CBG (last 3)  No results for input(s): GLUCAP in the last 72 hours.       IMAGING x48h  - image(s) personally visualized   Dg Chest Port 1 View  Result Date: 05/20/2017 CLINICAL DATA:  Shortness of breath, cough for 2 days EXAM: PORTABLE CHEST 1 VIEW COMPARISON:  05/19/2017 FINDINGS: Elevated right hemidiaphragm. Bibasilar atelectasis. Mild cardiomegaly. No visible effusions or edema. No acute bony abnormality. IMPRESSION: Cardiomegaly.  Bibasilar atelectasis. Electronically Signed    By: Charlett NoseKevin  Dover M.D.   On: 05/20/2017 10:26   Dg Chest Port 1 View  Result Date: 05/19/2017 CLINICAL DATA:  Acute onset of hypoxia.  Initial encounter. EXAM: PORTABLE CHEST 1 VIEW COMPARISON:  Chest radiograph performed earlier today at 3:24 p.m. FINDINGS: The lungs are well-aerated. Right basilar airspace opacity raises concern for pneumonia. There is no evidence of pleural effusion or pneumothorax. The cardiomediastinal silhouette is mildly enlarged. No acute osseous abnormalities are seen. IMPRESSION: 1. Right basilar airspace opacity raises concern for pneumonia. 2. Mild cardiomegaly. Electronically Signed   By: Roanna RaiderJeffery  Chang M.D.   On: 05/19/2017 22:52   Dg Chest Port 1 View  Result Date: 05/19/2017 CLINICAL DATA:  Cough. EXAM: PORTABLE CHEST 1 VIEW COMPARISON:  05/18/2017 FINDINGS: 1524 hours. The cardio pericardial silhouette is enlarged. The lungs are clear wiithout focal pneumonia, edema, pneumothorax or pleural effusion. The visualized bony structures of the thorax are intact. Telemetry leads overlie the chest. IMPRESSION: Cardiomegaly without acute findings. Electronically Signed   By: Kennith CenterEric  Mansell M.D.   On: 05/19/2017 16:07   Reviewed 6/3 - bibasal atx, no clear infx    ASSESSMENT and PLAN  Acute respiratory failure with hypoxia- causes unclear, with fever and infiltrates being treated as hospital acquired pneumonia, but favor atelectasis especially right lower lobe, note that pro calcitonin is low and leukocytosis is marginal  -He is off BiPAP now, lower FiO2 on nasal cannula as tolerated, except saturations 90% and above -Simplify antibiotics to Zosyn alone and discontinue vancomycin and Bactrim -Encourage use of incentive spirometer and out of bed  Atrial fibrillation/RVR, now reverted to normal sinus rhythm -Per cardiology, anticoagulation on hold -Antihypertensives on hold  Hypotension has resolved Anemia related to acute blood loss status post TURP -goal hemoglobin  would be 8.0 and above   [facility], discharge end point will be resolution of hypoxia Can transfer out if remains on nasal cannula for 12 hours  Cyril Mourningakesh Tomiko Schoon MD. Tonny BollmanFCCP. [facility] Pager [phone] If no response call [phone]   05/21/2017

## 2017-05-22 ENCOUNTER — Inpatient Hospital Stay (HOSPITAL_COMMUNITY): Payer: Medicare Other

## 2017-05-22 ENCOUNTER — Telehealth: Payer: Self-pay | Admitting: Cardiovascular Disease

## 2017-05-22 DIAGNOSIS — J189 Pneumonia, unspecified organism: Secondary | ICD-10-CM

## 2017-05-22 LAB — BASIC METABOLIC PANEL
Anion gap: 9 (ref 5–15)
BUN: 7 mg/dL (ref 6–20)
CALCIUM: 7.8 mg/dL — AB (ref 8.9–10.3)
CO2: 24 mmol/L (ref 22–32)
CREATININE: 0.61 mg/dL (ref 0.61–1.24)
Chloride: 105 mmol/L (ref 101–111)
Glucose, Bld: 93 mg/dL (ref 65–99)
Potassium: 3.3 mmol/L — ABNORMAL LOW (ref 3.5–5.1)
Sodium: 138 mmol/L (ref 135–145)

## 2017-05-22 LAB — CBC
HCT: 23 % — ABNORMAL LOW (ref 39.0–52.0)
Hemoglobin: 7.8 g/dL — ABNORMAL LOW (ref 13.0–17.0)
MCH: 31.3 pg (ref 26.0–34.0)
MCHC: 33.9 g/dL (ref 30.0–36.0)
MCV: 92.4 fL (ref 78.0–100.0)
PLATELETS: 127 10*3/uL — AB (ref 150–400)
RBC: 2.49 MIL/uL — ABNORMAL LOW (ref 4.22–5.81)
RDW: 15 % (ref 11.5–15.5)
WBC: 4.1 10*3/uL (ref 4.0–10.5)

## 2017-05-22 LAB — PROCALCITONIN

## 2017-05-22 LAB — MAGNESIUM: MAGNESIUM: 1.8 mg/dL (ref 1.7–2.4)

## 2017-05-22 LAB — PHOSPHORUS: PHOSPHORUS: 3.1 mg/dL (ref 2.5–4.6)

## 2017-05-22 MED ORDER — POTASSIUM CHLORIDE CRYS ER 20 MEQ PO TBCR
40.0000 meq | EXTENDED_RELEASE_TABLET | Freq: Once | ORAL | Status: AC
  Administered 2017-05-22: 40 meq via ORAL
  Filled 2017-05-22: qty 2

## 2017-05-22 MED ORDER — DILTIAZEM HCL 30 MG PO TABS
30.0000 mg | ORAL_TABLET | Freq: Four times a day (QID) | ORAL | Status: DC
  Administered 2017-05-22 – 2017-05-25 (×12): 30 mg via ORAL
  Filled 2017-05-22 (×12): qty 1

## 2017-05-22 MED ORDER — AMIODARONE IV BOLUS ONLY 150 MG/100ML
150.0000 mg | Freq: Once | INTRAVENOUS | Status: AC
  Administered 2017-05-22: 150 mg via INTRAVENOUS
  Filled 2017-05-22: qty 100

## 2017-05-22 NOTE — Progress Notes (Signed)
Pharmacy Antibiotic Note  Drucilla ChaletMark I Buttrey is a 71 y.o. male admitted on 05/17/2017 for TURP.  He was admitted to ICU for hypotension post-op, however had improved.  Tonight he spiked a fever of 102F.  Complaints of cough noted, however CXR has been negative for PNA.  Pharmacy has been consulted for Vanc & Zosyn dosing. No leukocytosis.  Renal function has been stable at patient's baseline.   Today, 05/22/2017 Day #3 zosyn  Tm = 100.3  Renal: SCr WNL  WBC improved to WNL  PCT <0.1 --> 0.24 --> <0.1  Vancomycin stopped   Plan:  Zosyn 3.375gm IV q8h over 4h infusion remains appropriate dose  Do not expect need for dose adjustment, pharmacy to follow peripheally  If fever continues to improve, consider change to PO antibiotic, such as cefpodoxime or cefdinir   Height: 5\' 3"  (160 cm) Weight: 187 lb 9.8 oz (85.1 kg) IBW/kg (Calculated) : 56.9  Temp (24hrs), Avg:99.3 F (37.4 C), Min:98.1 F (36.7 C), Max:100.3 F (37.9 C)   Recent Labs Lab 05/18/17 1910 05/19/17 0923 05/19/17 1515 05/19/17 2025 05/20/17 0343 05/20/17 1107 05/21/17 0328 05/21/17 1025 05/22/17 0550  WBC 12.1* 6.6 5.1  --  12.9*  --   --   --  4.1  CREATININE  --  0.56*  --  0.54* 0.71  --  0.63  --  0.61  LATICACIDVEN  --   --   --   --   --  1.3  --   --   --   VANCOTROUGH  --   --   --   --   --   --   --  9*  --     Estimated Creatinine Clearance: 80.5 mL/min (by C-G formula based on SCr of 0.61 mg/dL).    Allergies  Allergen Reactions  . Iodinated Diagnostic Agents Hives and Other (See Comments)    Pt needs 13 hour pre med prior to scan.   Carlena Hurl. Xarelto [Rivaroxaban] Diarrhea    Antimicrobials this admission: 6/2 Vanc >> 6/4 6/2 Zosyn >>  5/31 Bactrim (started post TURP)>>6/4 5/31 Ancef x1   Dose adjustments this admission: 6/4 VT at 10a =  9 (on 1gm q12h)  Microbiology results: 5/31 MRSA PCR: negative 6/2 BCx x2: NGTD  Thank you for allowing pharmacy to be a part of this patient's  care.  Juliette Alcideustin Zeigler, PharmD, BCPS.   Pager: [phone] 05/22/2017 11:32 AM

## 2017-05-22 NOTE — Care Management Note (Signed)
Case Management Note  Patient Details  Name: Adrian Ramos MRN: [id number] Date of Birth: 04/29/1946  Subjective/Objective:  71 y/o m admitted w/Acute resp failure. From home. PT recc HHPT,HH aide,home rw. Await orders. Provided patient w/HHC listing for choice-await choice.                  Action/Plan:d/c home w/HHC/dme.   Expected Discharge Date:                  Expected Discharge Plan:  Home w Home Health Services  In-House Referral:     Discharge planning Services  CM Consult  Post Acute Care Choice:    Choice offered to:  Patient  DME Arranged:    DME Agency:     HH Arranged:    HH Agency:     Status of Service:  In process, will continue to follow  If discussed at Long Length of Stay Meetings, dates discussed:    Additional Comments:  Adrian Ramos, Adrian Slagel, RN 05/22/2017, 1:52 PM

## 2017-05-22 NOTE — Telephone Encounter (Signed)
Mr.Milby is calling because he want Dr. Duke Salviaandolph opinion on these 3 homehealth Agencie's : (1) [facility] -[phone]  (2)[facility] -[phone]  (3) [facility]( [facility] ) [phone]. Please call [phone] and the front desk is [phone].Marland Kitchen. Please call

## 2017-05-22 NOTE — Progress Notes (Signed)
Progress Note  Patient Name: Adrian Ramos Date of Encounter: 05/22/2017  Primary Cardiologist: Dr. Duke Salviaandolph  Subjective   Pt denies chest pain, but continues to feel short of breath with productive cough. He is maintaining ventricular rate in the 140s and feels fatigued.  Inpatient Medications    Scheduled Meds: . amiodarone  200 mg Oral q morning - 10a  . atorvastatin  40 mg Oral QPM  . famotidine  10 mg Oral QHS  . senna  1 tablet Oral BID  . traZODone  200 mg Oral QHS   Continuous Infusions: . diltiazem (CARDIZEM) infusion Stopped (05/20/17 0210)  . lactated ringers 10 mL/hr at 05/21/17 2140  . piperacillin-tazobactam (ZOSYN)  IV 3.375 g (05/22/17 0545)   PRN Meds: acetaminophen, diazepam, ondansetron, opium-belladonna, oxyCODONE, phenol   Vital Signs    Vitals:   05/21/17 1600 05/21/17 1838 05/21/17 2104 05/22/17 0502  BP:  136/80 125/65 110/78  Pulse: 73 90 88 (!) 126  Resp: (!) 21 18 20 18   Temp: 98.9 F (37.2 C) 100.3 F (37.9 C) 100 F (37.8 C) 99.2 F (37.3 C)  TempSrc: Oral Oral Oral Oral  SpO2: 93% 94% 92% 91%  Weight:    187 lb 9.8 oz (85.1 kg)  Height:        Intake/Output Summary (Last 24 hours) at 05/22/17 0745 Last data filed at 05/22/17 0700  Gross per 24 hour  Intake            750.5 ml  Output              510 ml  Net            240.5 ml   Filed Weights   05/17/17 0903 05/17/17 2100 05/22/17 0502  Weight: 184 lb (83.5 kg) 187 lb 6.3 oz (85 kg) 187 lb 9.8 oz (85.1 kg)     Physical Exam   General: Well developed, well nourished, male appearing in no acute distress. Head: Normocephalic, atraumatic.  Neck: Supple without bruits, no JVD. Lungs:  Resp regular, coarse sounds throughout with frequent productive cough Heart: irregular rate and rhythm, no murmur; no rub. Abdomen: Soft, non-tender, non-distended with normoactive bowel sounds. No hepatomegaly. No rebound/guarding. No obvious abdominal masses. Extremities: No clubbing,  cyanosis, no edema. Distal pedal pulses are 1+ bilaterally. Neuro: Alert and oriented X 3. Moves all extremities spontaneously. Psych: Normal affect.  Labs    Chemistry Recent Labs Lab 05/19/17 2025 05/20/17 0343 05/20/17 1033 05/21/17 0328 05/22/17 0550  NA 141 138  --   --  138  K 3.6 3.6  --   --  3.3*  CL 109 109  --   --  105  CO2 24 24  --   --  24  GLUCOSE 108* 120*  --   --  93  BUN 5* 6  --   --  7  CREATININE 0.54* 0.71  --  0.63 0.61  CALCIUM 8.3* 7.5*  --   --  7.8*  PROT  --   --  5.7*  --   --   ALBUMIN  --   --  3.1*  --   --   AST  --   --  22  --   --   ALT  --   --  19  --   --   ALKPHOS  --   --  53  --   --   BILITOT  --   --  0.7  --   --   GFRNONAA >60 >60  --  >60 >60  GFRAA >60 >60  --  >60 >60  ANIONGAP 8 5  --   --  9     Hematology Recent Labs Lab 05/19/17 1515 05/20/17 0343 05/22/17 0550  WBC 5.1 12.9* 4.1  RBC 2.74* 2.44* 2.49*  HGB 8.6* 7.8* 7.8*  HCT 25.0* 22.2* 23.0*  MCV 91.2 91.0 92.4  MCH 31.4 32.0 31.3  MCHC 34.4 35.1 33.9  RDW 15.4 15.2 15.0  PLT 101* 108* 127*    Cardiac Enzymes Recent Labs Lab 05/18/17 0533 05/18/17 1413 05/20/17 1033 05/21/17 0328  TROPONINI <0.03 <0.03 <0.03 <0.03   No results for input(s): TROPIPOC in the last 168 hours.   BNPNo results for input(s): BNP, PROBNP in the last 168 hours.   DDimer No results for input(s): DDIMER in the last 168 hours.   Radiology    Dg Chest Port 1 View  Result Date: 05/22/2017 CLINICAL DATA:  Respiratory failure, shortness of breath. EXAM: PORTABLE CHEST 1 VIEW COMPARISON:  Portable chest x-ray of May 20, 2017 FINDINGS: The patient is rotated on today's study. The lungs are well-expanded. There is no focal infiltrate. There is no pleural effusion. There is an approximately 3 mm calcified nodule in the left lower lung. The heart is mildly enlarged. The pulmonary vascularity is normal. The observed bony thorax is unremarkable. IMPRESSION: The lungs are mildly  hyperinflated which likely reflects underlying COPD or reactive airway disease. Mild cardiomegaly, stable. Overall there has not been significant interval change allowing for differences in patient positioning. Electronically Signed   By: David  [place] M.D.   On: 05/22/2017 07:19   Dg Chest Port 1 View  Result Date: 05/20/2017 CLINICAL DATA:  Shortness of breath, cough for 2 days EXAM: PORTABLE CHEST 1 VIEW COMPARISON:  05/19/2017 FINDINGS: Elevated right hemidiaphragm. Bibasilar atelectasis. Mild cardiomegaly. No visible effusions or edema. No acute bony abnormality. IMPRESSION: Cardiomegaly.  Bibasilar atelectasis. Electronically Signed   By: Charlett Nose M.D.   On: 05/20/2017 10:26     Telemetry    Atrial fibrillation with ventricular rates in the 140s - Personally Reviewed  ECG    05/21/17: sinus rhythm with a RBBB - Personally Reviewed   Cardiac Studies   2D echo 02/2017 Study Conclusions - Left ventricle: The cavity size was normal. Wall thickness was normal. Systolic function was normal. The estimated ejection fraction was in the range of 55% to 60%. Wall motion was normal; there were no regional wall motion abnormalities. Left ventricular diastolic function parameters were normal. - Left atrium: The atrium was mildly dilated. - Atrial septum: No defect or patent foramen ovale was identified. - Pulmonary arteries: Systolic pressure was mildly increased. PA peak pressure: 36 mm Hg (S).   CT coronary 10/16/16: IMPRESSION: 1. Coronary calcium score of 97. This was 30 percentile for age and sex matched control. 2. Normal coronary origin with right dominance. 3. Moderate CAD in the LAD and mild CAD in RCA. An aggressive risk factor modification is recommended. 4. Normal size pulmonary artery.   Myoview 10/15/16: IMPRESSION: 1. Large fixed defect in the infero septal region. No stress-induced ischemia. 2. Global hypokinesis. 3. Left ventricular ejection  fraction 36% 4. Non invasive risk stratification*: High risk, based on the large inferoseptal fixed defect and LV dilatation.  Patient Profile     71 y.o. male with past medical history of paroxysmal atrial fibrillation, nonobstructive coronary artery disease, alcohol abuse, prior  cardiomyopathy improved and now status post TURP for evaluation of paroxysmal atrial fibrillation. Cardiac CTA October 2017 showed moderate disease in the LAD and mild in the right coronary artery. Last echocardiogram March 2018 showed ejection fraction 55-60% and mild left atrial enlargement. Patient underwent TURP yesterday and postoperatively was noted to develop atrial fibrillation. Procedure complicated by bleeding and he was returned to the operating room for clot evacuation and cauterization. Patient was given beta blockade and converted to sinus rhythm.  Assessment & Plan    1. Chronic systolic and diastolic heart failure - recent echo with improved EF to 55-60% - lasix and losartan held for hypotension - patient is no volume overloaded one exam - he is overall net positive 17L - weight is 187 lbs from 184-187 lbs on admission   2. Paroxysmal Afib - he converted back to Afib RVR overnight, currently with rates in the 140-150s - he received an amiodarone bolus at 0100 today with a subsequent drop in his heart rate to the 30s; he recovered and remains tachycardic today - he previously did well on cardizem IV, started cardizem 30 mg q 6 hr for rate control; continue PO amiodarone - this is likely related to his pulmonary status - continues with frequent productive cough - will ask nursing to order flutter valve/aeorbeka  - anticoagulation still on hold for post-operative bleeding and suspected melena prior to surgery   3. Acute blood loss anemia - Hb 7.8, stable   4. Alcohol abuse - pt states he cut down on drinking since 09/2016 and only has a drink every 2 weeks   5. CAD - nonobstructive disease  per cardiact CTA and no ischemia on nuclear stress test (2017)   6. HTN - home losartan on hold - continue to hold while titrating rate-controlling agents - BP has been well-controlled   Signed, Marcelino Duster , PA-C 7:45 AM 05/22/2017 Pager: [phone]  History and all data above reviewed.  Patient examined.  I agree with the findings as above. He is now back in NSR.  He is anxious about going home because he is alone.  He was weak and dyspneic with ambulation.   The patient exam reveals COR:RRR  ,  Lungs: Decreased breath sounds at the bases  ,  Abd: Positive bowel sounds, no rebound no guarding, Ext No edema  .  All available labs, radiology testing, previous records reviewed. Agree with documented assessment and plan. Atrial fib:  Now back in NSR.  Continue PO amiodarone.  Holding anticoag for now given anemia and blood loss.  I will likely change back to Cozaar before discharge probably without Cardizem but I will decide this in the AM.    Rollene Rotunda  3:35 PM  05/22/2017

## 2017-05-22 NOTE — Progress Notes (Signed)
Cardiology notified that patient continues to go in and out of Afib with heart rate as high as 140's. New order received for Amiodarone IV. Will continue to monitor patient

## 2017-05-22 NOTE — Telephone Encounter (Signed)
[facility]

## 2017-05-22 NOTE — Care Management Note (Signed)
Case Management Note  Patient Details  Name: Drucilla ChaletMark I Hovis MRN: [id number] Date of Birth: 02/22/1946  Subjective/Objective:  Transfer from SDU. From home. Urology, Cardiology,Pulmonology following. PT cons-await recc.               Action/Plan:d/c plan home.   Expected Discharge Date:                  Expected Discharge Plan:  Home/Self Care  In-House Referral:     Discharge planning Services  CM Consult  Post Acute Care Choice:    Choice offered to:     DME Arranged:    DME Agency:     HH Arranged:    HH Agency:     Status of Service:  In process, will continue to follow  If discussed at Long Length of Stay Meetings, dates discussed:    Additional Comments:  Lanier ClamMahabir, Gawain Crombie, RN 05/22/2017, 11:35 AM

## 2017-05-22 NOTE — Care Management Important Message (Deleted)
Important Message  Patient Details  Name: Adrian Ramos Bolda MRN: [id number] Date of Birth: 06/20/1946   Medicare Important Message Given:  Yes    Caren MacadamFuller, Irlanda Croghan 05/22/2017, 1:16 PMImportant Message  Patient Details  Name: Adrian Ramos Tant MRN: [id number] Date of Birth: 09/13/1946   Medicare Important Message Given:  Yes    Caren MacadamFuller, Lan Entsminger 05/22/2017, 1:16 PM

## 2017-05-22 NOTE — Evaluation (Signed)
Physical Therapy Evaluation Patient Details Name: Adrian Ramos MRN: [id number] DOB: 1946/05/01 Today's Date: 05/22/2017   History of Present Illness  71 yo male s/p TURP, clot evacuation, cystoscopy 05/17/17. Post-op anemia, hypotension, acute respiratory failure.   Clinical Impression  Checked with RN prior to eval due to issues with HR. On eval, pt required Min guard assist for mobility. He walked ~25 feet while holding on to IV pole for some support. Distance limited by dyspnea and increased HR. At rest: 90% 2L O2, HR 140s. With activity: 87% 2L O2, 91% 3L O2, HR 150s. At end of session (seated): 90% 2L O2, HR 140s. Pt presents with general weakness, decreased activity tolerance, and impaired gait and balance. Discussed d/c plan-pt would like to return home with home health care arranged. Mentioned possibility that he may need to consider ST placement if he does not improve. Will follow and progress activity as tolerated.     Follow Up Recommendations Home health PT;Supervision - Intermittent; Home Health Aide, if possible    Equipment Recommendations  Rolling walker with 5" wheels (possibly, depending on progress)    Recommendations for Other Services OT consult     Precautions / Restrictions Precautions Precautions: Fall Precaution Comments: monitor HR and O2 sats Restrictions Weight Bearing Restrictions: No      Mobility  Bed Mobility               General bed mobility comments: oob in recliner  Transfers Overall transfer level: Needs assistance Equipment used: None Transfers: Sit to/from Stand Sit to Stand: Min guard         General transfer comment: close guard for safety  Ambulation/Gait Ambulation/Gait assistance: Min guard Ambulation Distance (Feet): 25 Feet Assistive device: Rolling walker (2 wheeled) Gait Pattern/deviations: Decreased stride length;Decreased step length - right;Decreased step length - left     General Gait Details: very slow gait  speed. Pt reported feeling like heart was racing. Deferred further ambulation. HR 150s, O2 sat 91% on 3L O2.   Stairs            Wheelchair Mobility    Modified Rankin (Stroke Patients Only)       Balance                                             Pertinent Vitals/Pain Pain Assessment: No/denies pain    Home Living Family/patient expects to be discharged to:: Private residence Living Arrangements: Alone   Type of Home: House Home Access: Stairs to enter   Secretary/administrator of Steps: 2 Home Layout: One level Home Equipment: Cane - single point      Prior Function Level of Independence: Independent               Hand Dominance        Extremity/Trunk Assessment   Upper Extremity Assessment Upper Extremity Assessment: Overall WFL for tasks assessed    Lower Extremity Assessment Lower Extremity Assessment: Generalized weakness    Cervical / Trunk Assessment Cervical / Trunk Assessment: Normal  Communication   Communication: No difficulties  Cognition Arousal/Alertness: Awake/alert Behavior During Therapy: WFL for tasks assessed/performed Overall Cognitive Status: Within Functional Limits for tasks assessed  General Comments      Exercises     Assessment/Plan    PT Assessment Patient needs continued PT services  PT Problem List Decreased activity tolerance;Decreased mobility;Decreased strength;Cardiopulmonary status limiting activity       PT Treatment Interventions Gait training;Therapeutic activities;Therapeutic exercise;Patient/family education;Functional mobility training;Balance training    PT Goals (Current goals can be found in the Care Plan section)  Acute Rehab PT Goals Patient Stated Goal: regain PLOF PT Goal Formulation: With patient Time For Goal Achievement: 06/05/17 Potential to Achieve Goals: Good    Frequency Min 3X/week   Barriers to  discharge        Co-evaluation               AM-PAC PT "6 Clicks" Daily Activity  Outcome Measure Difficulty turning over in bed (including adjusting bedclothes, sheets and blankets)?: A Little Difficulty moving from lying on back to sitting on the side of the bed? : A Little Difficulty sitting down on and standing up from a chair with arms (e.g., wheelchair, bedside commode, etc,.)?: A Little Help needed moving to and from a bed to chair (including a wheelchair)?: A Little Help needed walking in hospital room?: A Little Help needed climbing 3-5 steps with a railing? : A Little 6 Click Score: 18    End of Session Equipment Utilized During Treatment: Oxygen Activity Tolerance: Patient limited by fatigue Patient left: in chair;with call bell/phone within reach   PT Visit Diagnosis: Muscle weakness (generalized) (M62.81);Difficulty in walking, not elsewhere classified (R26.2)    Time: [id number] PT Time Calculation (min) (ACUTE ONLY): 20 min   Charges:   PT Evaluation $PT Eval Low Complexity: 1 Procedure     PT G Codes:          Adrian Ramos, MPT Pager: [phone]

## 2017-05-22 NOTE — Progress Notes (Signed)
Name: Adrian Ramos MRN: [id number] DOB: [birth year]    ADMISSION DATE:  05/17/2017 CONSULTATION DATE:  05/17/2017  REFERRING MD :  Dr. Retta Diones [facility]  CHEF COMPLAINT:  Hypotension post TURP   brief:  71 year old man status post elective TURP on 5/31, developed postoperative hypotension, atrial fibrillation/RVR and respiratory distress requiring BiPAP  PMH -PAF (on ASA only), and CHF (likely due to AF or ETOH abuse, EF now recovered as he has largely stopped drinking).    SIGNIFICANT EVENTS  5/31 bleeding after TURP requiring blood products and pressors. Admit to ICU 6/2 -  Off pressors. Having new dru cough. Using IS. Feeling better overall though./No active bleeding 6/2 -  febrile  at 8pm and cultured and started on broad abx .  AFib RVR  and got amio bolus at 10pm and started on po amio  And since then worsening o2 need.>> bipap   SUBJECTIVE/OVERNIGHT/INTERVAL HX  Developed a low-grade temperature of transferred to the floor and went back into A. fib RVR-acquired amio bolus overnight  Breathing improving but cough more productive  VITAL SIGNS: Temp:  [98.1 F (36.7 C)-100.3 F (37.9 C)] 99.2 F (37.3 C) (06/05 0502) Pulse Rate:  [71-140] 138 (06/05 0810) Resp:  [17-33] 18 (06/05 0502) BP: (110-140)/(30-80) 110/78 (06/05 0502) SpO2:  [83 %-100 %] 93 % (06/05 0810) Weight:  [187 lb 9.8 oz (85.1 kg)] 187 lb 9.8 oz (85.1 kg) (06/05 0502)                                                          EXAM  Gen. Pleasant, well-nourished, in no distress, on [place], anxious affect ENT - no lesions, no post nasal drip Neck: No JVD, no thyromegaly, no carotid bruits Lungs: no use of accessory muscles, no dullness to percussion,coarse  BS Rt without rales or rhonchi  Cardiovascular: Rhythm regular, heart sounds  normal, no murmurs or gallops, no peripheral edema Abdomen: soft and non-tender, no hepatosplenomegaly, BS normal. Musculoskeletal: No deformities, no  cyanosis or clubbing, RUE forearm erythema & mild swelling  Neuro:  alert, non focal   LABS  PULMONARY  Recent Labs Lab 05/17/17 1030  TCO2 28    CBC  Recent Labs Lab 05/19/17 1515 05/20/17 0343 05/22/17 0550  HGB 8.6* 7.8* 7.8*  HCT 25.0* 22.2* 23.0*  WBC 5.1 12.9* 4.1  PLT 101* 108* 127*    COAGULATION  Recent Labs Lab 05/17/17 2315  INR 1.29    CARDIAC    Recent Labs Lab 05/17/17 2315 05/18/17 0533 05/18/17 1413 05/20/17 1033 05/21/17 0328  TROPONINI <0.03 <0.03 <0.03 <0.03 <0.03   No results for input(s): PROBNP in the last 168 hours.   CHEMISTRY  Recent Labs Lab 05/17/17 2315 05/19/17 2130 05/19/17 2025 05/20/17 0343 05/21/17 0328 05/22/17 0550  NA 136 141 141 138  --  138  K 5.0 4.4 3.6 3.6  --  3.3*  CL 108 116* 109 109  --  105  CO2 24 23 24 24   --  24  GLUCOSE 155* 103* 108* 120*  --  93  BUN 16 6 5* 6  --  7  CREATININE 0.76 0.56* 0.54* 0.71 0.63 0.61  CALCIUM 7.6* 7.8* 8.3* 7.5*  --  7.8*  MG  --   --  1.5* 2.0  1.9 1.8  PHOS  --   --   --  2.9 3.3 3.1   Estimated Creatinine Clearance: 80.5 mL/min (by C-G formula based on SCr of 0.61 mg/dL).   LIVER  Recent Labs Lab 05/17/17 2315 05/20/17 1033  AST  --  22  ALT  --  19  ALKPHOS  --  53  BILITOT  --  0.7  PROT  --  5.7*  ALBUMIN  --  3.1*  INR 1.29  --      INFECTIOUS  Recent Labs Lab 05/20/17 1033 05/20/17 1107 05/21/17 0328 05/22/17 0550  LATICACIDVEN  --  1.3  --   --   PROCALCITON <0.10  --  0.24 <0.10     ENDOCRINE CBG (last 3)  No results for input(s): GLUCAP in the last 72 hours.       IMAGING x48h  - image(s) personally visualized   Dg Chest Port 1 View  Result Date: 05/22/2017 CLINICAL DATA:  Respiratory failure, shortness of breath. EXAM: PORTABLE CHEST 1 VIEW COMPARISON:  Portable chest x-ray of May 20, 2017 FINDINGS: The patient is rotated on today's study. The lungs are well-expanded. There is no focal infiltrate. There is no  pleural effusion. There is an approximately 3 mm calcified nodule in the left lower lung. The heart is mildly enlarged. The pulmonary vascularity is normal. The observed bony thorax is unremarkable. IMPRESSION: The lungs are mildly hyperinflated which likely reflects underlying COPD or reactive airway disease. Mild cardiomegaly, stable. Overall there has not been significant interval change allowing for differences in patient positioning. Electronically Signed   By: David  SwazilandJordan M.D.   On: 05/22/2017 07:19   Dg Chest Port 1 View  Result Date: 05/20/2017 CLINICAL DATA:  Shortness of breath, cough for 2 days EXAM: PORTABLE CHEST 1 VIEW COMPARISON:  05/19/2017 FINDINGS: Elevated right hemidiaphragm. Bibasilar atelectasis. Mild cardiomegaly. No visible effusions or edema. No acute bony abnormality. IMPRESSION: Cardiomegaly.  Bibasilar atelectasis. Electronically Signed   By: Charlett NoseKevin  Dover M.D.   On: 05/20/2017 10:26   Reviewed 6/5 -  no clear infx    ASSESSMENT and PLAN  Acute respiratory failure with hypoxia- causes unclear, with fever and infiltrates being treated as hospital acquired pneumonia, but favor atelectasis/ bronchitis ? aspiration pneumonitis, note that pro calcitonin is low and leukocytosis is marginal but fever persists  - lower FiO2 on nasal cannula as tolerated, accept saturations 90% and above -ct Zosyn alone and discontinue vancomycin and Bactrim -Encourage use of incentive spirometer and out of bed  Atrial fibrillation/RVR -Per cardiology, Cardizem being titrated anticoagulation on hold -Antihypertensives on hold -Hypokalemia being repleted  Hypotension has resolved Anemia related to acute blood loss status post TURP -transfuse for hemoglobin 7 or lower   Cyril Mourningakesh Alva MD. FCCP. [facility] Pager [phone] If no response call [phone]   05/22/2017

## 2017-05-22 NOTE — Care Management Important Message (Signed)
Important Message  Patient Details  Name: Adrian Ramos Greb MRN: [id number] Date of Birth: 12/24/1945   Medicare Important Message Given:  Yes    Caren MacadamFuller, Vianka Ertel 05/22/2017, 12:36 PMImportant Message  Patient Details  Name: Adrian Ramos Karstens MRN: [id number] Date of Birth: 12/22/1945   Medicare Important Message Given:  Yes    Caren MacadamFuller, Shaquanda Graves 05/22/2017, 12:36 PM

## 2017-05-23 LAB — BASIC METABOLIC PANEL
ANION GAP: 6 (ref 5–15)
BUN: 6 mg/dL (ref 6–20)
CO2: 27 mmol/L (ref 22–32)
Calcium: 7.7 mg/dL — ABNORMAL LOW (ref 8.9–10.3)
Chloride: 109 mmol/L (ref 101–111)
Creatinine, Ser: 0.67 mg/dL (ref 0.61–1.24)
Glucose, Bld: 107 mg/dL — ABNORMAL HIGH (ref 65–99)
POTASSIUM: 3.9 mmol/L (ref 3.5–5.1)
SODIUM: 142 mmol/L (ref 135–145)

## 2017-05-23 LAB — CBC
HCT: 21.7 % — ABNORMAL LOW (ref 39.0–52.0)
Hemoglobin: 7.2 g/dL — ABNORMAL LOW (ref 13.0–17.0)
MCH: 31.4 pg (ref 26.0–34.0)
MCHC: 33.2 g/dL (ref 30.0–36.0)
MCV: 94.8 fL (ref 78.0–100.0)
PLATELETS: 128 10*3/uL — AB (ref 150–400)
RBC: 2.29 MIL/uL — AB (ref 4.22–5.81)
RDW: 15.3 % (ref 11.5–15.5)
WBC: 3.7 10*3/uL — AB (ref 4.0–10.5)

## 2017-05-23 LAB — PHOSPHORUS: PHOSPHORUS: 3.3 mg/dL (ref 2.5–4.6)

## 2017-05-23 LAB — MAGNESIUM: Magnesium: 2.1 mg/dL (ref 1.7–2.4)

## 2017-05-23 MED ORDER — FUROSEMIDE 10 MG/ML IJ SOLN
40.0000 mg | Freq: Two times a day (BID) | INTRAMUSCULAR | Status: AC
Start: 2017-05-23 — End: 2017-05-23
  Administered 2017-05-23 (×2): 40 mg via INTRAVENOUS
  Filled 2017-05-23 (×2): qty 4

## 2017-05-23 MED ORDER — LEVALBUTEROL HCL 0.63 MG/3ML IN NEBU: 0.6300 mg | INHALATION_SOLUTION | Freq: Four times a day (QID) | RESPIRATORY_TRACT | Status: DC | PRN

## 2017-05-23 MED ORDER — POTASSIUM CHLORIDE CRYS ER 20 MEQ PO TBCR
20.0000 meq | EXTENDED_RELEASE_TABLET | Freq: Two times a day (BID) | ORAL | Status: AC
  Administered 2017-05-23 (×2): 20 meq via ORAL
  Filled 2017-05-23 (×2): qty 1

## 2017-05-23 NOTE — Progress Notes (Signed)
Patient converted to afib around 1900. HR in the 120's-140's. MD on call notified. Patient not having any symptoms. Patient had taken cardiazem earlier. Patient converted back to SR in the 80's at 2020. No new orders. Will continue to closely monitor patient. Setzer, Don BroachAllison Marie

## 2017-05-23 NOTE — Progress Notes (Signed)
6 Days Post-Op Subjective: Patient reports feeling better. Voiding well. No hematuria.  Objective: Vital signs in last 24 hours: Temp:  [98.3 F (36.8 C)-99.3 F (37.4 C)] 98.3 F (36.8 C) (06/06 1349) Pulse Rate:  [64-119] 77 (06/06 1349) Resp:  [18] 18 (06/06 1349) BP: (107-115)/(54-63) 113/57 (06/06 1349) SpO2:  [90 %-97 %] 90 % (06/06 1349) Weight:  [85.9 kg (189 lb 6.4 oz)] 85.9 kg (189 lb 6.4 oz) (06/06 0612)  Intake/Output from previous day: 06/05 0701 - 06/06 0700 In: 1180 [P.O.:840; I.V.:190; IV Piggyback:150] Out: 575 [Urine:575] Intake/Output this shift: Total I/O In: 530 [P.O.:480; IV Piggyback:50] Out: 1125 [Urine:1125]  Physical Exam:  Constitutional: Vital signs reviewed. WD WN in NAD   Eyes: PERRL, No scleral icterus.   Pulmonary/Chest: Normal effort Abdominal: Soft. Non-tender, non-distended, bowel sounds are normal, no masses, organomegaly, or guarding present.  Extremities: No cyanosis or edema   Lab Results:  Recent Labs  05/22/17 0550 05/23/17 0526  HGB 7.8* 7.2*  HCT 23.0* 21.7*   BMET  Recent Labs  05/22/17 0550 05/23/17 0526  NA 138 142  K 3.3* 3.9  CL 105 109  CO2 24 27  GLUCOSE 93 107*  BUN 7 6  CREATININE 0.61 0.67  CALCIUM 7.8* 7.7*   No results for input(s): LABPT, INR in the last 72 hours. No results for input(s): LABURIN in the last 72 hours. Results for orders placed or performed during the hospital encounter of 05/17/17  MRSA PCR Screening     Status: None   Collection Time: 05/17/17 11:06 PM  Result Value Ref Range Status   MRSA by PCR NEGATIVE NEGATIVE Final    Comment:        The GeneXpert MRSA Assay (FDA approved for NASAL specimens only), is one component of a comprehensive MRSA colonization surveillance program. It is not intended to diagnose MRSA infection nor to guide or monitor treatment for MRSA infections.   Culture, blood (Routine X 2) w Reflex to ID Panel     Status: None (Preliminary result)   Collection Time: 05/19/17  8:25 PM  Result Value Ref Range Status   Specimen Description BLOOD LEFT HAND  Final   Special Requests IN PEDIATRIC BOTTLE Blood Culture adequate volume  Final   Culture   Final    NO GROWTH 3 DAYS Performed at [facility], [address], [place], [place] [address]    Report Status PENDING  Incomplete  Culture, blood (Routine X 2) w Reflex to ID Panel     Status: None (Preliminary result)   Collection Time: 05/19/17  8:25 PM  Result Value Ref Range Status   Specimen Description BLOOD LEFT HAND  Final   Special Requests   Final    BOTTLES DRAWN AEROBIC ONLY Blood Culture adequate volume   Culture   Final    NO GROWTH 3 DAYS Performed at [facility], [address], [place], [place] [address]    Report Status PENDING  Incomplete    Studies/Results: Dg Chest Port 1 View  Result Date: 05/22/2017 CLINICAL DATA:  Respiratory failure, shortness of breath. EXAM: PORTABLE CHEST 1 VIEW COMPARISON:  Portable chest x-ray of May 20, 2017 FINDINGS: The patient is rotated on today's study. The lungs are well-expanded. There is no focal infiltrate. There is no pleural effusion. There is an approximately 3 mm calcified nodule in the left lower lung. The heart is mildly enlarged. The pulmonary vascularity is normal. The observed bony thorax is  unremarkable. IMPRESSION: The lungs are mildly hyperinflated which likely reflects underlying COPD or reactive airway disease. Mild cardiomegaly, stable. Overall there has not been significant interval change allowing for differences in patient positioning. Electronically Signed   By: David  SwazilandJordan M.D.   On: 05/22/2017 07:19    Assessment/Plan:   S/P TURP w/ posop blleed/OR takeback. Urine now clear, voiding well.  OK to r/s ASA at any point  I'm OK w/ d/c at any point--tomorrow if OK w/ Pulm/Cards.  No need for transfusion from my standpoint   LOS: 5 days   Marcine MatarDAHLSTEDT, Regie Bunner M 05/23/2017, 5:00 PM

## 2017-05-23 NOTE — Progress Notes (Signed)
SATURATION QUALIFICATIONS: (This note is used to comply with regulatory documentation for home oxygen)  Patient Saturations on Room Air at Rest = 88  Patient Saturations on Room Air while Ambulating = 84  Patient Saturations on 2 Liters of oxygen while Ambulating = 88  Please briefly explain why patient needs home oxygen: Pt complained of SOB during ambulation. 

## 2017-05-23 NOTE — Progress Notes (Signed)
Progress Note  Patient Name: Adrian Ramos Date of Encounter: 05/23/2017  Primary Cardiologist: Dr. Duke Salviaandolph  Subjective   Pt denies chest pain, but continues to feel short of breath with productive cough. He is maintaining NSR this am  Inpatient Medications    Scheduled Meds: . amiodarone  200 mg Oral q morning - 10a  . atorvastatin  40 mg Oral QPM  . diltiazem  30 mg Oral Q6H  . famotidine  10 mg Oral QHS  . senna  1 tablet Oral BID  . traZODone  200 mg Oral QHS   Continuous Infusions: . lactated ringers 10 mL/hr at 05/21/17 2140  . piperacillin-tazobactam (ZOSYN)  IV Stopped (05/23/17 0947)   PRN Meds: acetaminophen, diazepam, ondansetron, opium-belladonna, oxyCODONE, phenol   Vital Signs    Vitals:   05/22/17 0810 05/22/17 1314 05/22/17 2103 05/23/17 0612  BP:  117/86 107/63 (!) 115/54  Pulse: (!) 138 (!) 105 (!) 119 64  Resp:  18 18 18   Temp:  99.2 F (37.3 C) 99.3 F (37.4 C) 99.2 F (37.3 C)  TempSrc:  Oral Oral Oral  SpO2: 93% 96% 97% 97%  Weight:    189 lb 6.4 oz (85.9 kg)  Height:        Intake/Output Summary (Last 24 hours) at 05/23/17 1015 Last data filed at 05/23/17 0940  Gross per 24 hour  Intake             1180 ml  Output              875 ml  Net              305 ml   Filed Weights   05/17/17 2100 05/22/17 0502 05/23/17 0612  Weight: 187 lb 6.3 oz (85 kg) 187 lb 9.8 oz (85.1 kg) 189 lb 6.4 oz (85.9 kg)     Physical Exam   General: Well developed, well nourished, male pale, appearing mild SOB at rest Head: Normocephalic, atraumatic.  Neck: Supple without bruits, no JVD. Lungs:  Diffuse rales and expiratory wheezing Heart: RRR no murmur; no rub. Abdomen: Soft, non-tender, non-distended  Extremities: No clubbing, cyanosis, no edema. Distal pedal pulses are 1+ bilaterally. Neuro: Alert and oriented X 3. Moves all extremities spontaneously. Psych: Normal affect.  Labs    Chemistry  Recent Labs Lab 05/20/17 0343 05/20/17 1033  05/21/17 0328 05/22/17 0550 05/23/17 0526  NA 138  --   --  138 142  K 3.6  --   --  3.3* 3.9  CL 109  --   --  105 109  CO2 24  --   --  24 27  GLUCOSE 120*  --   --  93 107*  BUN 6  --   --  7 6  CREATININE 0.71  --  0.63 0.61 0.67  CALCIUM 7.5*  --   --  7.8* 7.7*  PROT  --  5.7*  --   --   --   ALBUMIN  --  3.1*  --   --   --   AST  --  22  --   --   --   ALT  --  19  --   --   --   ALKPHOS  --  53  --   --   --   BILITOT  --  0.7  --   --   --   GFRNONAA >60  --  >60 >60 >60  GFRAA >  60  --  >60 >60 >60  ANIONGAP 5  --   --  9 6     Hematology  Recent Labs Lab 05/20/17 0343 05/22/17 0550 05/23/17 0526  WBC 12.9* 4.1 3.7*  RBC 2.44* 2.49* 2.29*  HGB 7.8* 7.8* 7.2*  HCT 22.2* 23.0* 21.7*  MCV 91.0 92.4 94.8  MCH 32.0 31.3 31.4  MCHC 35.1 33.9 33.2  RDW 15.2 15.0 15.3  PLT 108* 127* 128*    Cardiac Enzymes  Recent Labs Lab 05/18/17 0533 05/18/17 1413 05/20/17 1033 05/21/17 0328  TROPONINI <0.03 <0.03 <0.03 <0.03   No results for input(s): TROPIPOC in the last 168 hours.   BNPNo results for input(s): BNP, PROBNP in the last 168 hours.   DDimer No results for input(s): DDIMER in the last 168 hours.   Radiology    Dg Chest Port 1 View  Result Date: 05/22/2017 CLINICAL DATA:  Respiratory failure, shortness of breath. EXAM: PORTABLE CHEST 1 VIEW COMPARISON:  Portable chest x-ray of May 20, 2017 FINDINGS: The patient is rotated on today's study. The lungs are well-expanded. There is no focal infiltrate. There is no pleural effusion. There is an approximately 3 mm calcified nodule in the left lower lung. The heart is mildly enlarged. The pulmonary vascularity is normal. The observed bony thorax is unremarkable. IMPRESSION: The lungs are mildly hyperinflated which likely reflects underlying COPD or reactive airway disease. Mild cardiomegaly, stable. Overall there has not been significant interval change allowing for differences in patient positioning.  Electronically Signed   By: David  [place] M.D.   On: 05/22/2017 07:19     Telemetry    NSR-68  ECG    05/21/17: sinus rhythm with a RBBB - Personally Reviewed   Cardiac Studies   2D echo 02/2017 Study Conclusions - Left ventricle: The cavity size was normal. Wall thickness was normal. Systolic function was normal. The estimated ejection fraction was in the range of 55% to 60%. Wall motion was normal; there were no regional wall motion abnormalities. Left ventricular diastolic function parameters were normal. - Left atrium: The atrium was mildly dilated. - Atrial septum: No defect or patent foramen ovale was identified. - Pulmonary arteries: Systolic pressure was mildly increased. PA peak pressure: 36 mm Hg (S).   CT coronary 10/16/16: IMPRESSION: 1. Coronary calcium score of 97. This was 91 percentile for age and sex matched control. 2. Normal coronary origin with right dominance. 3. Moderate CAD in the LAD and mild CAD in RCA. An aggressive risk factor modification is recommended. 4. Normal size pulmonary artery.   Myoview 10/15/16: IMPRESSION: 1. Large fixed defect in the infero septal region. No stress-induced ischemia. 2. Global hypokinesis. 3. Left ventricular ejection fraction 36% 4. Non invasive risk stratification*: High risk, based on the large inferoseptal fixed defect and LV dilatation.  Patient Profile     71 y.o. 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 Post op pt developed PAF-cardiology consulted.   Assessment & Plan   1. Acute on chronic diastolic CHF - His I/O is + 18L, probably from aggressive hydration for post op hypotension.   2. Paroxysmal Afib - he converted back to NSR, back on  PO Amiodarone - anticoagulation still on hold for post-operative bleeding and suspected  melena prior to surgery   3. Acute blood loss anemia - Hb 7.2, dropping- this may be dilutional as well   4. Alcohol abuse - pt states he cut down on drinking since 09/2016 and only has a drink every 2 weeks   5. CAD - nonobstructive disease per cardiact CTA and no ischemia on nuclear stress test (2017)   6. HTN - home losartan on hold - continue to hold while titrating rate-controlling agents - BP has been well-controlled  Plan: IV Lasix 40 mg BID x 2 doses with K+ 20 meq x 2 doses today then decide further based on response. He was on Lasix 40 mg daily prior to admission.   Add Xopenex HHN as he most likely has COPD component (long time smoker).    Adrian Ramos , [place] 10:15 AM 05/23/2017 Pager: [phone]  History and all data above reviewed.  Patient examined.  I agree with the findings as above.  No pain.  He remains SOB.  Maintaining NSR.  The patient exam reveals COR:RRR  ,  Lungs: Decreased breath sounds with coarse crackles.    ,  Abd: Positive bowel sounds, no rebound no guarding, Ext Mild ankle edema  .  All available labs, radiology testing, previous records reviewed. Agree with documented assessment and plan. Acute on chronic diastolic HF:  IV Lasix today with potassium supplementation.  Needs good pulmonary toilet.    Adrian Ramos  1:08 PM  05/23/2017

## 2017-05-23 NOTE — Progress Notes (Signed)
Report received from A. [place]abe[place] ,RN. No change from initial pm assessment. Will continue to monitor and follow the POC.  

## 2017-05-23 NOTE — Progress Notes (Signed)
Physical Therapy Treatment Patient Details Name: Adrian Ramos MRN: [id number] DOB: [birth year] Today's Date: 05/23/2017    History of Present Illness 71 yo male s/p TURP, clot evacuation, cystoscopy 05/17/17. Post-op anemia, hypotension, acute respiratory failure.     PT Comments    Progressing slowly with mobility. Pt walked ~55 feet x 2 with the rollator. O2 sats: 91% on 1L O2 at rest, 85% on 1L O2 during ambulation, 88% on 2L O2 during ambulation. Will continue to follow and progress activity as tolerated.    Follow Up Recommendations  Home health PT;Supervision - Intermittent; Home health Aide     Equipment Recommendations   4 wheeled rolling walker   Recommendations for Other Services       Precautions / Restrictions Precautions Precautions: Fall Precaution Comments: monitor HR and O2 sats Restrictions Weight Bearing Restrictions: No    Mobility  Bed Mobility               General bed mobility comments: oob in recliner  Transfers Overall transfer level: Needs assistance Equipment used: 4-wheeled walker Transfers: Sit to/from Stand Sit to Stand: Supervision         General transfer comment: for safety and safe operation of rollator  Ambulation/Gait Ambulation/Gait assistance: Supervision Ambulation Distance (Feet): 55 Feet (x2) Assistive device: 4-wheeled walker Gait Pattern/deviations: Step-through pattern;Decreased stride length;Decreased step length - right;Decreased step length - left     General Gait Details: slow gait speed. O2 sats 85% on 1L O2, 88% on 2L O2 during ambulation. HR 80s-90s. Dyspnea 2/4.   Stairs            Wheelchair Mobility    Modified Rankin (Stroke Patients Only)       Balance                                            Cognition Arousal/Alertness: Awake/alert Behavior During Therapy: WFL for tasks assessed/performed Overall Cognitive Status: Within Functional Limits for tasks assessed                                        Exercises      General Comments        Pertinent Vitals/Pain Pain Assessment: No/denies pain    Home Living                      Prior Function            PT Goals (current goals can now be found in the care plan section) Progress towards PT goals: Progressing toward goals    Frequency    Min 3X/week      PT Plan Current plan remains appropriate    Co-evaluation              AM-PAC PT "6 Clicks" Daily Activity  Outcome Measure  Difficulty turning over in bed (including adjusting bedclothes, sheets and blankets)?: A Little Difficulty moving from lying on back to sitting on the side of the bed? : A Little Difficulty sitting down on and standing up from a chair with arms (e.g., wheelchair, bedside commode, etc,.)?: A Little Help needed moving to and from a bed to chair (including a wheelchair)?: A Little Help needed walking in hospital room?: A Little Help needed climbing  3-5 steps with a railing? : A Little 6 Click Score: 18    End of Session Equipment Utilized During Treatment: Oxygen Activity Tolerance: Patient tolerated treatment well Patient left: in chair;with call bell/phone within reach   PT Visit Diagnosis: Muscle weakness (generalized) (M62.81);Difficulty in walking, not elsewhere classified (R26.2)     Time: [id number] PT Time Calculation (min) (ACUTE ONLY): 22 min  Charges:  $Gait Training: 8-22 mins                    G Codes:          Rebeca AlertJannie Konnor Jorden, MPT Pager: [phone]

## 2017-05-23 NOTE — Care Management Note (Signed)
Case Management Note  Patient Details  Name: Adrian Ramos MRN: [id number] Date of Birth: 01/05/1946  Subjective/Objective:  [facility]rep Kim aware & following. Also home rw, & home 02(await home 02 orders)& delivery to rm.                 Action/Plan:d/c home w/HHC/DME.   Expected Discharge Date:                  Expected Discharge Plan:  Home w Home Health Services  In-House Referral:     Discharge planning Services  CM Consult  Post Acute Care Choice:    Choice offered to:  Patient  DME Arranged:  Walker rolling with seat, Oxygen DME Agency:  [facility]  HH Arranged:  RN, PT, OT, Nurse's Aide, Social Work [facility]  Status of Service:  In process, will continue to follow  If discussed at Long Length of Stay Meetings, dates discussed:    Additional Comments:  Lanier ClamMahabir, Gleason Ardoin, RN 05/23/2017, 2:39 PM

## 2017-05-23 NOTE — Progress Notes (Signed)
Name: Adrian Ramos MRN: [id number] DOB: 11/10/1946    ADMISSION DATE:  05/17/2017 CONSULTATION DATE:  05/17/2017  REFERRING MD :  Dr. Retta Diones[facility]  CHEF COMPLAINT:  Hypotension post TURP   brief:  71 year old man status post elective TURP on 5/31, developed postoperative hypotension, atrial fibrillation/RVR and respiratory distress requiring BiPAP  PMH -PAF (on ASA only), and CHF (likely due to AF or ETOH abuse, EF now recovered as he has largely stopped drinking).    SIGNIFICANT EVENTS  5/31 bleeding after TURP requiring blood products and pressors. Admit to ICU 6/2 -  Off pressors. Having new dru cough. Using IS. Feeling better overall though./No active bleeding 6/2 -  febrile  at 8pm and cultured and started on broad abx .  AFib RVR  and got amio bolus at 10pm and started on po amio  And since then worsening o2 need.>> bipap 6/5 Developed a low-grade temperature of transferred to the floor and went back into A. fib RVR-acquired amio bolus overnight   SUBJECTIVE/OVERNIGHT/INTERVAL HX Breathing continues to improve, is down to 1 L nasal cannula Denies chest pain As stated normal sinus rhythm overnight Afebrile  VITAL SIGNS: Temp:  [99.2 F (37.3 C)-99.3 F (37.4 C)] 99.2 F (37.3 C) (06/06 0612) Pulse Rate:  [64-119] 64 (06/06 0612) Resp:  [18] 18 (06/06 0612) BP: (107-117)/(54-86) 115/54 (06/06 0612) SpO2:  [96 %-97 %] 97 % (06/06 0612) Weight:  [189 lb 6.4 oz (85.9 kg)] 189 lb 6.4 oz (85.9 kg) (06/06 0612)                                                          EXAM  Gen. Pleasant, well-nourished, in no distress, on [place], anxious affect ENT - no lesions, no post nasal drip Neck: No JVD, no thyromegaly, no carotid bruits Lungs: no use of accessory muscles, no dullness to percussion,coarse  BS BL without rales or rhonchi  Cardiovascular: Rhythm regular, heart sounds  normal, no murmurs or gallops, no peripheral edema Abdomen: soft and  non-tender, no hepatosplenomegaly, BS normal. Musculoskeletal: No deformities, no cyanosis or clubbing, RUE forearm erythema & mild swelling  Neuro:  alert, non focal   LABS  PULMONARY  Recent Labs Lab 05/17/17 1030  TCO2 28    CBC  Recent Labs Lab 05/20/17 0343 05/22/17 0550 05/23/17 0526  HGB 7.8* 7.8* 7.2*  HCT 22.2* 23.0* 21.7*  WBC 12.9* 4.1 3.7*  PLT 108* 127* 128*    COAGULATION  Recent Labs Lab 05/17/17 2315  INR 1.29    CARDIAC    Recent Labs Lab 05/17/17 2315 05/18/17 0533 05/18/17 1413 05/20/17 1033 05/21/17 0328  TROPONINI <0.03 <0.03 <0.03 <0.03 <0.03   No results for input(s): PROBNP in the last 168 hours.   CHEMISTRY  Recent Labs Lab 05/19/17 [id number] 05/19/17 2025 05/20/17 0343 05/21/17 0328 05/22/17 0550 05/23/17 0526  NA 141 141 138  --  138 142  K 4.4 3.6 3.6  --  3.3* 3.9  CL 116* 109 109  --  105 109  CO2 23 24 24   --  24 27  GLUCOSE 103* 108* 120*  --  93 107*  BUN 6 5* 6  --  7 6  CREATININE 0.56* 0.54* 0.71 0.63 0.61 0.67  CALCIUM 7.8* 8.3* 7.5*  --  7.8* 7.7*  MG  --  1.5* 2.0 1.9 1.8 2.1  PHOS  --   --  2.9 3.3 3.1 3.3   Estimated Creatinine Clearance: 80.9 mL/min (by C-G formula based on SCr of 0.67 mg/dL).   LIVER  Recent Labs Lab 05/17/17 2315 05/20/17 1033  AST  --  22  ALT  --  19  ALKPHOS  --  53  BILITOT  --  0.7  PROT  --  5.7*  ALBUMIN  --  3.1*  INR 1.29  --      INFECTIOUS  Recent Labs Lab 05/20/17 1033 05/20/17 1107 05/21/17 0328 05/22/17 0550  LATICACIDVEN  --  1.3  --   --   PROCALCITON <0.10  --  0.24 <0.10     ENDOCRINE CBG (last 3)  No results for input(s): GLUCAP in the last 72 hours.       IMAGING x48h  - image(s) personally visualized   Dg Chest Port 1 View  Result Date: 05/22/2017 CLINICAL DATA:  Respiratory failure, shortness of breath. EXAM: PORTABLE CHEST 1 VIEW COMPARISON:  Portable chest x-ray of May 20, 2017 FINDINGS: The patient is rotated on today's  study. The lungs are well-expanded. There is no focal infiltrate. There is no pleural effusion. There is an approximately 3 mm calcified nodule in the left lower lung. The heart is mildly enlarged. The pulmonary vascularity is normal. The observed bony thorax is unremarkable. IMPRESSION: The lungs are mildly hyperinflated which likely reflects underlying COPD or reactive airway disease. Mild cardiomegaly, stable. Overall there has not been significant interval change allowing for differences in patient positioning. Electronically Signed   By: David  [place] M.D.   On: 05/22/2017 07:19   Reviewed 6/5 -  no clear infx    ASSESSMENT and PLAN  Acute respiratory failure with hypoxia- causes unclear, with fever and infiltrates being treated as hospital acquired pneumonia, but favor atelectasis/ bronchitis ? aspiration pneumonitis, note that pro calcitonin is low and leukocytosis is marginal but fever persists  - Reassess need for oxygen on discharge -ct Zosyn x 5ds total -Encourage use of incentive spirometer and out of bed  Atrial fibrillation/RVR -Per cardiology, back in nsR, Cardizem being titrated, anticoagulation on hold -Antihypertensives on hold -Hypokalemia was repleted  Anemia related to acute blood loss status post TURP -transfuse for hemoglobin 7 or lower  He will need follow-up with pulmonary office if he needs oxygen at discharge   Cyril Mourning MD. FCCP. [facility] Pager [phone] If no response call [phone]   05/23/2017

## 2017-05-24 DIAGNOSIS — J441 Chronic obstructive pulmonary disease with (acute) exacerbation: Secondary | ICD-10-CM

## 2017-05-24 LAB — CBC
HCT: 24.1 % — ABNORMAL LOW (ref 39.0–52.0)
Hemoglobin: 8.1 g/dL — ABNORMAL LOW (ref 13.0–17.0)
MCH: 31.2 pg (ref 26.0–34.0)
MCHC: 33.6 g/dL (ref 30.0–36.0)
MCV: 92.7 fL (ref 78.0–100.0)
Platelets: 167 10*3/uL (ref 150–400)
RBC: 2.6 MIL/uL — ABNORMAL LOW (ref 4.22–5.81)
RDW: 15 % (ref 11.5–15.5)
WBC: 3.6 10*3/uL — ABNORMAL LOW (ref 4.0–10.5)

## 2017-05-24 LAB — BASIC METABOLIC PANEL
Anion gap: 6 (ref 5–15)
BUN: 6 mg/dL (ref 6–20)
CO2: 31 mmol/L (ref 22–32)
Calcium: 7.8 mg/dL — ABNORMAL LOW (ref 8.9–10.3)
Chloride: 105 mmol/L (ref 101–111)
Creatinine, Ser: 0.65 mg/dL (ref 0.61–1.24)
GFR calc Af Amer: >60 mL/min (ref >60)
GFR calc non Af Amer: >60 mL/min (ref >60)
Glucose, Bld: 104 mg/dL — ABNORMAL HIGH (ref 65–99)
Potassium: 3.7 mmol/L (ref 3.5–5.1)
Sodium: 142 mmol/L (ref 135–145)

## 2017-05-24 LAB — BRAIN NATRIURETIC PEPTIDE: B Natriuretic Peptide: 88.7 pg/mL (ref 0.0–100.0)

## 2017-05-24 MED ORDER — ASPIRIN 81 MG PO CHEW
81.0000 mg | CHEWABLE_TABLET | Freq: Every day | ORAL | Status: DC
  Administered 2017-05-24 – 2017-05-25 (×2): 81 mg via ORAL
  Filled 2017-05-24 (×2): qty 1

## 2017-05-24 MED ORDER — AMIODARONE HCL 200 MG PO TABS
400.0000 mg | ORAL_TABLET | Freq: Two times a day (BID) | ORAL | Status: DC
  Administered 2017-05-24 – 2017-05-25 (×2): 400 mg via ORAL
  Filled 2017-05-24 (×2): qty 2

## 2017-05-24 MED ORDER — PREDNISONE 20 MG PO TABS
20.0000 mg | ORAL_TABLET | Freq: Every day | ORAL | Status: DC
  Administered 2017-05-24 – 2017-05-25 (×2): 20 mg via ORAL
  Filled 2017-05-24 (×2): qty 1

## 2017-05-24 MED ORDER — FUROSEMIDE 10 MG/ML IJ SOLN
40.0000 mg | Freq: Two times a day (BID) | INTRAMUSCULAR | Status: AC
  Administered 2017-05-24 – 2017-05-25 (×2): 40 mg via INTRAVENOUS
  Filled 2017-05-24 (×2): qty 4

## 2017-05-24 NOTE — Progress Notes (Signed)
Name: Adrian Ramos MRN: [id number] DOB: 03/10/1946    ADMISSION DATE:  05/17/2017 CONSULTATION DATE:  05/17/2017  REFERRING MD :  Dr. Retta D[facility]  CHEF COMPLAINT:  Hypotension post TURP   brief:  71 year old man status post elective TURP on 5/31, developed postoperative hypotension, atrial fibrillation/RVR and respiratory distress requiring BiPAP  PMH -PAF (on ASA only), and CHF (likely due to AF or ETOH abuse, EF now recovered as he has largely stopped drinking).    SIGNIFICANT EVENTS  5/31 bleeding after TURP requiring blood products and pressors. Admit to ICU 6/2 -  Off pressors. Having new dru cough. Using IS. Feeling better overall though./No active bleeding 6/2 -  febrile  at 8pm and cultured and started on broad abx .  AFib RVR  and got amio bolus at 10pm and started on po amio  And since then worsening o2 need.>> bipap 6/5 Developed a low-grade temperature of transferred to the floor and went back into A. fib RVR-acquired amio bolus overnight   SUBJECTIVE/OVERNIGHT/INTERVAL HX Walking in hall , NAD  VITAL SIGNS: Temp:  [98.3 F (36.8 C)-98.8 F (37.1 C)] 98.4 F (36.9 C) (06/07 0615) Pulse Rate:  [71-82] 71 (06/07 0615) Resp:  [18] 18 (06/07 0615) BP: (113-121)/(57-65) 119/65 (06/07 0615) SpO2:  [90 %-95 %] 95 % (06/07 0615) Weight:  [184 lb 15.5 oz (83.9 kg)] 184 lb 15.5 oz (83.9 kg) (06/07 0615)                                                        EXAM  General:  Frail wm sitting on chair after walking halls HEENT: MM pink/moist. No JVD/LAN PSY: nl affect Neuro: Intact WU:JWJX PULM: Decreased bs thru out BJ:YNWG, non-tender, bsx4 active  Extremities: warm/dry,no edema  Skin: no rashes or lesions    LABS  PULMONARY  Recent Labs Lab 05/17/17 1030  TCO2 28    CBC  Recent Labs Lab 05/22/17 0550 05/23/17 0526 05/24/17 0814  HGB 7.8* 7.2* 8.1*  HCT 23.0* 21.7* 24.1*  WBC 4.1 3.7* 3.6*  PLT 127* 128* 167     COAGULATION  Recent Labs Lab 05/17/17 2315  INR 1.29    CARDIAC    Recent Labs Lab 05/17/17 2315 05/18/17 0533 05/18/17 1413 05/20/17 1033 05/21/17 0328  TROPONINI <0.03 <0.03 <0.03 <0.03 <0.03   No results for input(s): PROBNP in the last 168 hours.   CHEMISTRY  Recent Labs Lab 05/19/17 2025 05/20/17 0343 05/21/17 0328 05/22/17 0550 05/23/17 0526 05/24/17 0814  NA 141 138  --  138 142 142  K 3.6 3.6  --  3.3* 3.9 3.7  CL 109 109  --  105 109 105  CO2 24 24  --  24 27 31   GLUCOSE 108* 120*  --  93 107* 104*  BUN 5* 6  --  7 6 6   CREATININE 0.54* 0.71 0.63 0.61 0.67 0.65  CALCIUM 8.3* 7.5*  --  7.8* 7.7* 7.8*  MG 1.5* 2.0 1.9 1.8 2.1  --   PHOS  --  2.9 3.3 3.1 3.3  --    Estimated Creatinine Clearance: 79.9 mL/min (by C-G formula based on SCr of 0.65 mg/dL).   LIVER  Recent Labs Lab 05/17/17 2315 05/20/17 1033  AST  --  22  ALT  --  19  ALKPHOS  --  53  BILITOT  --  0.7  PROT  --  5.7*  ALBUMIN  --  3.1*  INR 1.29  --      INFECTIOUS  Recent Labs Lab 05/20/17 1033 05/20/17 1107 05/21/17 0328 05/22/17 0550  LATICACIDVEN  --  1.3  --   --   PROCALCITON <0.10  --  0.24 <0.10     ENDOCRINE CBG (last 3)  No results for input(s): GLUCAP in the last 72 hours.       IMAGING x48h  - image(s) personally visualized   No results found. Reviewed 6/5 -  no clear infx    ASSESSMENT and PLAN  Acute respiratory failure with hypoxia- causes unclear, with fever and infiltrates being treated as hospital acquired pneumonia, but favor atelectasis/ bronchitis ? aspiration pneumonitis, note that pro calcitonin is low and leukocytosis is marginal but fever persists  - Reassess need for oxygen on discharge. Walked on 2 l [place] with sats 90%. I have asked PT to walk on rom air and document with a progress note. Suspect he will need O2 1 l/m at rest and 2 l/m with activity. - Zosyn completed -Encourage use of incentive spirometer and out of  bed  Atrial fibrillation/RVR -Per cardiology, back in nsR, Cardizem being titrated, anticoagulation on hold -Antihypertensives on hold -Hypokalemia was repleted  Anemia related to acute blood loss status post TURP -transfuse for hemoglobin 7 or lower  He has a follow up appointment with Dr. Sherene SiresWert of [place] [facility] 06/13/17 4:15 pm PCCM available PRN   Brett CanalesSteve Cheskel Silverio ACNP [facility] Pager [phone] till 3 pm If no answer page [phone] 05/24/2017, 10:28 AM

## 2017-05-24 NOTE — Progress Notes (Signed)
7 Days Post-Op Subjective: Patient reports feels ok. Wants to consider rehab unit  Objective: Vital signs in last 24 hours: Temp:  [98.3 F (36.8 C)-98.8 F (37.1 C)] 98.4 F (36.9 C) (06/07 0615) Pulse Rate:  [71-82] 71 (06/07 0615) Resp:  [18] 18 (06/07 0615) BP: (113-121)/(57-65) 119/65 (06/07 0615) SpO2:  [90 %-95 %] 95 % (06/07 0615) Weight:  [83.9 kg (184 lb 15.5 oz)] 83.9 kg (184 lb 15.5 oz) (06/07 0615)  Intake/Output from previous day: 06/06 0701 - 06/07 0700 In: 650 [P.O.:600; IV Piggyback:50] Out: 2025 [Urine:2025] Intake/Output this shift: No intake/output data recorded.  Physical Exam:  Constitutional: Vital signs reviewed. WD WN in NAD   Eyes: PERRL, No scleral icterus.   Cardiovascular: RRR Pulmonary/Chest: Normal effort   Lab Results:  Recent Labs  05/22/17 0550 05/23/17 0526  HGB 7.8* 7.2*  HCT 23.0* 21.7*   BMET  Recent Labs  05/22/17 0550 05/23/17 0526  NA 138 142  K 3.3* 3.9  CL 105 109  CO2 24 27  GLUCOSE 93 107*  BUN 7 6  CREATININE 0.61 0.67  CALCIUM 7.8* 7.7*   No results for input(s): LABPT, INR in the last 72 hours. No results for input(s): LABURIN in the last 72 hours. Results for orders placed or performed during the hospital encounter of 05/17/17  MRSA PCR Screening     Status: None   Collection Time: 05/17/17 11:06 PM  Result Value Ref Range Status   MRSA by PCR NEGATIVE NEGATIVE Final    Comment:        The GeneXpert MRSA Assay (FDA approved for NASAL specimens only), is one component of a comprehensive MRSA colonization surveillance program. It is not intended to diagnose MRSA infection nor to guide or monitor treatment for MRSA infections.   Culture, blood (Routine X 2) w Reflex to ID Panel     Status: None (Preliminary result)   Collection Time: 05/19/17  8:25 PM  Result Value Ref Range Status   Specimen Description BLOOD LEFT HAND  Final   Special Requests IN PEDIATRIC BOTTLE Blood Culture adequate volume   Final   Culture   Final    NO GROWTH 3 DAYS Performed at [facility], [address] [place], [place] [address]    Report Status PENDING  Incomplete  Culture, blood (Routine X 2) w Reflex to ID Panel     Status: None (Preliminary result)   Collection Time: 05/19/17  8:25 PM  Result Value Ref Range Status   Specimen Description BLOOD LEFT HAND  Final   Special Requests   Final    BOTTLES DRAWN AEROBIC ONLY Blood Culture adequate volume   Culture   Final    NO GROWTH 3 DAYS Performed at [facility], [address] [place], [place] [address]    Report Status PENDING  Incomplete    Studies/Results: No results found.  Assessment/Plan:   Posotop TURP--ready for d/c from GU standpoint but still has periods of AF--last night most recent  Wants SNF bed--I agree  I'm fine w/ d/c if ok from CV standpoint  I stopped zosyn. Restarted ASA   LOS: 6 days   Marcine MatarDAHLSTEDT, Sherolyn Trettin M 05/24/2017, 8:02 AM

## 2017-05-24 NOTE — Progress Notes (Signed)
Progress Note  Patient Name: Adrian Ramos Date of Encounter: 05/24/2017  Primary Cardiologist:   Dr. Duke Salvia  Subjective   He is breathing better but not quite at baseline.  He required 1 liter [place] with ambulation.  He did have increased urine output yesterday with IV Lasix.    Inpatient Medications    Scheduled Meds: . amiodarone  200 mg Oral q morning - 10a  . aspirin  81 mg Oral Daily  . atorvastatin  40 mg Oral QPM  . diltiazem  30 mg Oral Q6H  . famotidine  10 mg Oral QHS  . predniSONE  20 mg Oral Q breakfast  . senna  1 tablet Oral BID  . traZODone  200 mg Oral QHS   Continuous Infusions: . lactated ringers 10 mL/hr at 05/21/17 2140   PRN Meds: acetaminophen, diazepam, levalbuterol, ondansetron, opium-belladonna, oxyCODONE, phenol   Vital Signs    Vitals:   05/23/17 0612 05/23/17 1349 05/23/17 2014 05/24/17 0615  BP: (!) 115/54 (!) 113/57 121/60 119/65  Pulse: 64 77 82 71  Resp: 18 18 18 18   Temp: 99.2 F (37.3 C) 98.3 F (36.8 C) 98.8 F (37.1 C) 98.4 F (36.9 C)  TempSrc: Oral Oral Oral Oral  SpO2: 97% 90% 94% 95%  Weight: 189 lb 6.4 oz (85.9 kg)   184 lb 15.5 oz (83.9 kg)  Height:        Intake/Output Summary (Last 24 hours) at 05/24/17 1217 Last data filed at 05/24/17 1007  Gross per 24 hour  Intake              410 ml  Output             2075 ml  Net            -1665 ml   Filed Weights   05/22/17 0502 05/23/17 0612 05/24/17 0615  Weight: 187 lb 9.8 oz (85.1 kg) 189 lb 6.4 oz (85.9 kg) 184 lb 15.5 oz (83.9 kg)    Telemetry    Brief run of atrial fib - Personally Reviewed  ECG    NA - Personally Reviewed  Physical Exam   GEN: No acute distress.  Frail appearing Neck: No  JVD Cardiac: RRR, no murmurs, rubs, or gallops.  Respiratory: Clear  to auscultation bilaterally. GI: Soft, nontender, non-distended  MS: No  edema; No deformity. Neuro:  Nonfocal  Psych: Normal affect   Labs    Chemistry Recent Labs Lab 05/20/17 1033   05/22/17 0550 05/23/17 0526 05/24/17 0814  NA  --   --  138 142 142  K  --   --  3.3* 3.9 3.7  CL  --   --  105 109 105  CO2  --   --  24 27 31   GLUCOSE  --   --  93 107* 104*  BUN  --   --  7 6 6   CREATININE  --   < > 0.61 0.67 0.65  CALCIUM  --   --  7.8* 7.7* 7.8*  PROT 5.7*  --   --   --   --   ALBUMIN 3.1*  --   --   --   --   AST 22  --   --   --   --   ALT 19  --   --   --   --   ALKPHOS 53  --   --   --   --  BILITOT 0.7  --   --   --   --   GFRNONAA  --   < > >60 >60 >60  GFRAA  --   < > >60 >60 >60  ANIONGAP  --   --  9 6 6   < > = values in this interval not displayed.   Hematology Recent Labs Lab 05/22/17 0550 05/23/17 0526 05/24/17 0814  WBC 4.1 3.7* 3.6*  RBC 2.49* 2.29* 2.60*  HGB 7.8* 7.2* 8.1*  HCT 23.0* 21.7* 24.1*  MCV 92.4 94.8 92.7  MCH 31.3 31.4 31.2  MCHC 33.9 33.2 33.6  RDW 15.0 15.3 15.0  PLT 127* 128* 167    Cardiac Enzymes Recent Labs Lab 05/18/17 0533 05/18/17 1413 05/20/17 1033 05/21/17 0328  TROPONINI <0.03 <0.03 <0.03 <0.03   No results for input(s): TROPIPOC in the last 168 hours.   BNP Recent Labs Lab 05/24/17 0814  BNP 88.7     DDimer No results for input(s): DDIMER in the last 168 hours.   Radiology    No results found.  Cardiac Studies   NA this admission  Patient Profile     71 y.o. 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 Post op pt developed PAF-cardiology consulted.    Assessment & Plan    ACUTE ON CHRONIC DIASTOLIC HF:    He had mild response to IV diuresis.  Still very positive over the course of the admission.   I will give IV again today.   PAF:  He has had some paroxysms of fib.  I will increase the amiodarone for one week.   His Mr. Adrian Ramos  has a CHA2DS2 - VASc score of 3 with a risk of stroke of 3.2%.   His anticoagulation was held in the office previously secondary to melena.  I do not think that we can start this now with his anemia, recent surgery and that history.    CAD;  No ischemia on stress test in 2017.  No evidence of ischemia.   HTN:    Continue current meds.   Signed, Rollene RotundaJames Ariellah Faust, MD  05/24/2017, 12:17 PM

## 2017-05-24 NOTE — Progress Notes (Signed)
All oxygen alternative methods have been attempted and were unsuccessful. Patient currently requiring oxygen at 1 L/M [place].

## 2017-05-24 NOTE — Progress Notes (Signed)
Physical Therapy Treatment Patient Details Name: Adrian Ramos MRN: [id number] DOB: 04-16-1946 Today's Date: 05/24/2017    SATURATION QUALIFICATIONS: (This note is used to comply with regulatory documentation for home oxygen)  Patient Saturations on Room Air at Rest = 90%  Patient Saturations on Room Air while Ambulating = 87%  Patient Saturations on 1 Liters of oxygen while Ambulating = 88-89%    History of Present Illness 71 yo male s/p TURP, clot evacuation, cystoscopy 05/17/17. Post-op anemia, hypotension, acute respiratory failure.     PT Comments    Progressing with mobility. Supervision level assist for mobility tasks. Dyspnea 2/4.  Discussed d/c plan-pt thinks he may want SNF rehab. Unsure if insurance will approve SNF stay. Pt is concerned about not having and assistance in home. Encouraged pt to discuss this further with CM/SW. Would have SW consult with pt if possible.    Follow Up Recommendations  Home health PT;Supervision - Intermittent ; Home health aide     Equipment Recommendations  Rolling walker with 5" wheels    Recommendations for Other Services       Precautions / Restrictions Precautions Precautions: Fall Precaution Comments: monitor HR and O2 sats Restrictions Weight Bearing Restrictions: No    Mobility  Bed Mobility               General bed mobility comments: oob on bsc  Transfers Overall transfer level: Needs assistance   Transfers: Sit to/from Stand Sit to Stand: Supervision         General transfer comment: x2-once with RW, once without device. Cues for safe operation of rollator  Ambulation/Gait Ambulation/Gait assistance: Supervision Ambulation Distance (Feet): 150 Feet (55'x1, 150'x1) Assistive device: 4-wheeled walker Gait Pattern/deviations: Step-through pattern;Decreased stride length;Decreased step length - right;Decreased step length - left     General Gait Details: slow gait speed. O2 sats 87% on RA, 88-89% on 1L  O2 during ambulation. HR 101 bpm. Dyspnea 2/4.   Stairs            Wheelchair Mobility    Modified Rankin (Stroke Patients Only)       Balance                                            Cognition Arousal/Alertness: Awake/alert Behavior During Therapy: WFL for tasks assessed/performed Overall Cognitive Status: Within Functional Limits for tasks assessed                                        Exercises      General Comments        Pertinent Vitals/Pain Pain Assessment: No/denies pain    Home Living                      Prior Function            PT Goals (current goals can now be found in the care plan section) Progress towards PT goals: Progressing toward goals    Frequency    Min 3X/week      PT Plan Current plan remains appropriate    Co-evaluation              AM-PAC PT "6 Clicks" Daily Activity  Outcome Measure  Difficulty turning over in bed (including adjusting  bedclothes, sheets and blankets)?: A Little Difficulty moving from lying on back to sitting on the side of the bed? : A Little Difficulty sitting down on and standing up from a chair with arms (e.g., wheelchair, bedside commode, etc,.)?: A Little Help needed moving to and from a bed to chair (including a wheelchair)?: A Little Help needed walking in hospital room?: A Little Help needed climbing 3-5 steps with a railing? : A Little 6 Click Score: 18    End of Session Equipment Utilized During Treatment: Oxygen Activity Tolerance: Patient tolerated treatment well Patient left: in chair;with call bell/phone within reach   PT Visit Diagnosis: Muscle weakness (generalized) (M62.81);Difficulty in walking, not elsewhere classified (R26.2)     Time: [id number] PT Time Calculation (min) (ACUTE ONLY): 33 min  Charges:  $Gait Training: 23-37 mins                    G Codes:          Rebeca AlertJannie Markeeta Scalf, MPT Pager: [phone]

## 2017-05-24 NOTE — Care Management Note (Signed)
Case Management Note  Patient Details  Name: Drucilla ChaletMark I Menning MRN: [id number] Date of Birth: 06/04/1946  Subjective/Objective: PT-recc HHPT. [facility] rep Selena BattenKim already following for HHRN/PT/OT/aide,social worker, 4 wheeled rw,home 02-to deliver to rm prior d/c. Likely d/c in am.                   Action/Plan:d/c home w/HHC/dme.   Expected Discharge Date:                  Expected Discharge Plan:  Home w Home Health Services  In-House Referral:     Discharge planning Services  CM Consult  Post Acute Care Choice:    Choice offered to:  Patient  DME Arranged:  Walker rolling with seat, Oxygen DME Agency:  [facility]  HH Arranged:  RN, PT, OT, Nurse's Aide, Social Work [employer] [facility]  [facility]  Status of Service:  In process, will continue to follow  If discussed at Long Length of Stay Meetings, dates discussed:    Additional Comments:  Lanier ClamMahabir, Filicia Scogin, RN 05/24/2017, 2:18 PM

## 2017-05-25 ENCOUNTER — Other Ambulatory Visit: Payer: Self-pay | Admitting: Physician Assistant

## 2017-05-25 DIAGNOSIS — R55 Syncope and collapse: Secondary | ICD-10-CM | POA: Insufficient documentation

## 2017-05-25 LAB — BASIC METABOLIC PANEL
Anion gap: 7 (ref 5–15)
BUN: 7 mg/dL (ref 6–20)
CO2: 28 mmol/L (ref 22–32)
Calcium: 8.1 mg/dL — ABNORMAL LOW (ref 8.9–10.3)
Chloride: 103 mmol/L (ref 101–111)
Creatinine, Ser: 0.59 mg/dL — ABNORMAL LOW (ref 0.61–1.24)
GFR calc Af Amer: >60 mL/min (ref >60)
GFR calc non Af Amer: >60 mL/min (ref >60)
Glucose, Bld: 115 mg/dL — ABNORMAL HIGH (ref 65–99)
Potassium: 3.8 mmol/L (ref 3.5–5.1)
Sodium: 138 mmol/L (ref 135–145)

## 2017-05-25 LAB — CBC
HCT: 23.7 % — ABNORMAL LOW (ref 39.0–52.0)
Hemoglobin: 7.9 g/dL — ABNORMAL LOW (ref 13.0–17.0)
MCH: 31.3 pg (ref 26.0–34.0)
MCHC: 33.3 g/dL (ref 30.0–36.0)
MCV: 94 fL (ref 78.0–100.0)
Platelets: 201 10*3/uL (ref 150–400)
RBC: 2.52 MIL/uL — ABNORMAL LOW (ref 4.22–5.81)
RDW: 15 % (ref 11.5–15.5)
WBC: 4.3 10*3/uL (ref 4.0–10.5)

## 2017-05-25 LAB — CULTURE, BLOOD (ROUTINE X 2)
Culture: NO GROWTH
Culture: NO GROWTH
Special Requests: ADEQUATE
Special Requests: ADEQUATE

## 2017-05-25 MED ORDER — DILTIAZEM HCL ER COATED BEADS 120 MG PO CP24
120.0000 mg | ORAL_CAPSULE | Freq: Every day | ORAL | Status: DC
  Administered 2017-05-25: 120 mg via ORAL
  Filled 2017-05-25: qty 1

## 2017-05-25 MED ORDER — FUROSEMIDE 40 MG PO TABS: 40.0000 mg | ORAL_TABLET | Freq: Every day | ORAL | Status: DC

## 2017-05-25 MED ORDER — FUROSEMIDE 40 MG PO TABS: ORAL_TABLET | ORAL | 3 refills | Status: DC

## 2017-05-25 MED ORDER — POTASSIUM CHLORIDE CRYS ER 20 MEQ PO TBCR: EXTENDED_RELEASE_TABLET | ORAL | 3 refills | Status: AC

## 2017-05-25 MED ORDER — DILTIAZEM HCL ER COATED BEADS 120 MG PO CP24: 120.0000 mg | ORAL_CAPSULE | Freq: Every day | ORAL | 3 refills | Status: DC

## 2017-05-25 MED ORDER — POTASSIUM CHLORIDE CRYS ER 20 MEQ PO TBCR
20.0000 meq | EXTENDED_RELEASE_TABLET | Freq: Every day | ORAL | Status: DC
  Administered 2017-05-25: 20 meq via ORAL
  Filled 2017-05-25: qty 1

## 2017-05-25 MED ORDER — AMIODARONE HCL 200 MG PO TABS: ORAL_TABLET | ORAL | 1 refills | Status: DC

## 2017-05-25 NOTE — Discharge Summary (Addendum)
Patient ID: Adrian Ramos MRN: [id number] DOB/AGE: 04-05-1946 71 y.o.  Admit date: 05/17/2017 Discharge date: 05/25/2017  Primary Care Physician:  Martha Clan, MD  Discharge Diagnoses:   1.  Anemia secondary to acute blood loss. 2.  COPD with exacerbation. 3.  Aspiration pneumonia. 4.  BPH 5.  Atrial fibrillation, treated  Consults:  Cardiology, pulmonary   Discharge Medications: Allergies as of 05/25/2017      Reactions   Iodinated Diagnostic Agents Hives, Other (See Comments)   Pt needs 13 hour pre med prior to scan.    Xarelto [rivaroxaban] Diarrhea      Medication List    STOP taking these medications   aspirin EC 81 MG tablet     TAKE these medications   acidophilus Caps capsule Take 1 capsule by mouth daily.   amiodarone 200 MG tablet Commonly known as:  PACERONE Take 100 mg by mouth daily.   atorvastatin 40 MG tablet Commonly known as:  LIPITOR Take 1 tablet (40 mg total) by mouth daily at 6 PM.   chlorhexidine 0.12 % solution Commonly known as:  PERIDEX Use as directed 10 mLs in the mouth or throat daily.   cholecalciferol 1000 units tablet Commonly known as:  VITAMIN D Take 1,000 Units by mouth every evening.   Clotrimazole 1 % Oint Apply 1 application topically 2 (two) times daily as needed (for itching/irritation).   CoQ10 100 MG Caps Take 100 mg by mouth daily.   diazepam 10 MG tablet Commonly known as:  VALIUM Take 10 mg by mouth 4 (four) times daily as needed for anxiety.   docusate sodium 100 MG capsule Commonly known as:  COLACE Take 100 mg by mouth daily.   finasteride 5 MG tablet Commonly known as:  PROSCAR Take 5 mg by mouth every evening.   furosemide 40 MG tablet Commonly known as:  LASIX Take 40 mg by mouth daily.   losartan 25 MG tablet Commonly known as:  COZAAR Take 1 tablet (25 mg total) by mouth daily.   milk thistle 175 MG tablet Take 175 mg by mouth daily.   omega-3 acid ethyl esters 1 g capsule Commonly  known as:  LOVAZA Take 1 g by mouth daily.   PROSTATE HEALTH Caps Take 1 capsule by mouth daily.   ranitidine 150 MG tablet Commonly known as:  ZANTAC TAKE 1 TABLET (150 MG TOTAL) BY MOUTH 2 (TWO) TIMES DAILY.   traZODone 100 MG tablet Commonly known as:  DESYREL Take 200 mg by mouth at bedtime.   Vitamin B-12 5000 MCG Subl Place 5,000 mcg under the tongue daily.   vitamin C 1000 MG tablet Take 1,000 mg by mouth daily.   vitamin C 1000 MG tablet Take 1,000 mg by mouth daily.   vitamin E 400 UNIT capsule Take 400 Units by mouth daily.            Durable Medical Equipment        Start     Ordered   05/25/17 1259  For home use only DME oxygen  Once    Question Answer Comment  Mode or (Route) Nasal cannula   Liters per Minute 2   Frequency Continuous (stationary and portable oxygen unit needed)   Oxygen conserving device No   Oxygen delivery system Gas      05/24/17 1259   05/23/17 1434  For home use only DME 4 wheeled rolling walker with seat  Once    Question:  Patient needs a  walker to treat with the following condition  Answer:  Unsteady gait   05/23/17 1433       Significant Diagnostic Studies:  Dg Chest Port 1 View  Result Date: 05/18/2017 CLINICAL DATA:  Dyspnea. EXAM: PORTABLE CHEST 1 VIEW COMPARISON:  Scout image for CT scan of the chest dated 10/16/2016 FINDINGS: Heart size and pulmonary vascularity are normal and the lungs are clear except for a tiny stable calcified granuloma at the left lung base laterally. No appreciable effusions. No significant bone abnormality. IMPRESSION: No active disease. Electronically Signed   By: Francene BoyersJames  Maxwell M.D.   On: 05/18/2017 12:57    Brief H and P: For complete details please refer to admission H and P, but in brief , The patient was admitted for a transurethral resection of his prostate for BPH with obstruction, as he was catheter dependent.  Hospital Course:  Principal Problem:   Acute respiratory failure with  hypoxia (HCC) Active Problems:   Atrial fibrillation with RVR (HCC)   Enlarged prostate with urinary obstruction   BPH (benign prostatic hyperplasia)   HAP (hospital-acquired pneumonia)   Shock circulatory (HCC)   Acute post-hemorrhagic anemia   Severe sepsis (HCC)  The patient underwent TURP on 05/17/2017.  Unfortunately, he experienced a postoperative bleed and was taken back to the operating room the same day for clot evacuation.  No significant bleeding was seen, however.   Following this, from a urologic standpoint, the urine cleared and the patient was put on a voiding trial which was successful.  However, patient did experience postoperative anemia requiring transfusion.  Additionally, he had exacerbation of his CHF and probable pneumonia , felt secondary to aspiration.  These were managed by Dr. Antoine PocheHochrein and Vassie LollAlva , respectively.  The patient had gradual improvement of his CHF and received 6 days of Zosyn.  His clinical condition improved, and by 05/25/2017 was felt adequate for discharge to home with home health care provisions.    Day of Discharge BP 122/78 (BP Location: Left Arm)   Pulse 62   Temp 97.7 F (36.5 C) (Oral)   Resp 16   Ht 5\' 3"  (1.6 m)   Wt 84.1 kg (185 lb 6.5 oz)   SpO2 95%   BMI 32.84 kg/m   Results for orders placed or performed during the hospital encounter of 05/17/17 (from the past 24 hour(s))  Basic metabolic panel     Status: Abnormal   Collection Time: 05/24/17  8:14 AM  Result Value Ref Range   Sodium 142 135 - 145 mmol/L   Potassium 3.7 3.5 - 5.1 mmol/L   Chloride 105 101 - 111 mmol/L   CO2 31 22 - 32 mmol/L   Glucose, Bld 104 (H) 65 - 99 mg/dL   BUN 6 6 - 20 mg/dL   Creatinine, Ser 1.610.65 0.61 - 1.24 mg/dL   Calcium 7.8 (L) 8.9 - 10.3 mg/dL   GFR calc non Af Amer >60 >60 mL/min   GFR calc Af Amer >60 >60 mL/min   Anion gap 6 5 - 15  CBC     Status: Abnormal   Collection Time: 05/24/17  8:14 AM  Result Value Ref Range   WBC 3.6 (L) 4.0 - 10.5  K/uL   RBC 2.60 (L) 4.22 - 5.81 MIL/uL   Hemoglobin 8.1 (L) 13.0 - 17.0 g/dL   HCT 09.624.1 (L) 04.539.0 - 40.952.0 %   MCV 92.7 78.0 - 100.0 fL   MCH 31.2 26.0 - 34.0 pg  MCHC 33.6 30.0 - 36.0 g/dL   RDW 09.8 11.9 - 14.7 %   Platelets 167 150 - 400 K/uL  Brain natriuretic peptide     Status: None   Collection Time: 05/24/17  8:14 AM  Result Value Ref Range   B Natriuretic Peptide 88.7 0.0 - 100.0 pg/mL  Basic metabolic panel     Status: Abnormal   Collection Time: 05/25/17  4:36 AM  Result Value Ref Range   Sodium 138 135 - 145 mmol/L   Potassium 3.8 3.5 - 5.1 mmol/L   Chloride 103 101 - 111 mmol/L   CO2 28 22 - 32 mmol/L   Glucose, Bld 115 (H) 65 - 99 mg/dL   BUN 7 6 - 20 mg/dL   Creatinine, Ser 8.29 (L) 0.61 - 1.24 mg/dL   Calcium 8.1 (L) 8.9 - 10.3 mg/dL   GFR calc non Af Amer >60 >60 mL/min   GFR calc Af Amer >60 >60 mL/min   Anion gap 7 5 - 15  CBC     Status: Abnormal   Collection Time: 05/25/17  4:36 AM  Result Value Ref Range   WBC 4.3 4.0 - 10.5 K/uL   RBC 2.52 (L) 4.22 - 5.81 MIL/uL   Hemoglobin 7.9 (L) 13.0 - 17.0 g/dL   HCT 56.2 (L) 13.0 - 86.5 %   MCV 94.0 78.0 - 100.0 fL   MCH 31.3 26.0 - 34.0 pg   MCHC 33.3 30.0 - 36.0 g/dL   RDW 78.4 69.6 - 29.5 %   Platelets 201 150 - 400 K/uL    Physical Exam: General: Alert and awake oriented x3 not in any acute distress. HEENT: anicteric sclera, pupils reactive to light and accommodation CVS: S1-S2 clear no murmur rubs or gallops Chest: clear to auscultation bilaterally, no wheezing rales or rhonchi Abdomen: soft nontender, nondistended, normal bowel sounds, no organomegaly Extremities: no cyanosis, clubbing or edema noted bilaterally Neuro: Cranial nerves II-XII intact, no focal neurological deficits  Disposition:  Home  Diet:  No restrictions  Activity:  Gradually increase, physical therapy, provided   Disposition and Follow-up:    He will follow-up in the office as scheduled  TESTS THAT NEED FOLLOW-UP   None  DISCHARGE FOLLOW-UP Follow-up Information    Nyoka Cowden, MD Follow up on 06/13/2017.   Specialty:  Pulmonary Disease Why:  415pm Contact information: [address] [place] [address] [phone]        [facility]-Home Follow up.   Why:  HH nursing, physical/occupational Social worker information: [address] [place] [address] [phone]        [facility] - Dme Follow up.   Why:  4 wheeled rolling walker,oxygen Contact information: [address] [place] [address] [phone]           Time spent on Discharge:  20 minutes  Signed: Chelsea Aus 05/25/2017, 7:14 AM

## 2017-05-25 NOTE — Progress Notes (Signed)
Progress Note  Patient Name: Adrian Ramos Date of Encounter: 05/25/2017  Primary Cardiologist:   Dr. Duke Salvia  Subjective   He is breathing OK but not at baseline.    Inpatient Medications    Scheduled Meds: . amiodarone  400 mg Oral BID  . aspirin  81 mg Oral Daily  . atorvastatin  40 mg Oral QPM  . diltiazem  30 mg Oral Q6H  . famotidine  10 mg Oral QHS  . predniSONE  20 mg Oral Q breakfast  . senna  1 tablet Oral BID  . traZODone  200 mg Oral QHS   Continuous Infusions: . lactated ringers 10 mL/hr at 05/21/17 2140   PRN Meds: acetaminophen, diazepam, levalbuterol, ondansetron, opium-belladonna, oxyCODONE, phenol   Vital Signs    Vitals:   05/24/17 0615 05/24/17 1252 05/24/17 2040 05/25/17 0453  BP: 119/65 (!) 117/57 124/70 122/78  Pulse: 71 78 65 62  Resp: 18 18 18 16   Temp: 98.4 F (36.9 C) 98.3 F (36.8 C) 98.4 F (36.9 C) 97.7 F (36.5 C)  TempSrc: Oral Oral Oral Oral  SpO2: 95% 94% 95% 95%  Weight: 184 lb 15.5 oz (83.9 kg)   185 lb 6.5 oz (84.1 kg)  Height:        Intake/Output Summary (Last 24 hours) at 05/25/17 1049 Last data filed at 05/25/17 0944  Gross per 24 hour  Intake              720 ml  Output             1685 ml  Net             -965 ml   Filed Weights   05/23/17 0612 05/24/17 0615 05/25/17 0453  Weight: 189 lb 6.4 oz (85.9 kg) 184 lb 15.5 oz (83.9 kg) 185 lb 6.5 oz (84.1 kg)    Telemetry    Brief run of atrial fib - Personally Reviewed  ECG    NA - Personally Reviewed  Physical Exam   GEN: No  acute distress.   Neck: No  JVD Cardiac:  RRR, no murmurs, rubs, or gallops.  Respiratory:  Decreased breath sounds with coarse crackles GI: Soft, nontender, non-distended, normal bowel sounds  MS:  No edema; No deformity. Neuro:   Nonfocal  Psych: Oriented and appropriate    Labs    Chemistry Recent Labs Lab 05/20/17 1033  05/23/17 0526 05/24/17 0814 05/25/17 0436  NA  --   < > 142 142 138  K  --   < > 3.9 3.7 3.8    CL  --   < > 109 105 103  CO2  --   < > 27 31 28   GLUCOSE  --   < > 107* 104* 115*  BUN  --   < > 6 6 7   CREATININE  --   < > 0.67 0.65 0.59*  CALCIUM  --   < > 7.7* 7.8* 8.1*  PROT 5.7*  --   --   --   --   ALBUMIN 3.1*  --   --   --   --   AST 22  --   --   --   --   ALT 19  --   --   --   --   ALKPHOS 53  --   --   --   --   BILITOT 0.7  --   --   --   --  GFRNONAA  --   < > >60 >60 >60  GFRAA  --   < > >60 >60 >60  ANIONGAP  --   < > 6 6 7   < > = values in this interval not displayed.   Hematology  Recent Labs Lab 05/23/17 0526 05/24/17 0814 05/25/17 0436  WBC 3.7* 3.6* 4.3  RBC 2.29* 2.60* 2.52*  HGB 7.2* 8.1* 7.9*  HCT 21.7* 24.1* 23.7*  MCV 94.8 92.7 94.0  MCH 31.4 31.2 31.3  MCHC 33.2 33.6 33.3  RDW 15.3 15.0 15.0  PLT 128* 167 201    Cardiac Enzymes  Recent Labs Lab 05/18/17 1413 05/20/17 1033 05/21/17 0328  TROPONINI <0.03 <0.03 <0.03   No results for input(s): TROPIPOC in the last 168 hours.   BNP  Recent Labs Lab 05/24/17 0814  BNP 88.7     DDimer No results for input(s): DDIMER in the last 168 hours.   Radiology    No results found.  Cardiac Studies   NA this admission  Patient Profile     71 y.o. 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 Post op pt developed PAF-cardiology consulted.    Assessment & Plan    ACUTE ON CHRONIC DIASTOLIC HF:    I am not sure the I/Os have been complete.  He has had decreased weights over the last couple of days.  OK to discharge on PO diuretic 40 meq daily with prn 40 meq weight gain or edema.  We will arrange follow up in our office.  Please send home with PRN Kdur 20 meq to be taken once daily if his weight necessitates increased Lasix on any given day.     PAF:  He has had some paroxysms of fib.  I increased the amiodarone for one week only then back to 200 mg daily.  Mr. Drucilla ChaletMark I Wedin has a CHA2DS2 - VASc score of 3 with a risk of stroke of 3.2%.   His anticoagulation was held in the office previously secondary to melena.  I do not think that we can start this now with his anemia, recent surgery and that history.    CAD;  No ischemia on stress test in 2017.  No evidence of ischemia.  No further ischemia work up at this point.   HTN:   This is being managed in the context of treating his CHF   Signed, Rollene RotundaJames Lesleyanne Politte, MD  05/25/2017, 10:49 AM

## 2017-05-25 NOTE — Progress Notes (Signed)
Patient given discharge instructions. Patient has received walker and [facility] oxygen [facility]. No questions or concerns at this time.

## 2017-05-25 NOTE — Care Management Note (Signed)
Case Management Note  Patient Details  Name: Adrian Ramos MRN: [id number] Date of Birth: 03/21/1946  Subjective/Objective:  [facility] aware of dme needs to deliver home 02 to rm prior d[facility]c. [facility].Insurance underwriterrivate sitter list given. No further CM needs.                  Action/Plan:d/c home w/HHC/dme.   Expected Discharge Date:  05/25/17               Expected Discharge Plan:  Home w Home Health Services  In-House Referral:     Discharge planning Services  CM Consult  Post Acute Care Choice:    Choice offered to:  Patient  DME Arranged:  Walker rolling with seat, Oxygen DME Agency:  [facility]  HH Arranged:  RN, PT, OT, Nurse's Aide, Social Work [facility]  [facility]  Status of Service:  Completed, signed off  If discussed at [employer] Meetings, dates discussed:    Additional Comments:  Lanier ClamMahabir, Colbi Schiltz, RN 05/25/2017, 9:21 AM

## 2017-06-04 ENCOUNTER — Telehealth: Payer: Self-pay | Admitting: Cardiovascular Disease

## 2017-06-04 NOTE — Telephone Encounter (Signed)
Wrong number listed. This number is Annice PihJackie from [facility].

## 2017-06-04 NOTE — Telephone Encounter (Signed)
New message  Beth from [facility] called requesting to speak with Rn. Beth states pt was experiencing some orthostatic hypertension bp 80/20. Beth states when she checked it was 102/50... Standing it was 90/50. Pt not dizzy. Heart rate 46 nothing above 30. At time irregular heart beat.. Please call back to discuss

## 2017-06-05 NOTE — Telephone Encounter (Signed)
Spoke with the Adrian Ramos from [facility]. She stated that the patient has been experiencing orthostatic hypotension. His vitals yesterday were 102/50 sitting and then standing was 90/50 with heart rates 52 and 44. The patient has an appointment with Corine ShelterLuke Kilroy, PA tomorrow at 9:30 am.

## 2017-06-05 NOTE — Telephone Encounter (Signed)
Called the patient back and he stated that he felt "ok" and gave me the correct number for Beth from [facility]. He stated that he has an appointmentt with Corine ShelterLuke Kilroy, PA tomorrow.  Message has been left with Waynetta SandyBeth, nurse, to call back.

## 2017-06-05 NOTE — Telephone Encounter (Signed)
Called the patient back. He stated that he felt good today and has not been dizzy. He stated that the dizziness when standing was mild and did not occur a lot. He has been instructed to rise slowly from a lying or sitting position and to be sure to bring his blood pressure log to his appointment tomorrow. He verbalized his understanding.

## 2017-06-06 ENCOUNTER — Ambulatory Visit (INDEPENDENT_AMBULATORY_CARE_PROVIDER_SITE_OTHER): Payer: Medicare Other | Admitting: Cardiology

## 2017-06-06 ENCOUNTER — Encounter: Payer: Self-pay | Admitting: Cardiology

## 2017-06-06 VITALS — BP 87/50 | HR 62 | Ht 63.5 in | Wt 177.0 lb

## 2017-06-06 DIAGNOSIS — I2510 Atherosclerotic heart disease of native coronary artery without angina pectoris: Secondary | ICD-10-CM | POA: Diagnosis not present

## 2017-06-06 DIAGNOSIS — I480 Paroxysmal atrial fibrillation: Secondary | ICD-10-CM | POA: Diagnosis not present

## 2017-06-06 DIAGNOSIS — N401 Enlarged prostate with lower urinary tract symptoms: Secondary | ICD-10-CM

## 2017-06-06 DIAGNOSIS — Y95 Nosocomial condition: Secondary | ICD-10-CM

## 2017-06-06 DIAGNOSIS — J189 Pneumonia, unspecified organism: Secondary | ICD-10-CM | POA: Diagnosis not present

## 2017-06-06 DIAGNOSIS — N138 Other obstructive and reflux uropathy: Secondary | ICD-10-CM

## 2017-06-06 MED ORDER — FUROSEMIDE 40 MG PO TABS: 40.0000 mg | ORAL_TABLET | Freq: Every day | ORAL | 3 refills | Status: AC | PRN

## 2017-06-06 MED ORDER — LOSARTAN POTASSIUM 25 MG PO TABS: 25.0000 mg | ORAL_TABLET | Freq: Every day | ORAL | 11 refills | Status: DC

## 2017-06-06 NOTE — Assessment & Plan Note (Signed)
S/P TURP 05/17/17

## 2017-06-06 NOTE — Assessment & Plan Note (Signed)
Episode of PAF s/p TURP- Amio increased for 1 week CHA2DS2 VASc=3, not anticoagulated secondary to a history of melena and recent surgery

## 2017-06-06 NOTE — Assessment & Plan Note (Signed)
Pt is currently on QHS O2

## 2017-06-06 NOTE — Assessment & Plan Note (Signed)
Non obstructive by CTA Oct 2017-50-75% LAD, Myoview low risk then

## 2017-06-06 NOTE — Progress Notes (Signed)
06/06/2017 [name]   1946-01-30  [id number]  Primary Physician Martha Clan, MD Primary Cardiologist: Dr Duke Salvia  HPI:  71 y/o male with a history of PAF. He has not been on anticoagulation secondary to a history of melena. He was admitted 05/16/17-05/25/17 for a TURP. Post op he had complications with AF with RVR, hypotension, and HCAP. He eventually converted and was discharged, on QHS O2, daily Lasix, increased Amiodarone for one week, and Diltiazem (new for him). He is in the office today for follow up. He has been doing well. His LE edema has resolved but he had continued the daily Lasix. His B/P has been running low at home but fortunately he has not been symptomatic. He thinks he was in AF last PM but his EKG today shows NSR. He is not on a ASA secondary to continued post op bleeding.    Current Outpatient Prescriptions  Medication Sig Dispense Refill  . acidophilus (RISAQUAD) CAPS capsule Take 1 capsule by mouth daily.    Marland Kitchen amiodarone (PACERONE) 200 MG tablet Take 2 tablets (400 mg) twice daily for 7 days. Then decrease to home dose of 100 mg daily. 58 tablet 1  . Ascorbic Acid (VITAMIN C) 1000 MG tablet Take 1,000 mg by mouth daily.    Marland Kitchen atorvastatin (LIPITOR) 40 MG tablet Take 1 tablet (40 mg total) by mouth daily at 6 PM. 30 tablet 11  . beta carotene 40981 UNIT capsule Take 10,000 Units by mouth daily.    . chlorhexidine (PERIDEX) 0.12 % solution Use as directed 10 mLs in the mouth or throat daily.   3  . Cholecalciferol (VITAMIN D3) 5000 units CAPS Take 5,000 Units by mouth daily.    . Clotrimazole 1 % OINT Apply 1 application topically 2 (two) times daily as needed (for itching/irritation).    . Coenzyme Q10 (COQ10) 100 MG CAPS Take 100 mg by mouth daily.    . diazepam (VALIUM) 10 MG tablet Take 10 mg by mouth 4 (four) times daily as needed for anxiety.     . docusate sodium (COLACE) 100 MG capsule Take 100 mg by mouth daily.    . furosemide (LASIX) 40 MG tablet Take 1  tablet (40 mg total) by mouth daily as needed for edema. 60 tablet 3  . losartan (COZAAR) 25 MG tablet Take 1 tablet (25 mg total) by mouth at bedtime. 30 tablet 11  . milk thistle 175 MG tablet Take 175 mg by mouth daily.    . Misc Natural Products (PROSTATE HEALTH) CAPS Take 1 capsule by mouth daily.     . Omega-3 Fatty Acids (OMEGA-3 PO) Take 950 mg by mouth daily.    . potassium chloride SA (K-DUR,KLOR-CON) 20 MEQ tablet Take 1 tablet (20 mEq) daily PRN if an extra lasix has been taken that day. 30 tablet 3  . ranitidine (ZANTAC) 150 MG tablet TAKE 1 TABLET (150 MG TOTAL) BY MOUTH 2 (TWO) TIMES DAILY. 60 tablet 3  . traZODone (DESYREL) 100 MG tablet Take 200 mg by mouth at bedtime.     . vitamin B-12 (CYANOCOBALAMIN) 500 MCG tablet Take 500 mcg by mouth daily.    . vitamin E 400 UNIT capsule Take 400 Units by mouth daily.      No current facility-administered medications for this visit.     Allergies  Allergen Reactions  . Iodinated Diagnostic Agents Hives and Other (See Comments)    Pt needs 13 hour pre med prior to scan.   Marland Kitchen  Xarelto [Rivaroxaban] Diarrhea    Past Medical History:  Diagnosis Date  . Alcohol abuse    as of 05-08-2017 per pt in remission since 10/ 2017 down to 2 drinks per month (which is not remission)  . Anxiety   . BPH (benign prostatic hyperplasia)   . Cardiomyopathy (HCC)    currrently ef 55-60% per echo 03-05-2017 up from previous echo 10-11-2016 35-40%  . Chronic back pain   . Complication of anesthesia 2007   POST-OP URINARY RETENTION  . Coronary artery disease CARDIOLOG[facility]  [facility]   cardiac/coronary CT 10-16-2016-- score 97 ,  moderate CAD in LAD and mild CAD in RCA  . Foley catheter in place   . GERD (gastroesophageal reflux disease)   . History of urinary retention 2007   POST OP  (BPH W/ BLADDER OUTLET OBSTRUCTION)  . Hypertension   . IDA (iron deficiency anemia)   . Insomnia   . PAF (paroxysmal atrial fibrillation) (HCC)  CARDIOLOGIST-  DR TIFFANY Beaumont   EPISODE 10/ 2017 FAILED CARDIOVERSION 10-12-2016 BUT CONVERTED TO NSR W/ AMIODORONE  . Pulmonary nodule, left 10/16/2016  . RBBB (right bundle branch block)   . Recurrent right inguinal hernia   . Systolic and diastolic CHF, chronic [facility](HCC)    cardiologist-  dr Duke Salviarandolph ([place]) per 03-05-2017 , ef 55-60%  . Urinary retention     Social History   Social History  . Marital status: Single    Spouse name: N/A  . Number of children: 0  . Years of education: N/A   Occupational History  . retired    Social History Main Topics  . Smoking status: Former Smoker    Years: 25.00    Types: Cigars    Quit date: 12/07/2015  . Smokeless tobacco: Former NeurosurgeonUser    Types: Chew    Quit date: 12/18/1989     Comment: 2 CIGARS DAILY  . Alcohol use Yes     Comment: hx alcohol abuse  --- per pt 10/ 2017 down to 2 per month average   . Drug use: No     Comment: 05-08-2017 per pt  "just for 1 yr cocaine use in 1978"  . Sexual activity: Not on file   Other Topics Concern  . Not on file   Social History Narrative  . No narrative on file     Family History  Problem Relation Age of Onset  . Cerebrovascular Accident Mother   . Hodgkin's lymphoma Mother   . Depression Father   . Heart disease Father   . Hypertension Father   . Anxiety disorder Father   . Colon cancer Neg Hx      Review of Systems: General: negative for chills, fever, night sweats or weight changes.  Cardiovascular: negative for chest pain, dyspnea on exertion, edema, orthopnea, palpitations, paroxysmal nocturnal dyspnea or shortness of breath Dermatological: negative for rash Respiratory: negative for cough or wheezing Urologic: negative for hematuria Abdominal: negative for nausea, vomiting, diarrhea, bright red blood per rectum, melena, or hematemesis Neurologic: negative for visual changes, syncope, or dizziness All other systems reviewed and are otherwise negative except as noted  above.    Blood pressure (!) 87/50, pulse 62, height 5\' 3"  (1.6 m), weight 177 lb (80.3 kg), SpO2 99 %.  General appearance: alert, cooperative, no distress and poor dentition Neck: no carotid bruit and no JVD Lungs: clear to auscultation bilaterally Heart: regular rate and rhythm Extremities: no edema Neurologic: Grossly normal  EKG NSR, SB-54,  RBBB  ASSESSMENT AND PLAN:   PAF (paroxysmal atrial fibrillation) (HCC) Episode of PAF s/p TURP- Amio increased for 1 week CHA2DS2 VASc=3, not anticoagulated secondary to a history of melena and recent surgery  CAD (coronary artery disease) Non obstructive by CTA Oct 2017-50-75% LAD, Myoview low risk then  Enlarged prostate with urinary obstruction S/P TURP 05/17/17  HAP (hospital-acquired pneumonia) Pt is currently on QHS O2   PLAN  Stop Diltiazem, change Lasix to PRN for LE edema, change Cozaar to QHS. He is back on his home dose Amiodarone of 100 mg daily. F/U with Dr Duke Salvia in 3 months. Resume ASA as soon as Dr Retta Diones feels its safe.   Corine Shelter PA-C 06/06/2017 10:24 AM

## 2017-06-06 NOTE — Patient Instructions (Signed)
Adrian ShelterLuke Kilroy, [place]-C, has recommended making the following medication changes: 1. STOP Diltiazem 2. TAKE Furosemide as needed for swelling 3. TAKE Losartan at bedtime  Your physician recommends that you schedule a follow-up appointment in 3 months with Dr Duke Salviaandolph.  If you need a refill on your cardiac medications before your next appointment, please call your pharmacy.

## 2017-06-07 ENCOUNTER — Telehealth: Payer: Self-pay | Admitting: Cardiovascular Disease

## 2017-06-07 NOTE — Telephone Encounter (Signed)
Pt is having Urinary Tract Infection symptoms. She wants to know if it is alright for him to take Azo,that is what his doctor recommends. She just did not want the Azo  to interfere with his other medicine.Pt was taken off his Aspirin in the hospital,should he stay off of it?

## 2017-06-07 NOTE — Telephone Encounter (Signed)
Per [facility] note(06-06-17):Resume ASA as soon as Dr Retta Dionesahlstedt feels its safe.   S/w Baxter HireKristen RPH she states that pt can take the regular AZO but, nothing with cranberry for someone that takes furosemide.  Notified [facility] notified of above directions. She states that URO directed pt to take AZO. She will have him call back to see about the ASA.

## 2017-06-08 NOTE — Addendum Note (Signed)
Addended by: Neta EhlersRUITT, Qamar Aughenbaugh M on: 06/08/2017 08:36 AM   Modules accepted: Orders

## 2017-06-13 ENCOUNTER — Inpatient Hospital Stay: Payer: Medicare Other | Admitting: Internal Medicine

## 2017-06-18 ENCOUNTER — Telehealth: Payer: Self-pay | Admitting: Cardiovascular Disease

## 2017-06-18 ENCOUNTER — Encounter: Payer: Self-pay | Admitting: Cardiovascular Disease

## 2017-06-18 NOTE — Telephone Encounter (Signed)
New Message  Pt call requesting to speak with RN. Pt states he stop taking aspirin prior to his surgical procedure. Pt states it has been 4 days after he has not has any blood in urine. Pt would like to know if he would need to go back on the aspirin. Please call back to discuss

## 2017-06-18 NOTE — Telephone Encounter (Signed)
Spoke with pt, made aware if okay with urology he can restart the aspirin. Patient voiced understanding

## 2017-06-21 ENCOUNTER — Telehealth: Payer: Self-pay | Admitting: Cardiovascular Disease

## 2017-06-21 ENCOUNTER — Telehealth: Payer: Self-pay | Admitting: Pulmonary Disease

## 2017-06-21 NOTE — Telephone Encounter (Signed)
New message      Pt c/o BP issue: STAT if pt c/o blurred vision, one-sided weakness or slurred speech  1. What are your last 5 BP readings?  84/48 today with a heart rate of 64-84 (pt in afib) 2. Are you having any other symptoms (ex. Dizziness, headache, blurred vision, passed out)? Swelling in feet 3. What is your BP issue? Calling to report low bp.  Nurse thinks it is because pt has taken extra lasix since Monday because of swelling in feet

## 2017-06-21 NOTE — Telephone Encounter (Signed)
S/w [facility] she states that pt's BP runs in the 90s but she has to call and let us know "when it is running low" denies any other sx, SOB, etc... Discussed lasix use and she states that she thought that pt was taking lasix 40mg  BID, she will discuss with pt and call back with info updated on lasix use.

## 2017-06-21 NOTE — Telephone Encounter (Signed)
[email or web address]-[facility]

## 2017-06-21 NOTE — Telephone Encounter (Signed)
S/w [facility] she states that pt has been taking lasix 40mg  daily and 40mg  as needed for swelling. He will start taking lasix 40mg  prn swelling-only starting tomorrow

## 2017-06-21 NOTE — Telephone Encounter (Signed)
agree  Corine ShelterLUKE Ingram Onnen PA-C 06/21/2017 4:53 PM

## 2017-06-21 NOTE — Telephone Encounter (Signed)
Spoke with Gray[facility] ElectricBeth. She wanted to know if the patient could wean himself off of oxygen at night. She stated that he does not wear the o2 during the day. Advised her that he had not been seen by RA, but he had an appt with him in October. She wanted the patient to have a sooner appt, scheduled for 9/20 at 11am. Advised that I could send the message to RA to see what he thinks.   RA, is ok for him to wean himself off of oxygen? Per Burtr[facility]Beth, he states he feels fine without it. Please advise.

## 2017-06-22 NOTE — Telephone Encounter (Signed)
RA please advise. thanks 

## 2017-06-24 NOTE — Telephone Encounter (Signed)
I have not seen this patient  Pl refer patient to whomever started him on O2. Can answer once he is seen by us Pl try to get him earlier appt if needed

## 2017-06-25 NOTE — Telephone Encounter (Signed)
[facility] She is aware of RA's response. Beth will check with pt's PCP about this matter until he can be seen by our office. Nothing further was needed.

## 2017-07-02 ENCOUNTER — Telehealth: Payer: Self-pay | Admitting: Cardiovascular Disease

## 2017-07-02 NOTE — Telephone Encounter (Signed)
New Message     Pt wants you to expunge some things off of his records he wants Dr Mayford Knifeurner and Boyce MediciBrittany Simmons taken off of his chart, if this is possible?

## 2017-07-02 NOTE — Telephone Encounter (Signed)
Returned call to patient. He wanted to know if something that Dr. Mayford Knifeurner & Robbie LisBrittainy Simmons, PA had put on his chart could be removed - advised that I do not think that is something that can be done. No further action needed.

## 2017-07-13 ENCOUNTER — Institutional Professional Consult (permissible substitution): Payer: Medicare Other | Admitting: Pulmonary Disease

## 2017-07-17 ENCOUNTER — Ambulatory Visit: Payer: Medicare Other | Admitting: Cardiovascular Disease

## 2017-07-18 ENCOUNTER — Other Ambulatory Visit: Payer: Self-pay

## 2017-07-18 MED ORDER — RANITIDINE HCL 150 MG PO TABS: 150.0000 mg | ORAL_TABLET | Freq: Two times a day (BID) | ORAL | 3 refills | Status: DC

## 2017-07-20 ENCOUNTER — Telehealth: Payer: Self-pay | Admitting: Cardiovascular Disease

## 2017-07-20 NOTE — Telephone Encounter (Signed)
[facility], they are telling pt they can not refill the med. Med list shows 30 day supply with 11 refills as of 06-06-17. Pt requesting [facility] and see what the problem is and call him when doe so he kows when to go pick up med-pls call pt [phone]

## 2017-07-20 NOTE — Telephone Encounter (Signed)
SW

## 2017-07-20 NOTE — Telephone Encounter (Signed)
This is Dr. [place]'s pt.  °

## 2017-07-20 NOTE — Telephone Encounter (Signed)
[facility]  he states that losartan is on the shelf waiting for him to p/u has been waiting for him since 07-17-17  Left detailed message to go to pharmacy and p/u

## 2017-07-23 ENCOUNTER — Other Ambulatory Visit: Payer: Self-pay | Admitting: *Deleted

## 2017-07-23 MED ORDER — ATORVASTATIN CALCIUM 40 MG PO TABS: 40.0000 mg | ORAL_TABLET | Freq: Every day | ORAL | 11 refills | Status: AC

## 2017-07-23 MED ORDER — LOSARTAN POTASSIUM 25 MG PO TABS: 25.0000 mg | ORAL_TABLET | Freq: Every day | ORAL | 11 refills | Status: AC

## 2017-09-06 ENCOUNTER — Encounter: Payer: Self-pay | Admitting: Pulmonary Disease

## 2017-09-06 ENCOUNTER — Institutional Professional Consult (permissible substitution): Payer: Medicare Other | Admitting: Pulmonary Disease

## 2017-09-06 ENCOUNTER — Ambulatory Visit (INDEPENDENT_AMBULATORY_CARE_PROVIDER_SITE_OTHER): Payer: Medicare Other | Admitting: Pulmonary Disease

## 2017-09-06 VITALS — BP 90/70 | HR 54 | Ht 63.0 in | Wt 189.6 lb

## 2017-09-06 DIAGNOSIS — R911 Solitary pulmonary nodule: Secondary | ICD-10-CM

## 2017-09-06 DIAGNOSIS — J449 Chronic obstructive pulmonary disease, unspecified: Secondary | ICD-10-CM | POA: Insufficient documentation

## 2017-09-06 DIAGNOSIS — J9601 Acute respiratory failure with hypoxia: Secondary | ICD-10-CM | POA: Diagnosis not present

## 2017-09-06 DIAGNOSIS — Z23 Encounter for immunization: Secondary | ICD-10-CM | POA: Diagnosis not present

## 2017-09-06 NOTE — Assessment & Plan Note (Signed)
oxygen levels look good-we will [facility] to discontinue oxygen.

## 2017-09-06 NOTE — Progress Notes (Signed)
Subjective:    Patient ID: Adrian Ramos, male    DOB: August 07, 1946, 71 y.o.   MRN: [id number]  HPI  Chief Complaint  Patient presents with  . Pneumonia Consult    Pt has horseness weekly for last year, with sore throat.  Pt has SOB with extersion, no wheezing. Pt had Turp surgery that did not go well, developed pnuemonia from the surgery. On O2 at night at 2 liters DME- [facility]   71 year old man status post elective TURP on 5/31, developed postoperative hypotension, atrial fibrillation/RVR and respiratory distress requiring BiPAP  He developed fever and infiltrates,was treated as hospital acquired pneumonia, but favor atelectasis/ bronchitis ? aspiration pneumonitis  PMH -PAF (on ASA only), and CHF (likely due to AF or ETOH abuse, EF recovered as he stopped drinking).  He smoked 5-6 cigars per day for the last 30 years he is a retired Counsellor  He feels like he has recovered completely and is back to baseline. He only uses the oxygen during sleep. He denies cough, does report some dyspnea on exertion. Atrial fibrillation is now controlled on amiodarone, Cardizem was stopped on his last office visit with cardiology 05/2017 due to low blood pressure  He did not desaturate on walking around the office today. Spirometry shows a ratio of 70, with FEV1 of 59% FVC of 58%   SIGNIFICANT EVENTS  5/31 bleeding after TURP requiring blood products and pressors. Admit to ICU  6/2 -  febrile  at 8pm and cultured and started on broad abx .  AFib RVR  and got amio bolus at 10pm and started on po amio  And since then worsening o2 need.>> bipap 6/5 Developed a low-grade temperature of transferred to the floor and went back into A. fib RVR-required amio bolus   09/2016 CT chest showed 2 mm left lower lobe nodule        Past Medical History:  Diagnosis Date  . Alcohol abuse    as of 05-08-2017 per pt in remission since 10/ 2017 down to 2 drinks per month (which is not remission)  .  Anxiety   . BPH (benign prostatic hyperplasia)   . Cardiomyopathy (HCC)    currrently ef 55-60% per echo 03-05-2017 up from previous echo 10-11-2016 35-40%  . Chronic back pain   . Complication of anesthesia 2007   POST-OP URINARY RETENTION  . Coronary artery disease CARDIOLOGIST-  DR [facility]   cardiac/coronary CT 10-16-2016-- score 97 ,  moderate CAD in LAD and mild CAD in RCA  . Foley catheter in place   . GERD (gastroesophageal reflux disease)   . History of urinary retention 2007   POST OP  (BPH W/ BLADDER OUTLET OBSTRUCTION)  . Hypertension   . IDA (iron deficiency anemia)   . Insomnia   . PAF (paroxysmal atrial fibrillation) (HCC) CARDIOLOGIST-  DR TIFFANY Noble   EPISODE 10/ 2017 FAILED CARDIOVERSION 10-12-2016 BUT CONVERTED TO NSR W/ AMIODORONE  . Pulmonary nodule, left 10/16/2016  . RBBB (right bundle branch block)   . Recurrent right inguinal hernia   . Systolic and diastolic CHF, chronic [facility])    cardiologist-  dr Duke Salvia ([place]) per 03-05-2017 , ef 55-60%  . Urinary retention      Review of Systems  Constitutional: Negative for fever and unexpected weight change.  HENT: Positive for dental problem and voice change. Negative for congestion, ear pain, nosebleeds, postnasal drip, rhinorrhea, sinus pressure, sneezing, sore throat and trouble swallowing.  Eyes: Negative for redness and itching.  Respiratory: Positive for shortness of breath. Negative for chest tightness and wheezing.   Cardiovascular: Positive for leg swelling. Negative for palpitations.  Gastrointestinal: Negative for nausea and vomiting.  Genitourinary: Positive for difficulty urinating and dysuria.  Musculoskeletal: Negative for joint swelling.  Skin: Negative for rash.  Allergic/Immunologic: Negative.  Negative for environmental allergies, food allergies and immunocompromised state.  Neurological: Negative for headaches.  Hematological: Does not bruise/bleed easily.    Psychiatric/Behavioral: Negative for dysphoric mood.       Objective:   Physical Exam Gen. Pleasant, obese, in no distress ENT - no lesions, no post nasal drip Neck: No JVD, no thyromegaly, no carotid bruits Lungs: no use of accessory muscles, no dullness to percussion, decreased without rales or rhonchi  Cardiovascular: Rhythm regular, heart sounds  normal, no murmurs or gallops, no peripheral edema Musculoskeletal: No deformities, no cyanosis or clubbing , no tremors        Assessment & Plan:

## 2017-09-06 NOTE — Assessment & Plan Note (Signed)
Since he is symptomatically improved, will defer bronchodilator therapy but observe over the next 6 months and reassess

## 2017-09-06 NOTE — Assessment & Plan Note (Signed)
  Favor benign but given his history of smoking will need one-year follow-up CT chest without contrast to follow-up on lung nodule noted in left lower lobe in October 2017

## 2017-09-06 NOTE — Patient Instructions (Addendum)
Your oxygen levels look good-we will [facility] to discontinue oxygen.  Spirometry shows decreased lung function.  CT chest without contrast to follow-up on lung nodule noted in left lower lobe in October 2017

## 2017-09-17 ENCOUNTER — Ambulatory Visit (INDEPENDENT_AMBULATORY_CARE_PROVIDER_SITE_OTHER): Payer: Medicare Other | Admitting: Cardiovascular Disease

## 2017-09-17 ENCOUNTER — Encounter: Payer: Self-pay | Admitting: Cardiovascular Disease

## 2017-09-17 VITALS — BP 115/69 | HR 73 | Ht 63.5 in | Wt 190.6 lb

## 2017-09-17 DIAGNOSIS — E7800 Pure hypercholesterolemia, unspecified: Secondary | ICD-10-CM

## 2017-09-17 DIAGNOSIS — I480 Paroxysmal atrial fibrillation: Secondary | ICD-10-CM | POA: Diagnosis not present

## 2017-09-17 DIAGNOSIS — I10 Essential (primary) hypertension: Secondary | ICD-10-CM

## 2017-09-17 DIAGNOSIS — Z5181 Encounter for therapeutic drug level monitoring: Secondary | ICD-10-CM | POA: Diagnosis not present

## 2017-09-17 LAB — LIPID PANEL
CHOL/HDL RATIO: 2.4 ratio (ref 0.0–5.0)
CHOLESTEROL TOTAL: 178 mg/dL (ref 100–199)
HDL: 75 mg/dL (ref >39)
LDL Calculated: 60 mg/dL (ref 0–99)
Triglycerides: 215 mg/dL — ABNORMAL HIGH (ref 0–149)
VLDL CHOLESTEROL CAL: 43 mg/dL — AB (ref 5–40)

## 2017-09-17 LAB — COMPREHENSIVE METABOLIC PANEL
ALBUMIN: 4.4 g/dL (ref 3.5–4.8)
ALK PHOS: 85 IU/L (ref 39–117)
ALT: 14 IU/L (ref 0–44)
AST: 18 IU/L (ref 0–40)
Albumin/Globulin Ratio: 1.5 (ref 1.2–2.2)
BUN / CREAT RATIO: 18 (ref 10–24)
BUN: 18 mg/dL (ref 8–27)
Bilirubin Total: 0.5 mg/dL (ref 0.0–1.2)
CALCIUM: 8.7 mg/dL (ref 8.6–10.2)
CO2: 30 mmol/L — AB (ref 20–29)
CREATININE: 0.98 mg/dL (ref 0.76–1.27)
Chloride: 93 mmol/L — ABNORMAL LOW (ref 96–106)
GFR, EST AFRICAN AMERICAN: 89 mL/min/{1.73_m2} (ref >59)
GFR, EST NON AFRICAN AMERICAN: 77 mL/min/{1.73_m2} (ref >59)
GLOBULIN, TOTAL: 2.9 g/dL (ref 1.5–4.5)
Glucose: 105 mg/dL — ABNORMAL HIGH (ref 65–99)
Potassium: 4.3 mmol/L (ref 3.5–5.2)
SODIUM: 136 mmol/L (ref 134–144)
Total Protein: 7.3 g/dL (ref 6.0–8.5)

## 2017-09-17 LAB — MAGNESIUM: MAGNESIUM: 1.8 mg/dL (ref 1.6–2.3)

## 2017-09-17 MED ORDER — APIXABAN 5 MG PO TABS: 5.0000 mg | ORAL_TABLET | Freq: Two times a day (BID) | ORAL | 5 refills | Status: DC

## 2017-09-17 MED ORDER — SOTALOL HCL 80 MG PO TABS: 80.0000 mg | ORAL_TABLET | Freq: Two times a day (BID) | ORAL | 5 refills | Status: DC

## 2017-09-17 NOTE — Progress Notes (Signed)
Cardiology Office Note   Date:  09/17/2017   ID:  DRAGAN TAMBURRINO, DOB 12-06-1946, MRN [id number]  PCP:  Martha Clan, MD  Cardiologist:   Chilton Si, MD   Chief Complaint  Patient presents with  . Follow-up    History of Present Illness: Adrian Ramos is a 71 y.o. male with non-obstructive CAD, paroxysmal atrial fibrillation, alcohol abuse, who presents for follow up. Adrian Ramos originally saw Adrian. Mayford Knife on 10/10/73 surgical clearance prior to inguinal hernia repair. At that appointment he was noted to be in atrial fibrillation with rapid ventricular response. He was also short of breath. He was admitted to [facility]. Echocardiogram that hospitalization revealed LVEF 35-40%. It was unclear whether his heart failure was due to atrial fibrillation with rapid ventricular response or alcohol abuse. He was started on Xarelto and underwent TEE/DCCV on 10/12/16.  This was unsuccessful and he went back into atrial fibrillation. He was subsequently started on amiodarone and spontaneously converted to sinus rhythm.  He had a [place] Myoview that revealed a large, fixed defect in the inferoseptal region. There was no inducible ischemia. He then had a cardiac CT that showed moderate coronary artery disease in the LAD and mild atherosclerosis in the RCA.  He was subsequently noted to be back in atrial fibrillation in clinic on 10/26/16.  His hemoglobin dropped from 14 to 11 and he was started on a PPI.  Xarelto was held.  Adrian Ramos had an echo 03/05/17 that revealed LVEF 55-60%. At his last clinic appointment carvedilol was discontinued due to bradycardia.  He had an episode of urinary retention 01/2017 underwent  TURP 05/16/2017.  He developed post-op atrial fibrillation with RVR and respiratory failure requiring BiPAP.  He converted to sinus rhythm and was started on diltiazem. That hospitalization was also complicated by pneumonia.  He followed up with Adrian Ramos on 6/20 and diltiazem  was discontinued due to hypotension.  Since being discharged he has been feeling well.  He denies any chest pain.  He had an episode of palpitations this morning that lasted approximately 5 minutes.  It was associated with shortness of breath.  He hasn't noted any lower extremity edema, orthopnea or PND.  He has followed up with Adrian Ramos and is no longer using home oxygen except occasionally at night.  He is scheduled for a 1 year follow up on a pulmonary nodule noted 09/2016.  Adrian Ramos saw his PCP 08/2017 and had a repeat CBC that showed his hgb was 13.6  He denies any recurrent melena.  He never had an EGD.  Adrian Ramos hasn't been exercsing much lately.  He occasionally walks around his neighborhood.   Past Medical History:  Diagnosis Date  . Alcohol abuse    as of 05-08-2017 per pt in remission since 10/ 2017 down to 2 drinks per month (which is not remission)  . Anxiety   . BPH (benign prostatic hyperplasia)   . Cardiomyopathy (HCC)    currrently ef 55-60% per echo 03-05-2017 up from previous echo 10-11-2016 35-40%  . Chronic back pain   . Complication of anesthesia 2007   POST-OP URINARY RETENTION  . Coronary artery disease CARDIOLOGIST-  [facility]   cardiac/coronary CT 10-16-2016-- score 97 ,  moderate CAD in LAD and mild CAD in RCA  . Foley catheter in place   . GERD (gastroesophageal reflux disease)   . History of urinary retention 2007   POST OP  (BPH W/ BLADDER  OUTLET OBSTRUCTION)  . Hypertension   . IDA (iron deficiency anemia)   . Insomnia   . PAF (paroxysmal atrial fibrillation) (HCC) CARDIOLOGIST-  Adrian Delshawn Ramos [place]   EPISODE 10/ 2017 FAILED CARDIOVERSION 10-12-2016 BUT CONVERTED TO NSR W/ AMIODORONE  . Pulmonary nodule, left 10/16/2016  . RBBB (right bundle branch block)   . Recurrent right inguinal hernia   . Systolic and diastolic CHF, chronic [facility])    cardiologist-  Adrian Ramos ([place]) per 03-05-2017 , ef 55-60%  . Urinary retention     Past  Surgical History:  Procedure Laterality Date  . BENIGN PENILE GROWTH REMOVED  1985  . CARDIAC CATHETERIZATION  2000   NORMAL  (FALSE POSITIVE STRESS TEST)    . CARDIOVASCULAR STRESS TEST  10-15-2016  Adrian Elmarie [facility] [place]    nuclear study w/  large fixed defect in the inferoseptal region, no inducable ischemia/  LVEF 36% and global hypokinesis  . CARDIOVERSION N/A 10/12/2016   Procedure: CARDIOVERSION;  Surgeon: Quintella Reichert, MD;  Location: [facility] END[facility];  Service: Cardiovascular;  Laterality: N/A;  . CATARACT EXTRACTION W/ INTRAOCULAR LENS  IMPLANT, BILATERAL Bilateral 06/2015-07/2015  . HEMATOMA EVACUATION N/A 05/17/2017   Procedure: EVACUATION HEMATOMA AND           CYSTOSCOPY AND CLOT EVACUATION AND FULGERATION;  Surgeon: Adrian Matar, MD;  Location: [facility];  Service: Urology;  Laterality: N/A;  . INGUINAL HERNIA REPAIR Right 07-19-2006  . KNEE ARTHROSCOPY  03/13/2012   Procedure: ARTHROSCOPY KNEE;  Surgeon: Loanne Drilling, MD;  [facility];  Service: Orthopedics;  Laterality: Left;  WITH DEBRIDEMENT medial meniscus  . REPAIR RECURRENT RIGHT INGUINAL HERNIA  12-03-2006  . TEE WITHOUT CARDIOVERSION N/A 10/12/2016   Procedure: TRANSESOPHAGEAL ECHOCARDIOGRAM (TEE);  Surgeon: Quintella Reichert, MD;  Location: [facility] ENDOSCOPY;  Service: Cardiovascular;  Laterality: N/A; EF 40-45%/ trivial AR & MR/ mild to moderate LAE/ no evidence of RA thrombus or appendage/ no evidence of vegetation TR or PR  . TRANSTHORACIC ECHOCARDIOGRAM  03/05/2017   normal LVF, ef 55-60%/ mild LAE/ trivial TR/  mild increase PASP  . TRANSURETHRAL RESECTION OF PROSTATE N/A 05/17/2017   Procedure: TRANSURETHRAL RESECTION OF THE PROSTATE (TURP);  Surgeon: Adrian Matar, MD;  Location[facility];  Service: Urology;  Laterality: N/A;     Current Outpatient Prescriptions  Medication Sig Dispense Refill  . acidophilus (RISAQUAD) CAPS capsule Take 1 capsule  by mouth daily.    . Ascorbic Acid (VITAMIN C) 1000 MG tablet Take 1,000 mg by mouth daily.    Marland Kitchen atorvastatin (LIPITOR) 40 MG tablet Take 1 tablet (40 mg total) by mouth daily at 6 PM. 30 tablet 11  . beta carotene 16109 UNIT capsule Take 10,000 Units by mouth daily.    . chlorhexidine (PERIDEX) 0.12 % solution Use as directed 10 mLs in the mouth or throat daily.   3  . Cholecalciferol (VITAMIN D3) 5000 units CAPS Take 5,000 Units by mouth daily.    . Clotrimazole 1 % OINT Apply 1 application topically 2 (two) times daily as needed (for itching/irritation).    . Coenzyme Q10 (COQ10) 100 MG CAPS Take 100 mg by mouth daily.    Marland Kitchen docusate sodium (COLACE) 100 MG capsule Take 100 mg by mouth daily.    . furosemide (LASIX) 40 MG tablet Take 1 tablet (40 mg total) by mouth daily as needed for edema. 60 tablet 3  . LORazepam (ATIVAN) 1 MG tablet Take  1 mg by mouth every 3 (three) hours as needed for anxiety.    Marland Kitchen losartan (COZAAR) 25 MG tablet Take 1 tablet (25 mg total) by mouth at bedtime. 30 tablet 11  . milk thistle 175 MG tablet Take 175 mg by mouth daily.    . Misc Natural Products (PROSTATE HEALTH) CAPS Take 1 capsule by mouth daily.     . Omega-3 Fatty Acids (OMEGA-3 PO) Take 950 mg by mouth daily.    . potassium chloride SA (K-DUR,KLOR-CON) 20 MEQ tablet Take 1 tablet (20 mEq) daily PRN if an extra lasix has been taken that day. 30 tablet 3  . ranitidine (ZANTAC) 150 MG tablet Take 1 tablet (150 mg total) by mouth 2 (two) times daily. 60 tablet 3  . traZODone (DESYREL) 100 MG tablet Take 200 mg by mouth at bedtime.     . vitamin B-12 (CYANOCOBALAMIN) 500 MCG tablet Take 500 mcg by mouth daily.    . vitamin E 400 UNIT capsule Take 400 Units by mouth daily.     Marland Kitchen apixaban (ELIQUIS) 5 MG TABS tablet Take 1 tablet (5 mg total) by mouth 2 (two) times daily. 60 tablet 5  . sotalol (BETAPACE) 80 MG tablet Take 1 tablet (80 mg total) by mouth 2 (two) times daily. 60 tablet 5   No current  facility-administered medications for this visit.     Allergies:   Iodinated diagnostic agents and Xarelto [rivaroxaban]    Social History:  The patient  reports that he quit smoking about 21 months ago. His smoking use included Cigars. He quit after 25.00 years of use. He quit smokeless tobacco use about 27 years ago. His smokeless tobacco use included Chew. He reports that he drinks alcohol. He reports that he does not use drugs.   Family History:  The patient's family history includes Anxiety disorder in his father; Cerebrovascular Accident in his mother; Depression in his father; Heart disease in his father; Hodgkin's lymphoma in his mother; Hypertension in his father.    ROS:  Please see the history of present illness.   Otherwise, review of systems are positive for insomnia.   All other systems are reviewed and negative.    PHYSICAL EXAM: VS:  BP 115/69   Pulse 73   Ht 5' 3.5" (1.613 m)   Wt 86.5 kg (190 lb 9.6 oz)   BMI 33.23 kg/m  , BMI Body mass index is 33.23 kg/m. GENERAL:  Well appearing HEENT: Pupils equal round and reactive, fundi not visualized, oral mucosa unremarkable NECK:  No jugular venous distention, waveform within normal limits, carotid upstroke brisk and symmetric, no bruits, no thyromegaly LYMPHATICS:  No cervical adenopathy LUNGS:  Clear to auscultation bilaterally HEART:  RRR.  PMI not displaced or sustained,S1 and S2 within normal limits, no S3, no S4, no clicks, no rubs, no murmurs ABD:  Flat, positive bowel sounds normal in frequency in pitch, no bruits, no rebound, no guarding, no midline pulsatile mass, no hepatomegaly, no splenomegaly EXT:  2 plus pulses throughout, no edema, no cyanosis no clubbing SKIN:  No rashes no nodules NEURO:  Cranial nerves II through XII grossly intact, motor grossly intact throughout PSYCH:  Cognitively intact, oriented to person place and time   EKG:  EKG is ordered today. The ekg ordered 11/14/16 demonstrates atrial  fibrillation.  Rate 92 bpm.  RBBB 01/18/17:  Sinus bradycardia.  Rate 50 bpm.  RBBB. 05/07/17: Sinus bradycardia. Rate 50 bpm. Right bundle branch block. 09/17/17: Sinus rhythm.  Rate 73 bpm.  RBBB.   Echo 03/05/17: Study Conclusions  - Left ventricle: The cavity size was normal. Wall thickness was   normal. Systolic function was normal. The estimated ejection   fraction was in the range of 55% to 60%. Wall motion was normal;   there were no regional wall motion abnormalities. Left   ventricular diastolic function parameters were normal. - Left atrium: The atrium was mildly dilated. - Atrial septum: No defect or patent foramen ovale was identified. - Pulmonary arteries: Systolic pressure was mildly increased. PA   peak pressure: 36 mm Hg (S).  ECHO: 10/11/16 - Left ventricle: The cavity size was normal. There was mild focal basal hypertrophy of the septum. Systolic function was moderately reduced. The estimated ejection fraction was in the range of 35% to 40%. Diffuse hypokinesis. - Aortic valve: Trileaflet; mildly thickened, mildly calcified leaflets. - Left atrium: The atrium was moderately dilated.  TEE/DCCV: 10/26 - Left ventricle: Systolic function was mildly to moderately reduced. The estimated ejection fraction was in the range of 40% to 45%. Wall motion was normal; there were no regional wall motion abnormalities. - Aortic valve: There was trivial regurgitation. - Left atrium: The atrium was mildly to moderately dilated. No evidence of thrombus in the atrial cavity or appendage. - Right atrium: The atrium was mildly dilated. No evidence of thrombus in the atrial cavity or appendage. - Tricuspid valve: No evidence of vegetation. - Pulmonic valve: No evidence of vegetation.  Recent Labs: 10/10/2016: TSH 1.170 05/20/2017: ALT 19 05/23/2017: Magnesium 2.1 05/24/2017: B Natriuretic Peptide 88.7 05/25/2017: BUN 7; Creatinine, Ser 0.59; Hemoglobin 7.9; Platelets  201; Potassium 3.8; Sodium 138   09/12/17: WBC 4.3, hgb 13.6, hct 42.5, platelet 134   Lipid Panel    Component Value Date/Time   CHOL 163 10/11/2016 0228   TRIG 76 10/11/2016 0228   HDL 49 10/11/2016 0228   CHOLHDL 3.3 10/11/2016 0228   VLDL 15 10/11/2016 0228   LDLCALC 99 10/11/2016 0228      Wt Readings from Last 3 Encounters:  09/17/17 86.5 kg (190 lb 9.6 oz)  09/06/17 86 kg (189 lb 9.6 oz)  06/06/17 80.3 kg (177 lb)      ASSESSMENT AND PLAN:  # Chronic systolic diastolic heart failure: Resolved.  LVEF 55-60% on echo 02/2017.  Continue furosemide and losartan.  He is not on a beta blocker 2/2 bradycardia.    # Paroxysmal atrial fibrillation:  Mr. Elena back in sinus rhythm. He would like to stop amiodarone to avoid the long-term side effects. We will hold amiodarone for 2 weeks. He will then start sotalol 80 mg twice daily. We will check the Presence of metabolic panel and a magnesium today. Given that he has not had any recurrent melena and his hemoglobin is stable, we will stop aspirin and start Eliquis 5 mg twice daily.  # Hyperlipidemia:  Mr. Frizell was encouraged to start exercising at least 30-40 minutes most days of the week. We will check lipids today.  Continue atorvastatin.  # Hypertension: BP well-controlled on losartan.      Current medicines are reviewe is d at length with the patient today.  The patient does not have concerns regarding medicines.  The following changes have been made:  Stop aspirin and amiodarone.  Start sotalol and Eliqus.  Labs/ tests ordered today include:   Orders Placed This Encounter  Procedures  . Lipid panel  . Comprehensive metabolic panel  . Magnesium  . EKG 12-Lead  Disposition:  Vanden Fawaz C. Duke Salvia, MD, [facility] in 3 months.     This note was written with the assistance of speech recognition software.  Please excuse any transcriptional errors.  Signed, Jannet Calip C. Duke Salvia, MD, [facility]  09/17/2017 8:52 AM    [facility]

## 2017-09-17 NOTE — Patient Instructions (Addendum)
Medication Instructions:  STOP AMIODARONE FOR 2 WEEKS THEN START SOTALOL 80 MG TWICE A DAY   STOP ASPIRIN   START ELIQUIS 5 MG TWICE A DAY TOMORROW  Labwork: LP/CMET/MAGNESIUM TODAY   Testing/Procedures: NONE  Follow-Up: Your physician recommends that you schedule a follow-up appointment in: 3 MONTH OV   If you need a refill on your cardiac medications before your next appointment, please call your pharmacy.

## 2017-09-20 ENCOUNTER — Telehealth: Payer: Self-pay | Admitting: Pulmonary Disease

## 2017-09-20 ENCOUNTER — Telehealth: Payer: Self-pay | Admitting: Cardiovascular Disease

## 2017-09-20 DIAGNOSIS — J9601 Acute respiratory failure with hypoxia: Secondary | ICD-10-CM

## 2017-09-20 NOTE — Telephone Encounter (Signed)
Instructions      Return in about 6 months (around 03/06/2018).  Your oxygen levels look good-we will ask [facility] to discontinue oxygen.  Spirometry shows decreased lung function.  CT chest without contrast to follow-up on lung nodule noted in left lower lobe in October 2017     Order placed to [facility], pt notified. Nothing further is needed.

## 2017-09-20 NOTE — Telephone Encounter (Signed)
°  Follow Up   Calling to follow up on most recent blood work results.  Also has some questions regarding prescribed medication Sotalol.   Please call.

## 2017-09-20 NOTE — Telephone Encounter (Signed)
Patient called to confirm he did not need to be hospitalized to start Sotalol, he had read 2 articles that indicated he needed to be. Advised patient he did not need be. Labs reviewed with patient but explained would call back after Dr Duke Salvia reviewed and gave recommendations.

## 2017-09-21 ENCOUNTER — Institutional Professional Consult (permissible substitution): Payer: Medicare Other | Admitting: Pulmonary Disease

## 2017-09-26 ENCOUNTER — Telehealth: Payer: Self-pay | Admitting: Cardiovascular Disease

## 2017-09-26 NOTE — Telephone Encounter (Signed)
New message    Pt is calling about medication.  Pt c/o medication issue:  1. Name of Medication: omega 3  2. How are you currently taking this medication (dosage and times per day)? 950 mg  3. Are you having a reaction (difficulty breathing--STAT)? no  4. What is your medication issue? Pt wants to know if the medication he bought will be ok to take or if he needs to buy another supplement.

## 2017-09-26 NOTE — Telephone Encounter (Signed)
Follow up ° ° ° °Patient returning call.  Please call °

## 2017-09-26 NOTE — Telephone Encounter (Signed)
Returned call to patient he stated he wanted to ask Dr.Dyer if ok to take Omega 3 fish oil 950 mg instead of 1000 mg.Stated he already has 950 mg.Advised I will send message to Dr.Maunaloa for advice.

## 2017-09-26 NOTE — Telephone Encounter (Signed)
Returned call to patient no answer.LMTC. 

## 2017-09-27 NOTE — Telephone Encounter (Signed)
That is fine 

## 2017-09-27 NOTE — Telephone Encounter (Signed)
Left detailed message, ok per DPR  

## 2017-10-15 ENCOUNTER — Ambulatory Visit (INDEPENDENT_AMBULATORY_CARE_PROVIDER_SITE_OTHER)
Admission: RE | Admit: 2017-10-15 | Discharge: 2017-10-15 | Disposition: A | Discharge status: Home / Self Care | Payer: Medicare Other | Source: Ambulatory Visit | Attending: Pulmonary Disease | Admitting: Pulmonary Disease

## 2017-10-15 DIAGNOSIS — R911 Solitary pulmonary nodule: Secondary | ICD-10-CM

## 2017-10-16 ENCOUNTER — Other Ambulatory Visit: Payer: Self-pay | Admitting: Gastroenterology

## 2017-10-18 ENCOUNTER — Telehealth: Payer: Self-pay | Admitting: Pulmonary Disease

## 2017-10-18 DIAGNOSIS — R590 Localized enlarged lymph nodes: Secondary | ICD-10-CM

## 2017-10-18 NOTE — Telephone Encounter (Signed)
Pt called back and I explained results and he has made appt to have PET scan.

## 2017-10-18 NOTE — Telephone Encounter (Signed)
Left detailed VM and ordered PET scan.   Notes recorded by Oretha MilchAlva, Rakesh V, MD on 10/15/2017 at 2:10 PM EDT Nodule has resolved but enlarged lymph nodes noted - cannot compare to cardiac CT from 2017 since that area was not scanned. Suggest proceed with PET scan which should clarify Schedule FU appt after PET scan to discuss

## 2017-10-23 ENCOUNTER — Encounter: Payer: Self-pay | Admitting: Pulmonary Disease

## 2017-10-30 ENCOUNTER — Ambulatory Visit (HOSPITAL_COMMUNITY)
Admission: RE | Admit: 2017-10-30 | Discharge: 2017-10-30 | Disposition: A | Discharge status: Home / Self Care | Payer: Medicare Other | Source: Ambulatory Visit | Attending: Pulmonary Disease | Admitting: Pulmonary Disease

## 2017-10-30 DIAGNOSIS — Z79899 Other long term (current) drug therapy: Secondary | ICD-10-CM | POA: Diagnosis not present

## 2017-10-30 DIAGNOSIS — J398 Other specified diseases of upper respiratory tract: Secondary | ICD-10-CM | POA: Insufficient documentation

## 2017-10-30 DIAGNOSIS — R590 Localized enlarged lymph nodes: Secondary | ICD-10-CM | POA: Insufficient documentation

## 2017-10-30 LAB — GLUCOSE, CAPILLARY: GLUCOSE-CAPILLARY: 106 mg/dL — AB (ref 65–99)

## 2017-10-30 MED ORDER — FLUDEOXYGLUCOSE F - 18 (FDG) INJECTION
9.3000 | Freq: Once | INTRAVENOUS | Status: AC | PRN
  Administered 2017-10-30: 9.3 via INTRAVENOUS

## 2017-11-05 ENCOUNTER — Encounter: Payer: Self-pay | Admitting: Pulmonary Disease

## 2017-11-05 ENCOUNTER — Ambulatory Visit: Payer: Medicare Other | Admitting: Pulmonary Disease

## 2017-11-05 VITALS — BP 116/74 | HR 42 | Ht 63.5 in | Wt 203.0 lb

## 2017-11-05 DIAGNOSIS — J351 Hypertrophy of tonsils: Secondary | ICD-10-CM

## 2017-11-05 DIAGNOSIS — R911 Solitary pulmonary nodule: Secondary | ICD-10-CM

## 2017-11-05 DIAGNOSIS — R590 Localized enlarged lymph nodes: Secondary | ICD-10-CM | POA: Insufficient documentation

## 2017-11-05 NOTE — Progress Notes (Signed)
   Subjective:    Patient ID: Adrian Ramos, male    DOB: 10/11/1946, 71 y.o.   MRN: [id number]  HPI  71 year old man status post elective TURP on 5/31, developed postoperative hypotension, atrial fibrillation/RVR and respiratory distress requiring BiPAP  He developed fever and infiltrates,was treated as hospital acquired pneumonia, but favor atelectasis/ bronchitis ? aspiration pneumonitis  PMH -PAF (on ASA only), and CHF (likely due to AF or ETOH abuse, EF recovered as he stopped drinking).  He smoked 5-6 cigars per day for the last 30 years he is a retired Comptrollerclinical psychologist  Breathing wise, he feels back to baseline. Follow-up CT chest showed mediastinal paratracheal lymphadenopathy.  PET scan was negative and identified this to be cystic?  Bronchogenic cyst. Left tonsillar enlargement, hypermetabolic was noted. We discussed these results extensively and other findings noted on the report     SIGNIFICANT EVENTS  5/31 bleeding after TURP requiring blood products and pressors. Admit to ICU post op developed Af-RVR, with fever  09/2016 CT chest showed 2 mm left lower lobe nodule  09/2017 CT chest showed stable calcified granuloma left lower lobe, mediastinal low-attenuation mass, PET negative  Spirometry 09/2017  ratio of 70, with FEV1 of 59% FVC of 58%  Review of Systems neg for any significant sore throat, dysphagia, itching, sneezing, nasal congestion or excess/ purulent secretions, fever, chills, sweats, unintended wt loss, pleuritic or exertional cp, hempoptysis, orthopnea pnd or change in chronic leg swelling. Also denies presyncope, palpitations, heartburn, abdominal pain, nausea, vomiting, diarrhea or change in bowel or urinary habits, dysuria,hematuria, rash, arthralgias, visual complaints, headache, numbness weakness or ataxia.     Objective:   Physical Exam   Gen. Pleasant, well-nourished, in no distress ENT - no thrush, no post nasal drip Neck: No JVD, no  thyromegaly, no carotid bruits Lungs: no use of accessory muscles, no dullness to percussion, clear without rales or rhonchi  Cardiovascular: Rhythm regular, heart sounds  normal, no murmurs or gallops, no peripheral edema Musculoskeletal: No deformities, no cyanosis or clubbing         Assessment & Plan:

## 2017-11-05 NOTE — Patient Instructions (Signed)
ENT consult for tonsillar enlargement noted on PET scan.  CT chest with contrast in 1 year and follow-up after

## 2017-11-05 NOTE — Addendum Note (Signed)
Addended by: Maurene CapesPOTTS, Jhada Risk M on: 11/05/2017 02:04 PM   Modules accepted: Orders

## 2017-11-05 NOTE — Assessment & Plan Note (Signed)
Stable- benign

## 2017-11-05 NOTE — Assessment & Plan Note (Addendum)
Cystic on PET negative  ENT consult for tonsillar enlargement noted on PET scan.  CT chest with contrast in 1 year and follow-up after
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
## 2017-12-20 ENCOUNTER — Ambulatory Visit: Payer: Medicare Other | Admitting: Cardiovascular Disease

## 2017-12-20 ENCOUNTER — Encounter: Payer: Self-pay | Admitting: Cardiovascular Disease

## 2017-12-20 DIAGNOSIS — I10 Essential (primary) hypertension: Secondary | ICD-10-CM

## 2017-12-20 DIAGNOSIS — I5042 Chronic combined systolic (congestive) and diastolic (congestive) heart failure: Secondary | ICD-10-CM | POA: Diagnosis not present

## 2017-12-20 DIAGNOSIS — I480 Paroxysmal atrial fibrillation: Secondary | ICD-10-CM

## 2017-12-20 DIAGNOSIS — E7800 Pure hypercholesterolemia, unspecified: Secondary | ICD-10-CM | POA: Diagnosis not present

## 2017-12-20 NOTE — Patient Instructions (Signed)
Medication Instructions:  Your physician recommends that you continue on your current medications as directed. Please refer to the Current Medication list given to you today.  Labwork: none  Testing/Procedures: none  Follow-Up: Your physician recommends that you schedule a follow-up appointment in: 6 month ov  If you need a refill on your cardiac medications before your next appointment, please call your pharmacy.  

## 2018-01-29 ENCOUNTER — Other Ambulatory Visit: Payer: Self-pay | Admitting: Gastroenterology

## 2018-01-29 NOTE — Telephone Encounter (Signed)
Pt last seen 11-2016 by Victorino DikeJennifer, does not have a follow up appt.  Is it Ok to refill ranitidine? Thanks.

## 2018-01-29 NOTE — Telephone Encounter (Signed)
Yes that's fine, thanks

## 2018-03-28 ENCOUNTER — Telehealth: Payer: Self-pay | Admitting: Cardiovascular Disease

## 2018-03-28 NOTE — Telephone Encounter (Signed)
[facility], Appt 06/03/18 @ 9:00. NV

## 2018-04-06 ENCOUNTER — Other Ambulatory Visit: Payer: Self-pay | Admitting: Cardiovascular Disease

## 2018-04-08 ENCOUNTER — Telehealth: Payer: Self-pay | Admitting: Cardiovascular Disease

## 2018-04-08 NOTE — Telephone Encounter (Signed)
New Message  Pt states he is returning call for nurse, states that a message can be sent to my chart

## 2018-04-08 NOTE — Telephone Encounter (Signed)
Refill Request.  

## 2018-04-08 NOTE — Telephone Encounter (Signed)
Result note sent to MyChart. Advised to call with questions or concerns.

## 2018-05-18 DEATH — deceased

## 2018-06-03 ENCOUNTER — Ambulatory Visit: Payer: Medicare Other | Admitting: Cardiovascular Disease
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
## 2018-06-04 ENCOUNTER — Encounter: Payer: Self-pay | Admitting: Cardiovascular Disease

## 2018-06-12 ENCOUNTER — Telehealth: Payer: Self-pay | Admitting: Cardiovascular Disease

## 2018-06-12 NOTE — Telephone Encounter (Signed)
New Message:     Leonette MostCharles called and wanted Dr Duke Salviaandolph and all necessary people to know that pt expired on .

## 2018-06-12 NOTE — Telephone Encounter (Signed)
That is so unfortunate.  Thank you for letting me know.
